# Patient Record
Sex: Female | Born: 1940 | Race: White | Hispanic: No | Marital: Married | Smoking: Former smoker
Health system: Southern US, Community
[De-identification: ages and names within clinical notes are randomized; demographics above are authoritative.]

## PROBLEM LIST (undated history)

## (undated) DIAGNOSIS — J45909 Unspecified asthma, uncomplicated: Secondary | ICD-10-CM

## (undated) DIAGNOSIS — Z8489 Family history of other specified conditions: Secondary | ICD-10-CM

## (undated) DIAGNOSIS — T4145XA Adverse effect of unspecified anesthetic, initial encounter: Secondary | ICD-10-CM

## (undated) DIAGNOSIS — M329 Systemic lupus erythematosus, unspecified: Secondary | ICD-10-CM

## (undated) DIAGNOSIS — E039 Hypothyroidism, unspecified: Secondary | ICD-10-CM

## (undated) DIAGNOSIS — K219 Gastro-esophageal reflux disease without esophagitis: Secondary | ICD-10-CM

## (undated) DIAGNOSIS — E785 Hyperlipidemia, unspecified: Secondary | ICD-10-CM

## (undated) DIAGNOSIS — I714 Abdominal aortic aneurysm, without rupture, unspecified: Secondary | ICD-10-CM

## (undated) DIAGNOSIS — R112 Nausea with vomiting, unspecified: Secondary | ICD-10-CM

## (undated) DIAGNOSIS — T8859XA Other complications of anesthesia, initial encounter: Secondary | ICD-10-CM

## (undated) DIAGNOSIS — IMO0001 Reserved for inherently not codable concepts without codable children: Secondary | ICD-10-CM

## (undated) DIAGNOSIS — Z9889 Other specified postprocedural states: Secondary | ICD-10-CM

## (undated) DIAGNOSIS — J449 Chronic obstructive pulmonary disease, unspecified: Secondary | ICD-10-CM

## (undated) DIAGNOSIS — M479 Spondylosis, unspecified: Secondary | ICD-10-CM

## (undated) DIAGNOSIS — IMO0002 Reserved for concepts with insufficient information to code with codable children: Secondary | ICD-10-CM

## (undated) DIAGNOSIS — G709 Myoneural disorder, unspecified: Secondary | ICD-10-CM

## (undated) HISTORY — PX: TONSILLECTOMY AND ADENOIDECTOMY: SUR1326

## (undated) HISTORY — DX: Hyperlipidemia, unspecified: E78.5

## (undated) HISTORY — DX: Abdominal aortic aneurysm, without rupture, unspecified: I71.40

## (undated) HISTORY — DX: Spondylosis, unspecified: M47.9

## (undated) HISTORY — DX: Abdominal aortic aneurysm, without rupture: I71.4

## (undated) HISTORY — PX: JOINT REPLACEMENT: SHX530

## (undated) HISTORY — DX: Unspecified asthma, uncomplicated: J45.909

## (undated) HISTORY — DX: Chronic obstructive pulmonary disease, unspecified: J44.9

## (undated) HISTORY — PX: CATARACT EXTRACTION: SUR2

## (undated) HISTORY — DX: Systemic lupus erythematosus, unspecified: M32.9

## (undated) HISTORY — PX: ABDOMINAL HYSTERECTOMY: SHX81

---

## 1997-08-30 ENCOUNTER — Other Ambulatory Visit: Admission: RE | Admit: 1997-08-30 | Discharge: 1997-08-30 | Payer: Self-pay | Admitting: Dermatology

## 1998-10-24 ENCOUNTER — Ambulatory Visit (HOSPITAL_COMMUNITY): Admission: RE | Admit: 1998-10-24 | Discharge: 1998-10-24 | Payer: Self-pay | Admitting: Family Medicine

## 1998-10-24 ENCOUNTER — Encounter: Payer: Self-pay | Admitting: Family Medicine

## 2000-05-11 HISTORY — PX: BACK SURGERY: SHX140

## 2000-05-20 ENCOUNTER — Encounter: Payer: Self-pay | Admitting: Specialist

## 2000-05-20 ENCOUNTER — Observation Stay (HOSPITAL_COMMUNITY): Admission: RE | Admit: 2000-05-20 | Discharge: 2000-05-22 | Payer: Self-pay | Admitting: Specialist

## 2000-07-30 ENCOUNTER — Encounter: Payer: Self-pay | Admitting: Neurosurgery

## 2000-07-30 ENCOUNTER — Ambulatory Visit (HOSPITAL_COMMUNITY): Admission: RE | Admit: 2000-07-30 | Discharge: 2000-07-30 | Payer: Self-pay | Admitting: Neurosurgery

## 2000-08-13 ENCOUNTER — Encounter: Payer: Self-pay | Admitting: Neurosurgery

## 2000-08-13 ENCOUNTER — Encounter: Admission: RE | Admit: 2000-08-13 | Discharge: 2000-08-13 | Payer: Self-pay | Admitting: Neurosurgery

## 2000-08-27 ENCOUNTER — Encounter: Payer: Self-pay | Admitting: Neurosurgery

## 2000-08-27 ENCOUNTER — Encounter: Admission: RE | Admit: 2000-08-27 | Discharge: 2000-08-27 | Payer: Self-pay | Admitting: Neurosurgery

## 2000-09-06 ENCOUNTER — Encounter: Admission: RE | Admit: 2000-09-06 | Discharge: 2000-09-06 | Payer: Self-pay | Admitting: Family Medicine

## 2000-09-06 ENCOUNTER — Encounter: Payer: Self-pay | Admitting: Family Medicine

## 2000-09-23 ENCOUNTER — Encounter: Payer: Self-pay | Admitting: Family Medicine

## 2000-09-23 ENCOUNTER — Ambulatory Visit (HOSPITAL_COMMUNITY): Admission: RE | Admit: 2000-09-23 | Discharge: 2000-09-23 | Payer: Self-pay | Admitting: Family Medicine

## 2000-09-28 ENCOUNTER — Encounter: Payer: Self-pay | Admitting: Family Medicine

## 2000-09-28 ENCOUNTER — Encounter: Admission: RE | Admit: 2000-09-28 | Discharge: 2000-09-28 | Payer: Self-pay | Admitting: Family Medicine

## 2000-10-22 ENCOUNTER — Encounter: Admission: RE | Admit: 2000-10-22 | Discharge: 2000-10-22 | Payer: Self-pay | Admitting: Neurosurgery

## 2000-10-22 ENCOUNTER — Encounter: Payer: Self-pay | Admitting: Neurosurgery

## 2000-11-30 ENCOUNTER — Inpatient Hospital Stay (HOSPITAL_COMMUNITY): Admission: AD | Admit: 2000-11-30 | Discharge: 2000-12-03 | Payer: Self-pay | Admitting: Neurosurgery

## 2000-11-30 ENCOUNTER — Encounter: Payer: Self-pay | Admitting: Neurosurgery

## 2000-12-13 ENCOUNTER — Encounter: Payer: Self-pay | Admitting: Neurosurgery

## 2000-12-13 ENCOUNTER — Encounter: Admission: RE | Admit: 2000-12-13 | Discharge: 2000-12-13 | Payer: Self-pay | Admitting: Neurosurgery

## 2001-04-04 ENCOUNTER — Encounter: Admission: RE | Admit: 2001-04-04 | Discharge: 2001-05-23 | Payer: Self-pay | Admitting: Neurosurgery

## 2001-08-03 ENCOUNTER — Ambulatory Visit (HOSPITAL_COMMUNITY): Admission: RE | Admit: 2001-08-03 | Discharge: 2001-08-03 | Payer: Self-pay | Admitting: Gastroenterology

## 2001-08-10 ENCOUNTER — Encounter: Admission: RE | Admit: 2001-08-10 | Discharge: 2001-08-10 | Payer: Self-pay

## 2001-09-26 ENCOUNTER — Encounter: Admission: RE | Admit: 2001-09-26 | Discharge: 2001-09-26 | Payer: Self-pay | Admitting: Neurosurgery

## 2001-09-26 ENCOUNTER — Encounter: Payer: Self-pay | Admitting: Neurosurgery

## 2002-10-26 ENCOUNTER — Emergency Department (HOSPITAL_COMMUNITY): Admission: EM | Admit: 2002-10-26 | Discharge: 2002-10-26 | Payer: Self-pay

## 2002-11-21 ENCOUNTER — Encounter: Admission: RE | Admit: 2002-11-21 | Discharge: 2002-11-21 | Payer: Self-pay | Admitting: Family Medicine

## 2002-11-21 ENCOUNTER — Encounter: Payer: Self-pay | Admitting: Family Medicine

## 2004-01-31 ENCOUNTER — Ambulatory Visit (HOSPITAL_COMMUNITY): Admission: RE | Admit: 2004-01-31 | Discharge: 2004-01-31 | Payer: Self-pay | Admitting: Neurosurgery

## 2004-04-09 ENCOUNTER — Ambulatory Visit (HOSPITAL_COMMUNITY): Admission: RE | Admit: 2004-04-09 | Discharge: 2004-04-09 | Payer: Self-pay | Admitting: *Deleted

## 2005-07-13 ENCOUNTER — Ambulatory Visit (HOSPITAL_COMMUNITY): Admission: RE | Admit: 2005-07-13 | Discharge: 2005-07-13 | Payer: Self-pay | Admitting: Family Medicine

## 2005-10-08 ENCOUNTER — Other Ambulatory Visit: Admission: RE | Admit: 2005-10-08 | Discharge: 2005-10-08 | Payer: Self-pay | Admitting: Obstetrics and Gynecology

## 2006-11-01 ENCOUNTER — Ambulatory Visit (HOSPITAL_COMMUNITY): Admission: RE | Admit: 2006-11-01 | Discharge: 2006-11-01 | Payer: Self-pay | Admitting: Family Medicine

## 2007-08-03 ENCOUNTER — Encounter: Admission: RE | Admit: 2007-08-03 | Discharge: 2007-08-03 | Payer: Self-pay | Admitting: Neurosurgery

## 2007-08-26 ENCOUNTER — Encounter: Admission: RE | Admit: 2007-08-26 | Discharge: 2007-08-26 | Payer: Self-pay | Admitting: Orthopedic Surgery

## 2008-05-07 ENCOUNTER — Other Ambulatory Visit: Admission: RE | Admit: 2008-05-07 | Discharge: 2008-05-07 | Payer: Self-pay | Admitting: Obstetrics and Gynecology

## 2009-02-28 ENCOUNTER — Ambulatory Visit (HOSPITAL_COMMUNITY): Admission: RE | Admit: 2009-02-28 | Discharge: 2009-02-28 | Payer: Self-pay | Admitting: Obstetrics and Gynecology

## 2009-12-19 ENCOUNTER — Encounter: Admission: RE | Admit: 2009-12-19 | Discharge: 2009-12-19 | Payer: Self-pay | Admitting: Neurosurgery

## 2010-03-27 ENCOUNTER — Ambulatory Visit (HOSPITAL_COMMUNITY): Admission: RE | Admit: 2010-03-27 | Discharge: 2010-03-27 | Payer: Self-pay | Admitting: Family Medicine

## 2010-05-23 ENCOUNTER — Ambulatory Visit
Admission: RE | Admit: 2010-05-23 | Discharge: 2010-05-23 | Payer: Self-pay | Source: Home / Self Care | Attending: Vascular Surgery | Admitting: Vascular Surgery

## 2010-05-23 ENCOUNTER — Ambulatory Visit: Admit: 2010-05-23 | Payer: Self-pay | Admitting: Vascular Surgery

## 2010-05-31 NOTE — Procedures (Unsigned)
DUPLEX ULTRASOUND OF ABDOMINAL AORTA  INDICATION:  Aortic dilatation.  HISTORY: Diabetes:  No. Cardiac:  No. Hypertension:  No. Smoking:  No. Connective Tissue Disorder: Family History:  No. Previous Surgery:  No. Other:  Hyperlipidemia.  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  ___________________________________________ Fransisco Hertz, MD  EM/MEDQ  D:  05/23/2010  T:  05/23/2010  Job:  [id number]

## 2010-06-01 ENCOUNTER — Encounter: Payer: Self-pay | Admitting: Neurosurgery

## 2010-09-23 NOTE — Assessment & Plan Note (Signed)
OFFICE VISIT   Janet Giles, Janet Giles A  DOB:  24-Jan-1941                                       05/23/2010  ZOXWR#:[id number]   This is a new patient consultation.  The referral was from Dr. Trey Sailors.  The patient's primary care physician is Dr. Clovis Riley with [facility].   REASON FOR CONSULTATION:  Abdominal aortic enlargement.   HISTORY OF PRESENT ILLNESS:  This is a 70 year old female who presents  with chief complaint of pelvic pain.  She was apparently being evaluated  by Dr. Channing Mutters with [facility] for degenerative  disease in her back and had obtained an MRI.  This demonstrated a  dilatation in the aorta of 2.8 cm.  The patient notes her pain is  described as a vague sense of pain and pressure in her buttocks.  She  denies any frank pain radiating from her abdomen or her back per se.  Does note the previous history of fusion at the L4-L5 level and has also  previously undergone a partial hysterectomy.  She has no known family  history of aneurysmal disease and there is no history of connective  tissue disorders.  Her aneurysmal risk factors include her elevated age  of 78.  She also has some hyperlipidemia but does not have hypertension  and quit smoking in 2001.   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   PAST SURGICAL HISTORY:  She has had a tonsillectomy, adenoidectomy,  partial hysterectomy and then a fusion of L4-L5.   SOCIAL HISTORY:  She quit smoking in 2001 but had prior to that a 40  pack year history of smoking.  Denies any alcohol or illicit drug use.   FAMILY HISTORY:  Her father died at 6 of unknown reasons.  Mother had  diabetes, Alzheimer's and coronary artery disease and died related to  heart complications.  She had a brother and sister that both had  coronary artery disease and myocardial infarctions.   MEDICATIONS:  Included Estrace, lovastatin, gabapentin, Synthroid,  hydrocodone, calcium with vitamin D.   ALLERGIES:  No known drug allergies listed on the chart.   REVIEW OF SYSTEMS:  Demonstrated removal of varicose veins in both legs,  arthritis joint pain and lupus.  Otherwise the rest of her review of  systems was noted on the chart to be negative.   PHYSICAL EXAMINATION:  Vital signs:  Blood pressure of 125/77, heart  rate of 69, respirations 12, satting 98% on room air.  General:  Well-developed, well-nourished, no apparent distress, alert  and oriented x3.  Head:  Normocephalic, atraumatic.  ENT:  Oropharynx without any erythema or exudate.  Nares with erythema  without drainage.  Hearing grossly intact.  Eyes:  Pupils were equal, round, reactive to light.  Extraocular  movements were intact.  Neck:  Had a supple neck with no nuchal rigidity.  There was no obvious  lymphadenopathy.  Pulmonary:  Symmetric expansion, good air movement.  No rales, rhonchi  or wheezing.  Cardiac:  Regular rate and rhythm.  Normal S1-S2.  No murmurs, rubs,  thrills or gallops.  Vascular:  She had easily palpable pulses throughout all extremities.  Carotid bruits were not present.  The aorta was palpable and within  normal limits.  Abdomen:  Soft abdomen, nontender, no guarding or rebound, no  hepatosplenomegaly.  No obvious masses.  Musculoskeletal:  She had 5/5 strength in all extremities.  Both lower  legs demonstrate multiple spider veins but no frank ulcers or any type  of varicosities.  There is also no sign of any type of gangrene.  Neuro:  Motor strength was as above.  Cranial nerves II-XII were intact.  Sensation grossly intact all extremities.  Psychiatric:  Judgment was intact.  Mood and affect were appropriate for  clinical situation.  Skin:  The extremities were as listed above.  Otherwise I did not note  any specific rashes.  Lymphatic:  There was no cervical, axillary or  inguinal lymphadenopathy.   I reviewed about 5 pages from the referral and also reviewed her MRI.  It does demonstrate an area of ectasia in the aorta with no signs  concerning for possible rupture or any type of inflammatory changes.   She also had noninvasive vascular imaging completed.  A duplex  ultrasound abdominal aorta demonstrates maximal diameter distally at 2.6  x 2.4 cm.   MEDICAL DECISION MAKING:  This is a 70 year old female who presents with  primary complaint that sounds to be like a chronic pelvic pain syndrome.  In discussing in depth, her medical history with her, it appears she has  gone through a significant amount of workup already for this.  Given her  also previous history of what sounds like endovenous ablation of her  saphenous vein there is a possibility she may have actually incompetent  pelvic veins that may be accounting for some type of pelvic congestion  syndrome that would be a possible explanation for her chronic pelvic  pain.  The normal study for this would be an MRV or a venogram.  I am  going to have to investigate further whether or not this particular  study can be easily obtained in [place].  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  She would be  on a q.2 year surveillance interval for her particular size.  I talked  with her the risk of complications from this aortic ectasia would be  less than 1% risk of rupture in a year which is far less than the risk  of any type of intervention at this point.  So I will investigate  further whether or not there is an option in terms of further  investigation of her pelvic veins and I will give her a call and try to  schedule either MRV or a pelvic venogram for this patient depending on  availability of the MRV.   I appreciate being given the opportunity to participate in this  patient's care.     Fransisco Hertz, MD  Electronically Signed   BLC/MEDQ  D:   05/23/2010  T:  05/23/2010  Job:  [id number]   cc:   L. Lupe Carney, M.D.  Payton Doughty, M.D.

## 2010-09-26 NOTE — H&P (Signed)
[place]  Patient:    Janet Giles, Janet Giles                      MRN: [id number] Adm. Date:  11/30/00 Attending:  Payton Doughty, M.D.                         History and Physical  ADMISSION DIAGNOSIS:  Lumbar spondylosis.  HISTORY:  This 70 year old, right-handed white female had back pain several years ago.  She participated in an Advil regimen with some resolution.  Last July she was sitting in a chair and had her back start feeling badly.  She got out of the chair and has been feeling bad ever since.  She has low back pain, not particularly activity-related.  It gets down in her legs, and it is hard for her stand in one place.  She has numbness in both feet.  She had been tried with an EMG and nerve conduction study.  This did not demonstrate any hard findings.  Had a seven block which did reproduce some of her pain.  It did not help her much.  Had a myelogram which showed mild narrowing at L5-S1 and an MRI which had similar findings.  She was referred to me.  I visited with her, and tried epidural steroids which were unhelpful.  Based on that a discography was obtained at L4-5 and L5-S1, and she had strongly concordant pain response to pressure, characteristic of degenerative disease. She is therefore admitted for a lumbar fusion.  PAST MEDICAL HISTORY:  Unremarkable for significant diseases.  PAST SURGICAL HISTORY:  She had a tonsillectomy in 1952, a hysterectomy in 1980, a ganglion cyst on her wrist in 1990.  CURRENT MEDICATIONS:  She is currently taking Vicodin t.i.d., Estrace once a day, and Advil six a day for back pain.  ALLERGIES:  No known drug allergies.  SOCIAL HISTORY:  She smokes one pack of cigarettes a day and drinks rarely on a social basis.  FAMILY HISTORY:  Mom died at age 63 of diabetes mellitus and heart disease. Daddy died at age 12 with ulcer disease.  REVIEW OF SYSTEMS:  Remarkable for wearing glasses, back pain, leg  pain, cutaneous lupus.  PHYSICAL EXAMINATION:  HEENT:  Within normal limits.  NECK:  She has a reasonably good range of motion in her neck.  CHEST:  A few crackles.  CARDIAC:  A regular rate and rhythm.  ABDOMEN:  Nontender with no hepatosplenomegaly.  EXTREMITIES:  Without clubbing or cyanosis.  GENITOURINARY:  Deferred.  PERIPHERAL PULSES:  Good.  NEUROLOGIC:  She is awake, alert, and oriented.  Her pupils are equal, round, reactive to light.  Extraocular movements intact.  Facial movement and sensation intact.  Tongue in the midline.  Speech is normal.  Describes no swallowing difficulties.  Shoulder shrug is normal.  Motor examination shows 5/5 strength throughout the upper and lower extremities without a sensory deficit when seated.  When she stands she gets numbness in both feet in a strong dermatomal pattern of L5 and S1.  Reflexes 2 at the knees, 1 at the ankles.  Straight leg raising bilaterally positive.  She has degenerative changes at L5-S1 with marked narrowing of the disk space, facet arthropathy, and an MRI demonstrates biforaminal narrowing at L5-S1, bowing both 5 roots.  A discography has been reviewed above.  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  PLAN:  A  lumbar laminectomy, diskectomy, posterior lumbar interbody fusion with a Ray threaded fusion cage.  The risks and benefits of this approach have been discussed with her, and she wishes to proceed. DD:  11/30/00 TD:  11/30/00 Job: [id number]

## 2010-09-26 NOTE — Op Note (Signed)
[place]. [facility]  Patient:    Janet Giles, Janet Giles                      MRN: [id number] Proc. Date: 11/30/00 Attending:  Payton Doughty, M.D.                           Operative Report  PREOPERATIVE DIAGNOSIS:  Spondylosis L4-5, L5-S1.  POSTOPERATIVE DIAGNOSIS:  Spondylosis L4-5, L5-S1.  OPERATION PERFORMED:  L4-5, L5-S1 laminectomy and diskectomy, posterior lumbar fusion with Ray threaded fusion cage.  SURGEON:  Payton Doughty, M.D.  ANESTHESIA:  General endotracheal.  PREP:  Sterile Betadine prep and scrub with alcohol wipe.  COMPLICATIONS:  None.  ASSISTANT:  Kyle L. Franky Macho, M.D.  INDICATIONS FOR PROCEDURE:  The patient is a 70 year old right-handed white female with severe spondylosis at L4-5 and L5-S1.  DESCRIPTION OF PROCEDURE:  The patient was taken to the operating room, smoothly anesthetized and intubated, and placed prone on the operating table. Following shave, prep and drape in the usual sterile fashion, the skin was infiltrated with 1% lidocaine 1:400,000 epinephrine.  The skin was incised from the top of L4 to the bottom of S1 and the lamina of L4, L5 and S1 were exposed bilaterally in the subperiosteal plane.  Intraoperative x-ray confirmed correctness of the level.  The pars interarticularis lamina and inferior facet of L4 and L5 and the superior facets of L5 and S1 were removed bilaterally and the bone set aside for grafting.  The ligamentum flavum was removed at each level and the L4, L5 and S1 nerve roots were carefully dissected free and decompressed.   At both levels, but most particularly at 5-1, the lateral recess was extremely owing to facet hypertrophy.  Following complete decompression of the nerve roots, diskectomy was carried out at each level.  At L5-S1 12 x 21 mm Ray threaded fusion cages were placed and at L4-5, 14 mm cages were placed.  Intraoperative x-ray showed good placement of cages. They were packed with bone graft  harvested from the facet joints and capped. The wound was irrigated and hemostasis assured.  The fascia was reapproximated with 0 Vicryl in interrupted fashion and subcutaneous tissues were reapproximated with 0 Vicryl in interrupted fashion.  Subcuticular tissues reapproximated with 3-0 Vicryl in interrupted fashion.  Skin was closed with 3-0 nylon in running locked fashion. Betadine Telfa dressing was applied and made occlusive with OpSite.  The patient then returned to the recovery room in good condition. DD:  11/30/00 TD:  12/01/00 Job: [id number]

## 2010-09-26 NOTE — Procedures (Signed)
[facility]  Patient:    Janet Giles, Janet Giles Visit Number: [id number] MRN: [id number]          Service Type: Attending:  Verlin Grills, M.D. Dictated by:   Verlin Grills, M.D. Proc. Date: 08/03/01   CC:         Rozanna Boer., M.D.  Dyanne Carrel, M.D.  Payton Doughty, M.D.   Procedure Report  PROCEDURE:  Colonoscopy.  REFERRING PHYSICIAN:  1. Dyanne Carrel, M.D.  2. Payton Doughty, M.D.  3. Rozanna Boer., M.D.  INDICATION FOR PROCEDURE:  Ms. Annmargaret Decaprio is a 70 year old female born [birth year]. Ms. Zahn is due for her first screening colonoscopy with polypectomy to prevent colon cancer. Ms. Heskett brother was diagnosed with colon cancer at age 107. Ms. Borger viewed my colonoscopy education film in my office and I discussed with her the complications associated with colonoscopy and polypectomy including a 15/1000 risk of bleeding and 08/998 risk of colon perforation requiring surgical repair. Ms. Wallman has signed the operative permit.  Ms. Holian is experiencing suprapubic pelvic pain. She was evaluated by Dr. Vic Blackbird who found no significant urologic pathology to explain her suprapubic pain. Ms. Yerian has undergone a total abdominal hysterectomy but her ovaries remain in place. She also has chronic low back pain and possible degenerative disk disease and is followed by Dr. Trey Sailors.  ENDOSCOPIST:  Verlin Grills, M.D.  PREMEDICATION:  Versed 7.5 mg, Demerol 50 mg.  ENDOSCOPE:  Olympus pediatric colonoscope.  DESCRIPTION OF PROCEDURE:  After obtaining informed consent, Ms. Procter was placed in the left lateral decubitus position. I administered intravenous Demerol and intravenous Versed to achieve conscious sedation for the procedure. The patients cardiac rhythm, oxygen saturation and blood pressure were monitored throughout the procedure and documented  in the medical record.  Anal inspection was normal. Digital rectal exam was normal. The Olympus pediatric video colonoscope was introduced into the rectum and easily advanced to the cecum. Colonic preparation for the exam today was excellent.  RECTUM:  Normal.  SIGMOID COLON/DESCENDING COLON:  Normal.  SPLENIC FLEXURE:  Normal.  TRANSVERSE COLON:  Normal.  HEPATIC FLEXURE:  Normal.  ASCENDING COLON:  Normal.  CECUM/ILEOCECAL VALVE:  Normal.  ASSESSMENT:  Normal screening proctocolonoscopy to the cecum. No endoscopic evidence for the presence of colorectal neoplasia. No intestinal disease to explain Ms. Rashad suprapubic pelvic pain.  RECOMMENDATIONS:  I will ask Ms. Stiver to return to Dr. Blair Heys for continued evaluation of her suprapubic pain. She may require a CT scan of the abdomen and pelvis as the next diagnostic test. If abdominal pelvic CT scan is normal, she may require a diagnostic laparoscopy by her gynecologist.  Ms. Kincannon should go a repeat colonoscopy in approximately 5 years. Dictated by:   Verlin Grills, M.D. Attending:  Verlin Grills, M.D. DD:  08/03/01 TD:  08/04/01 Job: [id number]

## 2010-09-26 NOTE — Discharge Summary (Signed)
[place]. [facility]  Patient:    Janet Giles, Janet Giles                    MRN: [id number] Adm. Date:  [id number] Disc. Date: [id number] Attending:  Emeterio Reeve                           Discharge Summary  ADMITTING DIAGNOSIS:  Lumbar spondylosis at lumbar vertebrae-4/5 and lumbar vertebrae-5/sacral vertebrae-1.  DISCHARGE DIAGNOSIS:  Lumbar spondylosis at lumbar vertebrae-4/5 and lumbar vertebrae-5/sacral vertebrae-1.  OPERATION:  L4/5 and L5-S1 laminectomy and discectomy.  Posterior lumbar interbody fusion with Ray cage fusion cage.  COMPLICATIONS:  None.  CONDITION ON DISCHARGE:  Discharged satisfied and well.  HISTORY OF PRESENT ILLNESS:  This is a 70 year old right-handed white lady whose history and physical is recounted in the chart.  She has had back pain for a number of years.  She underwent epidural steroids to no avail. Discography is positive for L4/5 and L5-S1.  She is admitted for fusion. Medically, she is unremarkable.  PHYSICAL EXAMINATION:  GENERAL:  Unremarkable.  NEUROLOGICAL:  Intact.  Positive straight leg raise.  HOSPITAL COURSE:  She was admitted after ascertaining normal laboratory values and underwent a L4/5 and L5-S1 fusion.  Postoperatively, she did well.  The Foley was removed the first day.  She developed some dysesthesias in her legs which were rapidly taken care of with Neurontin.  PCA was discontinued the second day.  She was up and about eating and voiding normally.  Her incision is dry and well healing.  She had some headache which resolved with cessation of PCA Morphine.  She is being discharged home in the care of her family on Vicodin for pain, Neurontin for neuropathic pain, and Nicotine patches.  Her follow-up will be in the [facility] office in about 10 days for suture removal. DD:  12/03/00 TD:  12/04/00 Job: [id number]

## 2011-04-20 ENCOUNTER — Other Ambulatory Visit (HOSPITAL_COMMUNITY): Payer: Self-pay | Admitting: Family Medicine

## 2011-04-20 DIAGNOSIS — Z1231 Encounter for screening mammogram for malignant neoplasm of breast: Secondary | ICD-10-CM

## 2011-04-21 ENCOUNTER — Other Ambulatory Visit: Payer: Self-pay | Admitting: Family Medicine

## 2011-04-21 ENCOUNTER — Other Ambulatory Visit (HOSPITAL_COMMUNITY): Payer: Self-pay | Admitting: Family Medicine

## 2011-04-21 DIAGNOSIS — N644 Mastodynia: Secondary | ICD-10-CM

## 2011-05-06 ENCOUNTER — Ambulatory Visit
Admission: RE | Admit: 2011-05-06 | Discharge: 2011-05-06 | Disposition: A | Discharge status: Home / Self Care | Payer: No Typology Code available for payment source | Source: Ambulatory Visit | Attending: Family Medicine | Admitting: Family Medicine

## 2011-05-06 ENCOUNTER — Other Ambulatory Visit: Payer: Self-pay | Admitting: Family Medicine

## 2011-05-06 DIAGNOSIS — N644 Mastodynia: Secondary | ICD-10-CM

## 2011-05-25 ENCOUNTER — Ambulatory Visit (HOSPITAL_COMMUNITY): Payer: Self-pay

## 2011-07-13 DIAGNOSIS — Z79899 Other long term (current) drug therapy: Secondary | ICD-10-CM | POA: Diagnosis not present

## 2011-07-13 DIAGNOSIS — E7800 Pure hypercholesterolemia, unspecified: Secondary | ICD-10-CM | POA: Diagnosis not present

## 2011-07-13 DIAGNOSIS — E039 Hypothyroidism, unspecified: Secondary | ICD-10-CM | POA: Diagnosis not present

## 2011-07-13 DIAGNOSIS — M899 Disorder of bone, unspecified: Secondary | ICD-10-CM | POA: Diagnosis not present

## 2011-07-13 DIAGNOSIS — M542 Cervicalgia: Secondary | ICD-10-CM | POA: Diagnosis not present

## 2011-07-13 DIAGNOSIS — M949 Disorder of cartilage, unspecified: Secondary | ICD-10-CM | POA: Diagnosis not present

## 2011-07-13 DIAGNOSIS — R079 Chest pain, unspecified: Secondary | ICD-10-CM | POA: Diagnosis not present

## 2011-09-02 DIAGNOSIS — M899 Disorder of bone, unspecified: Secondary | ICD-10-CM | POA: Diagnosis not present

## 2011-09-02 DIAGNOSIS — M949 Disorder of cartilage, unspecified: Secondary | ICD-10-CM | POA: Diagnosis not present

## 2011-10-14 ENCOUNTER — Other Ambulatory Visit (HOSPITAL_COMMUNITY): Payer: Self-pay | Admitting: Gastroenterology

## 2011-10-14 DIAGNOSIS — Z1211 Encounter for screening for malignant neoplasm of colon: Secondary | ICD-10-CM | POA: Diagnosis not present

## 2011-10-14 DIAGNOSIS — K5790 Diverticulosis of intestine, part unspecified, without perforation or abscess without bleeding: Secondary | ICD-10-CM

## 2011-10-14 DIAGNOSIS — Z538 Procedure and treatment not carried out for other reasons: Secondary | ICD-10-CM | POA: Diagnosis not present

## 2011-10-14 DIAGNOSIS — K5730 Diverticulosis of large intestine without perforation or abscess without bleeding: Secondary | ICD-10-CM | POA: Diagnosis not present

## 2011-10-14 DIAGNOSIS — K5669 Other intestinal obstruction: Secondary | ICD-10-CM | POA: Diagnosis not present

## 2011-10-14 DIAGNOSIS — Z800 Family history of malignant neoplasm of digestive organs: Secondary | ICD-10-CM | POA: Diagnosis not present

## 2011-11-25 ENCOUNTER — Ambulatory Visit (HOSPITAL_COMMUNITY)
Admission: RE | Admit: 2011-11-25 | Discharge: 2011-11-25 | Disposition: A | Discharge status: Home / Self Care | Payer: Medicare Other | Source: Ambulatory Visit | Attending: Gastroenterology | Admitting: Gastroenterology

## 2011-11-25 DIAGNOSIS — R948 Abnormal results of function studies of other organs and systems: Secondary | ICD-10-CM | POA: Diagnosis not present

## 2011-11-25 DIAGNOSIS — K5730 Diverticulosis of large intestine without perforation or abscess without bleeding: Secondary | ICD-10-CM | POA: Diagnosis not present

## 2011-11-25 DIAGNOSIS — K5790 Diverticulosis of intestine, part unspecified, without perforation or abscess without bleeding: Secondary | ICD-10-CM

## 2011-12-17 DIAGNOSIS — H00019 Hordeolum externum unspecified eye, unspecified eyelid: Secondary | ICD-10-CM | POA: Diagnosis not present

## 2011-12-17 DIAGNOSIS — L259 Unspecified contact dermatitis, unspecified cause: Secondary | ICD-10-CM | POA: Diagnosis not present

## 2012-03-28 ENCOUNTER — Other Ambulatory Visit: Payer: Self-pay | Admitting: Family Medicine

## 2012-03-28 DIAGNOSIS — Z1231 Encounter for screening mammogram for malignant neoplasm of breast: Secondary | ICD-10-CM

## 2012-03-28 DIAGNOSIS — Z803 Family history of malignant neoplasm of breast: Secondary | ICD-10-CM

## 2012-05-17 ENCOUNTER — Ambulatory Visit
Admission: RE | Admit: 2012-05-17 | Discharge: 2012-05-17 | Disposition: A | Discharge status: Home / Self Care | Payer: Medicare Other | Source: Ambulatory Visit | Attending: Family Medicine | Admitting: Family Medicine

## 2012-05-17 DIAGNOSIS — Z1231 Encounter for screening mammogram for malignant neoplasm of breast: Secondary | ICD-10-CM | POA: Diagnosis not present

## 2012-05-17 DIAGNOSIS — Z803 Family history of malignant neoplasm of breast: Secondary | ICD-10-CM

## 2012-06-02 DIAGNOSIS — M169 Osteoarthritis of hip, unspecified: Secondary | ICD-10-CM | POA: Diagnosis not present

## 2012-07-19 DIAGNOSIS — M47817 Spondylosis without myelopathy or radiculopathy, lumbosacral region: Secondary | ICD-10-CM | POA: Diagnosis not present

## 2012-07-27 DIAGNOSIS — Z79899 Other long term (current) drug therapy: Secondary | ICD-10-CM | POA: Diagnosis not present

## 2012-07-27 DIAGNOSIS — Z91038 Other insect allergy status: Secondary | ICD-10-CM | POA: Diagnosis not present

## 2012-07-27 DIAGNOSIS — M899 Disorder of bone, unspecified: Secondary | ICD-10-CM | POA: Diagnosis not present

## 2012-07-27 DIAGNOSIS — E039 Hypothyroidism, unspecified: Secondary | ICD-10-CM | POA: Diagnosis not present

## 2012-07-27 DIAGNOSIS — E7800 Pure hypercholesterolemia, unspecified: Secondary | ICD-10-CM | POA: Diagnosis not present

## 2012-10-11 DIAGNOSIS — E039 Hypothyroidism, unspecified: Secondary | ICD-10-CM | POA: Diagnosis not present

## 2012-10-20 DIAGNOSIS — M47817 Spondylosis without myelopathy or radiculopathy, lumbosacral region: Secondary | ICD-10-CM | POA: Diagnosis not present

## 2012-12-13 DIAGNOSIS — L820 Inflamed seborrheic keratosis: Secondary | ICD-10-CM | POA: Diagnosis not present

## 2012-12-13 DIAGNOSIS — L570 Actinic keratosis: Secondary | ICD-10-CM | POA: Diagnosis not present

## 2012-12-13 DIAGNOSIS — I872 Venous insufficiency (chronic) (peripheral): Secondary | ICD-10-CM | POA: Diagnosis not present

## 2012-12-13 DIAGNOSIS — L819 Disorder of pigmentation, unspecified: Secondary | ICD-10-CM | POA: Diagnosis not present

## 2012-12-13 DIAGNOSIS — L738 Other specified follicular disorders: Secondary | ICD-10-CM | POA: Diagnosis not present

## 2013-01-11 DIAGNOSIS — M79609 Pain in unspecified limb: Secondary | ICD-10-CM | POA: Diagnosis not present

## 2013-01-11 DIAGNOSIS — I8310 Varicose veins of unspecified lower extremity with inflammation: Secondary | ICD-10-CM | POA: Diagnosis not present

## 2013-02-03 DIAGNOSIS — M47817 Spondylosis without myelopathy or radiculopathy, lumbosacral region: Secondary | ICD-10-CM | POA: Diagnosis not present

## 2013-02-08 DIAGNOSIS — M79609 Pain in unspecified limb: Secondary | ICD-10-CM | POA: Diagnosis not present

## 2013-02-08 DIAGNOSIS — I8310 Varicose veins of unspecified lower extremity with inflammation: Secondary | ICD-10-CM | POA: Diagnosis not present

## 2013-02-28 DIAGNOSIS — M25569 Pain in unspecified knee: Secondary | ICD-10-CM | POA: Diagnosis not present

## 2013-02-28 DIAGNOSIS — M169 Osteoarthritis of hip, unspecified: Secondary | ICD-10-CM | POA: Diagnosis not present

## 2013-03-06 DIAGNOSIS — M25559 Pain in unspecified hip: Secondary | ICD-10-CM | POA: Diagnosis not present

## 2013-03-28 ENCOUNTER — Telehealth: Payer: Self-pay | Admitting: Vascular Surgery

## 2013-03-31 DIAGNOSIS — I8310 Varicose veins of unspecified lower extremity with inflammation: Secondary | ICD-10-CM | POA: Diagnosis not present

## 2013-03-31 DIAGNOSIS — M79609 Pain in unspecified limb: Secondary | ICD-10-CM | POA: Diagnosis not present

## 2013-03-31 NOTE — Telephone Encounter (Signed)
l/v/m for patient to call back to r/s her 2 yr  fu (she was due in 01-14) for AAA duplex and dr. Imogene Burn as per Massie Maroon 05-23-10

## 2013-04-11 ENCOUNTER — Other Ambulatory Visit: Payer: Self-pay

## 2013-04-11 DIAGNOSIS — Z1231 Encounter for screening mammogram for malignant neoplasm of breast: Secondary | ICD-10-CM

## 2013-04-12 DIAGNOSIS — I8310 Varicose veins of unspecified lower extremity with inflammation: Secondary | ICD-10-CM | POA: Diagnosis not present

## 2013-04-12 DIAGNOSIS — M79609 Pain in unspecified limb: Secondary | ICD-10-CM | POA: Diagnosis not present

## 2013-05-09 DIAGNOSIS — M47817 Spondylosis without myelopathy or radiculopathy, lumbosacral region: Secondary | ICD-10-CM | POA: Diagnosis not present

## 2013-05-15 ENCOUNTER — Other Ambulatory Visit: Payer: Self-pay | Admitting: *Deleted

## 2013-05-15 DIAGNOSIS — I714 Abdominal aortic aneurysm, without rupture, unspecified: Secondary | ICD-10-CM

## 2013-05-17 DIAGNOSIS — M79609 Pain in unspecified limb: Secondary | ICD-10-CM | POA: Diagnosis not present

## 2013-05-17 DIAGNOSIS — I8310 Varicose veins of unspecified lower extremity with inflammation: Secondary | ICD-10-CM | POA: Diagnosis not present

## 2013-05-19 ENCOUNTER — Ambulatory Visit: Payer: Medicare Other | Admitting: Vascular Surgery

## 2013-05-19 ENCOUNTER — Other Ambulatory Visit (HOSPITAL_COMMUNITY): Payer: Medicare Other

## 2013-05-22 ENCOUNTER — Ambulatory Visit
Admission: RE | Admit: 2013-05-22 | Discharge: 2013-05-22 | Disposition: A | Discharge status: Home / Self Care | Payer: Medicare Other | Source: Ambulatory Visit

## 2013-05-22 DIAGNOSIS — Z1231 Encounter for screening mammogram for malignant neoplasm of breast: Secondary | ICD-10-CM

## 2013-05-25 ENCOUNTER — Encounter: Payer: Self-pay | Admitting: Vascular Surgery

## 2013-05-26 ENCOUNTER — Ambulatory Visit (INDEPENDENT_AMBULATORY_CARE_PROVIDER_SITE_OTHER): Payer: Medicare Other | Admitting: Vascular Surgery

## 2013-05-26 ENCOUNTER — Ambulatory Visit (HOSPITAL_COMMUNITY)
Admission: RE | Admit: 2013-05-26 | Discharge: 2013-05-26 | Disposition: A | Discharge status: Home / Self Care | Payer: Medicare Other | Source: Ambulatory Visit | Attending: Vascular Surgery | Admitting: Vascular Surgery

## 2013-05-26 ENCOUNTER — Encounter: Payer: Self-pay | Admitting: Vascular Surgery

## 2013-05-26 VITALS — BP 126/70 | HR 83 | Ht 64.0 in | Wt 151.4 lb

## 2013-05-26 DIAGNOSIS — I714 Abdominal aortic aneurysm, without rupture, unspecified: Secondary | ICD-10-CM | POA: Diagnosis not present

## 2013-05-26 DIAGNOSIS — I77811 Abdominal aortic ectasia: Secondary | ICD-10-CM | POA: Insufficient documentation

## 2013-05-26 HISTORY — DX: Abdominal aortic ectasia: I77.811

## 2013-05-26 NOTE — Progress Notes (Signed)
    Established Abdominal Aortic Ectasia  History of Present Illness  The patient is a 73 y.o. (08/18/1940) female who presents with chief complaint: follow up for abdominal aortic ectasia.  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  The patient does chronic back pain and has undergone back procedures in the past.  She has some abdominal pain.  The patient is not a smoker.  She has never had any embolic symptoms.  Pt is getting sclerotherapy with [place].  The patient's PMH, PSH, SH, FamHx, Med, and Allergies are unchanged from 09/09/10.  On ROS today: no claudication, no embolic signs distally  Physical Examination  Filed Vitals:   05/26/13 0950  BP: 126/70  Pulse: 83  Height: 5\' 4"  (1.626 m)  Weight: 151 lb 6.4 oz (68.675 kg)  SpO2: 100%   Body mass index is 25.98 kg/(m^2).  General: A&O x 3, WD, thin  Pulmonary: Sym exp, good air movt, CTAB, no rales, rhonchi, & wheezing  Cardiac: RRR, Nl S1, S2, no Murmurs, rubs or gallops  Vascular: Vessel Right Left  Radial Palpable Palpable  Brachial Palpable Palpable  Carotid Palpable, without bruit Palpable, without bruit  Aorta Not palpable N/A  Femoral Palpable Palpable  Popliteal Not palpable Not palpable  PT Not Palpable Not Palpable  DP Faintly Palpable Faintly Palpable   Gastrointestinal: soft, NTND, -G/R, - HSM, - masses, - CVAT B, no palpable AAA  Musculoskeletal: M/S 5/5 throughout , Extremities without ischemic changes , no edema in bilateral feet, no LDS , few spider veins  Neurologic: Pain and light touch intact in extremities , Motor exam as listed above  Non-Invasive Vascular Imaging  AAA Duplex (05/26/2013)  Previous size: 2.60 cm x 2.42 cm (Date: 05/23/10)  Current size:  2.58 cm x 2.54 cm (Date: 05/26/2013)  Medical Decision Making  The patient is a 73 y.o. female who presents with: asymptomatic aortic ectasia with stable size.   Based on this patient's exam and  diagnostic studies, she needs q2 year aortic duplex.  If no further growth, can consider discontinued AAA duplex.  The threshold for repair is AAA size > 5.5 cm, growth > 1 cm/yr, and symptomatic status.  I emphasized the importance of maximal medical management including strict control of blood pressure, blood glucose, and lipid levels, antiplatelet agents, obtaining regular exercise, and cessation of smoking.    Thank you for allowing [place] to participate in this patient's care.  Adele Barthel, MD [facility] Office: [phone] Pager: [phone]  05/26/2013, 10:23 AM

## 2013-05-26 NOTE — Addendum Note (Signed)
Addended by: Mena Goes on: 05/26/2013 05:06 PM   Modules accepted: Orders

## 2013-08-01 DIAGNOSIS — M47817 Spondylosis without myelopathy or radiculopathy, lumbosacral region: Secondary | ICD-10-CM | POA: Diagnosis not present

## 2013-08-03 DIAGNOSIS — M7500 Adhesive capsulitis of unspecified shoulder: Secondary | ICD-10-CM | POA: Diagnosis not present

## 2013-08-04 DIAGNOSIS — E7800 Pure hypercholesterolemia, unspecified: Secondary | ICD-10-CM | POA: Diagnosis not present

## 2013-08-04 DIAGNOSIS — M949 Disorder of cartilage, unspecified: Secondary | ICD-10-CM | POA: Diagnosis not present

## 2013-08-04 DIAGNOSIS — H6120 Impacted cerumen, unspecified ear: Secondary | ICD-10-CM | POA: Diagnosis not present

## 2013-08-04 DIAGNOSIS — M899 Disorder of bone, unspecified: Secondary | ICD-10-CM | POA: Diagnosis not present

## 2013-08-04 DIAGNOSIS — Z91038 Other insect allergy status: Secondary | ICD-10-CM | POA: Diagnosis not present

## 2013-08-04 DIAGNOSIS — E039 Hypothyroidism, unspecified: Secondary | ICD-10-CM | POA: Diagnosis not present

## 2013-08-10 DIAGNOSIS — L570 Actinic keratosis: Secondary | ICD-10-CM | POA: Diagnosis not present

## 2013-08-10 DIAGNOSIS — L821 Other seborrheic keratosis: Secondary | ICD-10-CM | POA: Diagnosis not present

## 2013-08-10 DIAGNOSIS — D239 Other benign neoplasm of skin, unspecified: Secondary | ICD-10-CM | POA: Diagnosis not present

## 2013-08-10 DIAGNOSIS — L819 Disorder of pigmentation, unspecified: Secondary | ICD-10-CM | POA: Diagnosis not present

## 2013-08-10 DIAGNOSIS — L738 Other specified follicular disorders: Secondary | ICD-10-CM | POA: Diagnosis not present

## 2013-08-10 DIAGNOSIS — L678 Other hair color and hair shaft abnormalities: Secondary | ICD-10-CM | POA: Diagnosis not present

## 2013-08-23 DIAGNOSIS — IMO0002 Reserved for concepts with insufficient information to code with codable children: Secondary | ICD-10-CM | POA: Diagnosis not present

## 2013-08-23 DIAGNOSIS — M25519 Pain in unspecified shoulder: Secondary | ICD-10-CM | POA: Diagnosis not present

## 2013-08-23 DIAGNOSIS — M7500 Adhesive capsulitis of unspecified shoulder: Secondary | ICD-10-CM | POA: Diagnosis not present

## 2013-08-31 DIAGNOSIS — IMO0002 Reserved for concepts with insufficient information to code with codable children: Secondary | ICD-10-CM | POA: Diagnosis not present

## 2013-09-26 DIAGNOSIS — IMO0002 Reserved for concepts with insufficient information to code with codable children: Secondary | ICD-10-CM | POA: Diagnosis not present

## 2013-10-18 DIAGNOSIS — E039 Hypothyroidism, unspecified: Secondary | ICD-10-CM | POA: Diagnosis not present

## 2014-01-09 ENCOUNTER — Other Ambulatory Visit (HOSPITAL_COMMUNITY): Payer: Self-pay | Admitting: Respiratory Therapy

## 2014-01-09 ENCOUNTER — Other Ambulatory Visit: Payer: Self-pay | Admitting: Family Medicine

## 2014-01-09 ENCOUNTER — Ambulatory Visit
Admission: RE | Admit: 2014-01-09 | Discharge: 2014-01-09 | Disposition: A | Discharge status: Home / Self Care | Payer: Medicare Other | Source: Ambulatory Visit | Attending: Family Medicine | Admitting: Family Medicine

## 2014-01-09 DIAGNOSIS — R0600 Dyspnea, unspecified: Secondary | ICD-10-CM

## 2014-01-09 DIAGNOSIS — R0602 Shortness of breath: Secondary | ICD-10-CM

## 2014-01-09 DIAGNOSIS — E039 Hypothyroidism, unspecified: Secondary | ICD-10-CM | POA: Diagnosis not present

## 2014-01-12 ENCOUNTER — Ambulatory Visit (HOSPITAL_COMMUNITY)
Admission: RE | Admit: 2014-01-12 | Discharge: 2014-01-12 | Disposition: A | Discharge status: Home / Self Care | Payer: Medicare Other | Source: Ambulatory Visit | Attending: Family Medicine | Admitting: Family Medicine

## 2014-01-12 DIAGNOSIS — R0609 Other forms of dyspnea: Secondary | ICD-10-CM | POA: Diagnosis not present

## 2014-01-12 DIAGNOSIS — R0989 Other specified symptoms and signs involving the circulatory and respiratory systems: Secondary | ICD-10-CM | POA: Diagnosis not present

## 2014-01-12 MED ORDER — ALBUTEROL SULFATE (2.5 MG/3ML) 0.083% IN NEBU
2.5000 mg | INHALATION_SOLUTION | Freq: Once | RESPIRATORY_TRACT | Status: AC
  Administered 2014-01-12: 2.5 mg via RESPIRATORY_TRACT

## 2014-01-17 DIAGNOSIS — L570 Actinic keratosis: Secondary | ICD-10-CM | POA: Diagnosis not present

## 2014-01-17 DIAGNOSIS — L821 Other seborrheic keratosis: Secondary | ICD-10-CM | POA: Diagnosis not present

## 2014-01-21 LAB — PULMONARY FUNCTION TEST
DL/VA % pred: 69 %
DL/VA: 3.33 ml/min/mmHg/L
DLCO unc % pred: 48 %
DLCO unc: 11.79 ml/min/mmHg
FEF 25-75 Post: 0.89 L/sec
FEF 25-75 Pre: 0.64 L/sec
FEF2575-%Change-Post: 38 %
FEF2575-%Pred-Post: 51 %
FEF2575-%Pred-Pre: 37 %
FEV1-%Change-Post: 10 %
FEV1-%Pred-Post: 64 %
FEV1-%Pred-Pre: 58 %
FEV1-Post: 1.39 L
FEV1-Pre: 1.26 L
FEV1FVC-%Change-Post: -5 %
FEV1FVC-%Pred-Pre: 83 %
FEV6-%Change-Post: 14 %
FEV6-%Pred-Post: 83 %
FEV6-%Pred-Pre: 72 %
FEV6-Post: 2.28 L
FEV6-Pre: 1.99 L
FEV6FVC-%Change-Post: -1 %
FEV6FVC-%Pred-Post: 103 %
FEV6FVC-%Pred-Pre: 104 %
FVC-%Change-Post: 16 %
FVC-%Pred-Post: 81 %
FVC-%Pred-Pre: 69 %
FVC-Post: 2.33 L
FVC-Pre: 2.01 L
Post FEV1/FVC ratio: 60 %
Post FEV6/FVC ratio: 98 %
Pre FEV1/FVC ratio: 63 %
Pre FEV6/FVC Ratio: 99 %
RV % pred: 105 %
RV: 2.39 L
TLC % pred: 88 %
TLC: 4.45 L

## 2014-01-25 DIAGNOSIS — M7500 Adhesive capsulitis of unspecified shoulder: Secondary | ICD-10-CM | POA: Diagnosis not present

## 2014-02-06 DIAGNOSIS — M7500 Adhesive capsulitis of unspecified shoulder: Secondary | ICD-10-CM | POA: Diagnosis not present

## 2014-02-07 DIAGNOSIS — M47817 Spondylosis without myelopathy or radiculopathy, lumbosacral region: Secondary | ICD-10-CM | POA: Diagnosis not present

## 2014-02-13 DIAGNOSIS — M7502 Adhesive capsulitis of left shoulder: Secondary | ICD-10-CM | POA: Diagnosis not present

## 2014-02-21 DIAGNOSIS — M7502 Adhesive capsulitis of left shoulder: Secondary | ICD-10-CM | POA: Diagnosis not present

## 2014-02-27 DIAGNOSIS — M7502 Adhesive capsulitis of left shoulder: Secondary | ICD-10-CM | POA: Diagnosis not present

## 2014-03-07 DIAGNOSIS — M7502 Adhesive capsulitis of left shoulder: Secondary | ICD-10-CM | POA: Diagnosis not present

## 2014-03-27 DIAGNOSIS — Z947 Corneal transplant status: Secondary | ICD-10-CM | POA: Diagnosis not present

## 2014-04-23 ENCOUNTER — Other Ambulatory Visit: Payer: Self-pay

## 2014-04-23 DIAGNOSIS — Z1231 Encounter for screening mammogram for malignant neoplasm of breast: Secondary | ICD-10-CM

## 2014-05-16 DIAGNOSIS — M47816 Spondylosis without myelopathy or radiculopathy, lumbar region: Secondary | ICD-10-CM | POA: Diagnosis not present

## 2014-05-24 ENCOUNTER — Ambulatory Visit
Admission: RE | Admit: 2014-05-24 | Discharge: 2014-05-24 | Disposition: A | Discharge status: Home / Self Care | Payer: Medicare Other | Source: Ambulatory Visit

## 2014-05-24 DIAGNOSIS — Z1231 Encounter for screening mammogram for malignant neoplasm of breast: Secondary | ICD-10-CM

## 2014-08-13 DIAGNOSIS — M47816 Spondylosis without myelopathy or radiculopathy, lumbar region: Secondary | ICD-10-CM | POA: Diagnosis not present

## 2014-08-14 DIAGNOSIS — J45909 Unspecified asthma, uncomplicated: Secondary | ICD-10-CM | POA: Diagnosis not present

## 2014-08-14 DIAGNOSIS — E039 Hypothyroidism, unspecified: Secondary | ICD-10-CM | POA: Diagnosis not present

## 2014-08-14 DIAGNOSIS — E780 Pure hypercholesterolemia: Secondary | ICD-10-CM | POA: Diagnosis not present

## 2014-08-14 DIAGNOSIS — J301 Allergic rhinitis due to pollen: Secondary | ICD-10-CM | POA: Diagnosis not present

## 2014-08-14 DIAGNOSIS — Z23 Encounter for immunization: Secondary | ICD-10-CM | POA: Diagnosis not present

## 2014-08-14 DIAGNOSIS — M8580 Other specified disorders of bone density and structure, unspecified site: Secondary | ICD-10-CM | POA: Diagnosis not present

## 2014-09-14 DIAGNOSIS — L603 Nail dystrophy: Secondary | ICD-10-CM | POA: Diagnosis not present

## 2014-09-14 DIAGNOSIS — D1801 Hemangioma of skin and subcutaneous tissue: Secondary | ICD-10-CM | POA: Diagnosis not present

## 2014-09-14 DIAGNOSIS — L814 Other melanin hyperpigmentation: Secondary | ICD-10-CM | POA: Diagnosis not present

## 2014-09-14 DIAGNOSIS — L821 Other seborrheic keratosis: Secondary | ICD-10-CM | POA: Diagnosis not present

## 2014-09-14 DIAGNOSIS — L570 Actinic keratosis: Secondary | ICD-10-CM | POA: Diagnosis not present

## 2014-09-19 DIAGNOSIS — M25511 Pain in right shoulder: Secondary | ICD-10-CM | POA: Diagnosis not present

## 2014-10-15 DIAGNOSIS — E039 Hypothyroidism, unspecified: Secondary | ICD-10-CM | POA: Diagnosis not present

## 2014-10-30 DIAGNOSIS — M7061 Trochanteric bursitis, right hip: Secondary | ICD-10-CM | POA: Diagnosis not present

## 2014-11-08 DIAGNOSIS — M47816 Spondylosis without myelopathy or radiculopathy, lumbar region: Secondary | ICD-10-CM | POA: Diagnosis not present

## 2014-11-14 ENCOUNTER — Other Ambulatory Visit: Payer: Self-pay | Admitting: Neurosurgery

## 2014-11-14 ENCOUNTER — Ambulatory Visit
Admission: RE | Admit: 2014-11-14 | Discharge: 2014-11-14 | Disposition: A | Discharge status: Home / Self Care | Payer: Medicare Other | Source: Ambulatory Visit | Attending: Neurosurgery | Admitting: Neurosurgery

## 2014-11-14 DIAGNOSIS — Z981 Arthrodesis status: Secondary | ICD-10-CM | POA: Diagnosis not present

## 2014-11-14 DIAGNOSIS — M47816 Spondylosis without myelopathy or radiculopathy, lumbar region: Secondary | ICD-10-CM

## 2014-11-14 DIAGNOSIS — M545 Low back pain: Secondary | ICD-10-CM | POA: Diagnosis not present

## 2014-12-06 DIAGNOSIS — M1611 Unilateral primary osteoarthritis, right hip: Secondary | ICD-10-CM | POA: Diagnosis not present

## 2014-12-06 DIAGNOSIS — Z9889 Other specified postprocedural states: Secondary | ICD-10-CM | POA: Diagnosis not present

## 2014-12-06 DIAGNOSIS — M5441 Lumbago with sciatica, right side: Secondary | ICD-10-CM | POA: Diagnosis not present

## 2014-12-10 DIAGNOSIS — M1611 Unilateral primary osteoarthritis, right hip: Secondary | ICD-10-CM | POA: Diagnosis not present

## 2014-12-11 DIAGNOSIS — M1611 Unilateral primary osteoarthritis, right hip: Secondary | ICD-10-CM | POA: Diagnosis not present

## 2014-12-17 DIAGNOSIS — M79671 Pain in right foot: Secondary | ICD-10-CM | POA: Diagnosis not present

## 2014-12-19 DIAGNOSIS — G629 Polyneuropathy, unspecified: Secondary | ICD-10-CM | POA: Diagnosis not present

## 2015-01-21 ENCOUNTER — Encounter: Payer: Self-pay | Admitting: Podiatry

## 2015-01-21 ENCOUNTER — Ambulatory Visit (INDEPENDENT_AMBULATORY_CARE_PROVIDER_SITE_OTHER): Payer: Medicare Other | Admitting: Podiatry

## 2015-01-21 ENCOUNTER — Ambulatory Visit (INDEPENDENT_AMBULATORY_CARE_PROVIDER_SITE_OTHER): Payer: Medicare Other

## 2015-01-21 VITALS — BP 123/86 | HR 69 | Resp 12

## 2015-01-21 DIAGNOSIS — I739 Peripheral vascular disease, unspecified: Secondary | ICD-10-CM

## 2015-01-21 DIAGNOSIS — M7751 Other enthesopathy of right foot: Secondary | ICD-10-CM

## 2015-01-21 DIAGNOSIS — M79671 Pain in right foot: Secondary | ICD-10-CM

## 2015-01-21 DIAGNOSIS — M779 Enthesopathy, unspecified: Secondary | ICD-10-CM

## 2015-01-21 DIAGNOSIS — M778 Other enthesopathies, not elsewhere classified: Secondary | ICD-10-CM

## 2015-01-21 NOTE — Progress Notes (Signed)
   Subjective:    Patient ID: Janet Giles, female    DOB: 28-Oct-1940, 74 y.o.   MRN: [id number]  HPI she presents today with chief complaint of numbness and tingling to the second digit of the right foot. She states this been present for several months and has done nothing to treat it. She relates some calf pain on ambulation.    Review of Systems  Neurological: Positive for numbness.       Objective:   Physical Exam: I have reviewed her past medical history medications allergies surgeon social history. 74 year old female no acute distress pulses are strongly palpable left foot but nonpalpable right foot. Neurologic sensorium is intact per Semmes-Weinstein monofilament. Deep tendon reflexes are intact. Muscle strength +5 over 5 dorsiflexion plantar flexors and inverters everters all intrinsic musculature is intact. Orthopedic evaluation demonstrates all joints distal to the ankle L4 range of motion without crepitation. Cutaneous evaluation demonstrates supple well-hydrated cutis no erythema edema cellulitis drainage or odor. She has pain on end range of motion of the second metatarsophalangeal joint right foot radiographs 3 views taken of the right foot today demonstrate an osseously normal foot.        Assessment & Plan:  Assessment: Peripheral vascular disease right foot. Capsulitis second metatarsophalangeal joint right foot. Idiopathic neuropathy right foot.  Plan: We're requesting arterial studies bilateral lower extremity. I injected the second metatarsophalangeal joint today. We'll follow-up with her in 1 month.

## 2015-01-22 ENCOUNTER — Telehealth: Payer: Self-pay | Admitting: *Deleted

## 2015-01-22 NOTE — Telephone Encounter (Signed)
Faxed Dr. Stephenie Acres orders of 01/21/2015.

## 2015-01-24 ENCOUNTER — Other Ambulatory Visit: Payer: Self-pay | Admitting: Podiatry

## 2015-01-24 DIAGNOSIS — R0989 Other specified symptoms and signs involving the circulatory and respiratory systems: Secondary | ICD-10-CM

## 2015-01-29 ENCOUNTER — Ambulatory Visit (HOSPITAL_COMMUNITY)
Admission: RE | Admit: 2015-01-29 | Discharge: 2015-01-29 | Disposition: A | Discharge status: Home / Self Care | Payer: Medicare Other | Source: Ambulatory Visit | Attending: Cardiovascular Disease | Admitting: Cardiovascular Disease

## 2015-01-29 DIAGNOSIS — R202 Paresthesia of skin: Secondary | ICD-10-CM | POA: Diagnosis not present

## 2015-01-29 DIAGNOSIS — R0989 Other specified symptoms and signs involving the circulatory and respiratory systems: Secondary | ICD-10-CM

## 2015-01-29 DIAGNOSIS — R200 Anesthesia of skin: Secondary | ICD-10-CM | POA: Diagnosis not present

## 2015-01-31 ENCOUNTER — Telehealth: Payer: Self-pay | Admitting: *Deleted

## 2015-01-31 NOTE — Telephone Encounter (Addendum)
-----   Message from Garrel Ridgel, [place] sent at 01/30/2015  5:05 PM EDT ----- Vascular eval  Looks normal and I will see her at her next scheduled appointment.  Dr. Stephenie Acres report called to pt.  Pt states she has an appt 02/18/2015 and is still having the pain, what else can be done until her appt?  I informed pt of Dr. Stephenie Acres recommendation and she states she'll come to the [place] office to pick one up on Friday.

## 2015-01-31 NOTE — Telephone Encounter (Signed)
She could by and pick up a surgical darco shoe.

## 2015-02-12 DIAGNOSIS — M47816 Spondylosis without myelopathy or radiculopathy, lumbar region: Secondary | ICD-10-CM | POA: Diagnosis not present

## 2015-02-14 DIAGNOSIS — M1611 Unilateral primary osteoarthritis, right hip: Secondary | ICD-10-CM | POA: Diagnosis not present

## 2015-02-14 DIAGNOSIS — M7061 Trochanteric bursitis, right hip: Secondary | ICD-10-CM | POA: Diagnosis not present

## 2015-02-18 ENCOUNTER — Ambulatory Visit (INDEPENDENT_AMBULATORY_CARE_PROVIDER_SITE_OTHER): Payer: Medicare Other | Admitting: Podiatry

## 2015-02-18 ENCOUNTER — Encounter: Payer: Self-pay | Admitting: Podiatry

## 2015-02-18 VITALS — BP 128/68 | HR 75 | Resp 16

## 2015-02-18 DIAGNOSIS — M779 Enthesopathy, unspecified: Principal | ICD-10-CM

## 2015-02-18 DIAGNOSIS — I739 Peripheral vascular disease, unspecified: Secondary | ICD-10-CM

## 2015-02-18 DIAGNOSIS — M7751 Other enthesopathy of right foot: Secondary | ICD-10-CM | POA: Diagnosis not present

## 2015-02-18 DIAGNOSIS — M778 Other enthesopathies, not elsewhere classified: Secondary | ICD-10-CM

## 2015-02-18 NOTE — Progress Notes (Signed)
She presents complaining of pain to the second metatarsophalangeal joint of the right foot states it seems to be getting worse as does the numbness. States that seems to be more swollen.  Objective: Pulses are palpable right foot. She has a moderate swelling with fluid of the second metatarsophalangeal joint.  Assessment: Capsulitis second metatarsophalangeal joint right foot.  Plan: We tapped the joint to danger off 0.75 mL of bloody joint fluid. This does not appear to be infectious. I also injected Kenalog into the joint. I will follow up with her in 1 month at which time we will consider surgical intervention   [facility] DPM

## 2015-02-26 ENCOUNTER — Other Ambulatory Visit: Payer: Self-pay | Admitting: Orthopedic Surgery

## 2015-03-01 ENCOUNTER — Encounter: Payer: Self-pay | Admitting: Family

## 2015-03-06 ENCOUNTER — Ambulatory Visit (HOSPITAL_COMMUNITY)
Admission: RE | Admit: 2015-03-06 | Discharge: 2015-03-06 | Disposition: A | Discharge status: Home / Self Care | Payer: Medicare Other | Source: Ambulatory Visit | Attending: Vascular Surgery | Admitting: Vascular Surgery

## 2015-03-06 ENCOUNTER — Ambulatory Visit (INDEPENDENT_AMBULATORY_CARE_PROVIDER_SITE_OTHER): Payer: Medicare Other | Admitting: Family

## 2015-03-06 ENCOUNTER — Encounter: Payer: Self-pay | Admitting: Family

## 2015-03-06 VITALS — BP 120/68 | HR 59 | Temp 98.0°F | Resp 16 | Ht 64.5 in | Wt 145.0 lb

## 2015-03-06 DIAGNOSIS — I77811 Abdominal aortic ectasia: Secondary | ICD-10-CM | POA: Diagnosis not present

## 2015-03-06 DIAGNOSIS — Z87891 Personal history of nicotine dependence: Secondary | ICD-10-CM | POA: Diagnosis not present

## 2015-03-06 NOTE — Addendum Note (Signed)
Addended by: Mena Goes on: 03/06/2015 02:38 PM   Modules accepted: Orders

## 2015-03-06 NOTE — Patient Instructions (Signed)
Abdominal Aortic Aneurysm An aneurysm is a weakened or damaged part of an artery wall that bulges from the normal force of blood pumping through the body. An abdominal aortic aneurysm is an aneurysm that occurs in the lower part of the aorta, the main artery of the body.  The major concern with an abdominal aortic aneurysm is that it can enlarge and burst (rupture) or blood can flow between the layers of the wall of the aorta through a tear (aorticdissection). Both of these conditions can cause bleeding inside the body and can be life threatening unless diagnosed and treated promptly. CAUSES  The exact cause of an abdominal aortic aneurysm is unknown. Some contributing factors are:   A hardening of the arteries caused by the buildup of fat and other substances in the lining of a blood vessel (arteriosclerosis).  Inflammation of the walls of an artery (arteritis).   Connective tissue diseases, such as Marfan syndrome.   Abdominal trauma.   An infection, such as syphilis or staphylococcus, in the wall of the aorta (infectious aortitis) caused by bacteria. RISK FACTORS  Risk factors that contribute to an abdominal aortic aneurysm may include:  Age older than 60 years.   High blood pressure (hypertension).  Female gender.  Ethnicity (white race).  Obesity.  Family history of aneurysm (first degree relatives only).  Tobacco use. PREVENTION  The following healthy lifestyle habits may help decrease your risk of abdominal aortic aneurysm:  Quitting smoking. Smoking can raise your blood pressure and cause arteriosclerosis.  Limiting or avoiding alcohol.  Keeping your blood pressure, blood sugar level, and cholesterol levels within normal limits.  Decreasing your salt intake. In somepeople, too much salt can raise blood pressure and increase your risk of abdominal aortic aneurysm.  Eating a diet low in saturated fats and cholesterol.  Increasing your fiber intake by including  whole grains, vegetables, and fruits in your diet. Eating these foods may help lower blood pressure.  Maintaining a healthy weight.  Staying physically active and exercising regularly. SYMPTOMS  The symptoms of abdominal aortic aneurysm may vary depending on the size and rate of growth of the aneurysm.Most grow slowly and do not have any symptoms. When symptoms do occur, they may include:  Pain (abdomen, side, lower back, or groin). The pain may vary in intensity. A sudden onset of severe pain may indicate that the aneurysm has ruptured.  Feeling full after eating only small amounts of food.  Nausea or vomiting or both.  Feeling a pulsating lump in the abdomen.  Feeling faint or passing out. DIAGNOSIS  Since most unruptured abdominal aortic aneurysms have no symptoms, they are often discovered during diagnostic exams for other conditions. An aneurysm may be found during the following procedures:  Ultrasonography (A one-time screening for abdominal aortic aneurysm by ultrasonography is also recommended for all men aged 65-75 years who have ever smoked).  X-ray exams.  A computed tomography (CT).  Magnetic resonance imaging (MRI).  Angiography or arteriography. TREATMENT  Treatment of an abdominal aortic aneurysm depends on the size of your aneurysm, your age, and risk factors for rupture. Medication to control blood pressure and pain may be used to manage aneurysms smaller than 6 cm. Regular monitoring for enlargement may be recommended by your caregiver if:  The aneurysm is 3-4 cm in size (an annual ultrasonography may be recommended).  The aneurysm is 4-4.5 cm in size (an ultrasonography every 6 months may be recommended).  The aneurysm is larger than 4.5 cm in   size (your caregiver may ask that you be examined by a vascular surgeon). If your aneurysm is larger than 6 cm, surgical repair may be recommended. There are two main methods for repair of an aneurysm:   Endovascular  repair (a minimally invasive surgery). This is done most often.  Open repair. This method is used if an endovascular repair is not possible.   This information is not intended to replace advice given to you by your health care provider. Make sure you discuss any questions you have with your health care provider.   Document Released: 02/04/2005 Document Revised: 08/22/2012 Document Reviewed: 05/27/2012 Elsevier Interactive Patient Education 2016 Elsevier Inc.  

## 2015-03-06 NOTE — Progress Notes (Signed)
[facility] [place]  Established Abdominal Aortic Aneurysm  History of Present Illness  Janet Giles is a 74 y.o. (Apr 24, 1941) female patient of Dr. Bridgett Larsson who presents with chief complaint: follow up for abdominal aortic ectasia. 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 The patient does have chronic back pain and has undergone back procedures in the past. She has some intermittent sharp umbilical area pain, pt states her PCP told her this may be due to an umbilical hernia. The patient is not a smoker. She has never had any embolic symptoms. Pt is received sclerotherapy with [facility].  Dr. Bridgett Larsson last saw pt on 05/26/13. At that time, based on the patient's exam and diagnostic studies, Dr. Bridgett Larsson recommended every 2 year aortic duplex. If no further growth, can consider discontinuing AAA duplex.  She is having right hip replacement surgery on 03/25/15, Dr. Frederik Pear with [facility]. She saw Dr. Glenna Fellows, her spine specialist recently.  Her right foot symptoms are bothersome to her since June 2016: numbness in 2nd and great toes, feet and hands stay cold, pain in right lateral calf. These symptoms are the same whether she is supine, sitting, or walking. She has a lumpy sensation on the sole of her right foot when walking.  She has right hip pain constantly, worse with walking. She denies non healing wounds.  The patient denies history of stroke or TIA symptoms.  Pt Diabetic: No Pt smoker: former smoker, quit in 2001  Past Medical History  Diagnosis Date  . AAA (abdominal aortic aneurysm) ([place])   . Hyperlipidemia   . Degenerative joint disease of spine    Past Surgical History  Procedure Laterality Date  . Back surgery      L4-L5  . Tonsillectomy and adenoidectomy    . Abdominal hysterectomy     Social History Social History   Social History  . Marital Status: Married    Spouse Name: N/A  . Number of  Children: N/A  . Years of Education: N/A   Occupational History  . Not on file.   Social History Main Topics  . Smoking status: Former Smoker    Types: Cigarettes    Quit date: 05/12/1999  . Smokeless tobacco: Never Used  . Alcohol Use: No  . Drug Use: No  . Sexual Activity: Not on file   Other Topics Concern  . Not on file   Social History Narrative   Family History Family History  Problem Relation Age of Onset  . Diabetes Mother   . Heart disease Mother   . Heart attack Mother   . Bleeding Disorder Father   . Heart disease Sister   . Heart attack Sister   . Cancer Sister   . Deep vein thrombosis Sister   . Diabetes Sister   . Hyperlipidemia Sister   . Hypertension Sister   . Varicose Veins Sister   . AAA (abdominal aortic aneurysm) Sister   . Bleeding Disorder Sister   . Heart disease Brother   . Heart attack Brother   . Hypertension Brother     Current Outpatient Prescriptions on File Prior to Visit  Medication Sig Dispense Refill  . Calcium Carbonate-Vitamin D (CALCIUM + D PO) Take 1 tablet by mouth 2 (two) times daily.    Marland Kitchen estradiol (ESTRACE) 0.5 MG tablet Take 1 tablet by mouth daily.    Marland Kitchen gabapentin (NEURONTIN) 300 MG capsule Take 1 capsule by mouth 2 (two) times daily.    Marland Kitchen  HYDROcodone-acetaminophen (NORCO/VICODIN) 5-325 MG tablet     . levothyroxine (SYNTHROID, LEVOTHROID) 75 MCG tablet Take 1 tablet by mouth daily.    Marland Kitchen lovastatin (MEVACOR) 20 MG tablet Take 1 tablet by mouth daily.    . meloxicam (MOBIC) 15 MG tablet Take 1 tablet by mouth daily.     No current facility-administered medications on file prior to visit.   Allergies  Allergen Reactions  . Wasp Venom     ROS: See HPI for pertinent positives and negatives.  Physical Examination  Filed Vitals:   03/06/15 1035  BP: 120/68  Pulse: 59  Temp: 98 F (36.7 C)  Resp: 16  Height: 5' 4.5" (1.638 m)  Weight: 145 lb (65.772 kg)  SpO2: 100%   Body mass index is 24.51  kg/(m^2).  General: A&O x 3, WD, female  Pulmonary: Sym exp, good air movt, CTAB, no rales, rhonchi, or wheezing  Cardiac: RRR, Nl S1, S2, no detected murmur  Vascular: Vessel Right Left  Radial Palpable Palpable  Carotid Palpable, without bruit Palpable, without bruit  Aorta Not palpable N/A  Femoral 1+Palpable 2+Palpable  Popliteal Not palpable Not palpable  PT 1+ Palpable 2+ Palpable  DP 1+ Palpable 2+ Palpable   Gastrointestinal: soft, NTND, -G/R, - HSM, - masses, - CVAT B, no palpable AAA  Musculoskeletal: M/S 5/5 throughout, extremities without ischemic changes , no edema in bilateral feet, no LDS , few spider veins  Neurologic: Pain and light touch intact in extremities , Motor exam as listed above         Non-Invasive Vascular Imaging  AAA Duplex (03/06/2015) ABDOMINAL AORTA DUPLEX EVALUATION    INDICATION: Evaluation of abdominal aorta    PREVIOUS INTERVENTION(S): None    DUPLEX EXAM:     LOCATION DIAMETER AP (cm) DIAMETER TRANSVERSE (cm) VELOCITIES (cm/sec)  Aorta Proximal 2.0 2.0 63  Aorta Mid 1.8 1.9 54  Aorta Distal 2.7 2.6 72  Right Common Iliac Artery .93 .85 124  Left Common Iliac Artery .81 .63 113    Previous max aortic diameter:  2.56 x 2.54 Date: 05/26/2013     ADDITIONAL FINDINGS:     IMPRESSION: 2.7 x 2.6 cm distal abdominal aorta    Compared to the previous exam:  Minimal increase in size    Medical Decision Making  The patient is a 74 y.o. female who presents with no symptoms referable to AAA with minimal increase in size, from 2.56 cm to 2.7 cm in 22 months.   Based on this patient's exam and diagnostic studies, the patient will follow up in 2 years  with the following studies: AAA duplex.  Consideration for repair of AAA would be made when the size is 5.5 cm, growth > 1 cm/yr, and symptomatic status.  I emphasized the importance of maximal medical management including strict control of blood pressure,  blood glucose, and lipid levels, antiplatelet agents, obtaining regular exercise, and continued cessation of smoking.   The patient was given information about AAA including signs, symptoms, treatment, and how to minimize the risk of enlargement and rupture of aneurysms.    The patient was advised to call 911 should the patient experience sudden onset abdominal or back pain.   Thank you for allowing [place] to participate in this patient's care.  Clemon Chambers, RN, MSN, FNP-C [facility] [place] Office: [phone]  Clinic Physician: Bridgett Larsson  03/06/2015, 10:45 AM

## 2015-03-12 NOTE — Pre-Procedure Instructions (Addendum)
Janet Giles  03/12/2015      [facility] - [place] ([place]), [place] [address] [place] [address] Phone: [phone] Fax: [phone]    Your procedure is scheduled on Mon, Nov 14  Report to [facility] Admitting at 11:00 AM  Call this number if you have problems the morning of surgery:  [phone]   Remember:  Do not eat food or drink liquids after midnight.  Take these medicines the morning of surgery with A SIP OF WATER Gabapentin(Neurontin),Pain Pill(if needed),and Synthroid(Levothyroxine) inhaler if needed bring with you             Stop taking your Meloxicam along with any Vitamins(vit D,biotin) a week prior to surgery. No Goody's,BC's,Aleve,Aspirin,Ibuprofen,Motrin,Advil,Fish Oil,or any Herbal Medications    Do not wear jewelry, make-up or nail polish.  Do not wear lotions, powders, or perfumes.  You may wear deodorant.  Do not shave 48 hours prior to surgery.    Do not bring valuables to the hospital.  [facility] is not responsible for any belongings or valuables.  Contacts, dentures or bridgework may not be worn into surgery.  Leave your suitcase in the car.  After surgery it may be brought to your room.  For patients admitted to the hospital, discharge time will be determined by your treatment team.  Patients discharged the day of surgery will not be allowed to drive home.    Special instructions:  Janet Giles - Preparing for Surgery  Before surgery, you can play an important role.  Because skin is not sterile, your skin needs to be as free of germs as possible.  You can reduce the number of germs on you skin by washing with CHG (chlorahexidine gluconate) soap before surgery.  CHG is an antiseptic cleaner which kills germs and bonds with the skin to continue killing germs even after washing.  Please DO NOT use if you have an allergy to CHG or antibacterial soaps.  If your skin becomes  reddened/irritated stop using the CHG and inform your nurse when you arrive at Short Stay.  Do not shave (including legs and underarms) for at least 48 hours prior to the first CHG shower.  You may shave your face.  Please follow these instructions carefully:   1.  Shower with CHG Soap the night before surgery and the                                morning of Surgery.  2.  If you choose to wash your hair, wash your hair first as usual with your       normal shampoo.  3.  After you shampoo, rinse your hair and body thoroughly to remove the                      Shampoo.  4.  Use CHG as you would any other liquid soap.  You can apply chg directly       to the skin and wash gently with scrungie or a clean washcloth.  5.  Apply the CHG Soap to your body ONLY FROM THE NECK DOWN.        Do not use on open wounds or open sores.  Avoid contact with your eyes,       ears, mouth and genitals (private parts).  Wash genitals (private parts)  with your normal soap.  6.  Wash thoroughly, paying special attention to the area where your surgery        will be performed.  7.  Thoroughly rinse your body with warm water from the neck down.  8.  DO NOT shower/wash with your normal soap after using and rinsing off       the CHG Soap.  9.  Pat yourself dry with a clean towel.            10.  Wear clean pajamas.            11.  Place clean sheets on your bed the night of your first shower and do not        sleep with pets.  Day of Surgery  Do not apply any lotions/deoderants the morning of surgery.  Please wear clean clothes to the hospital/surgery center.    Please read over the following fact sheets that you were given. Pain Booklet, Coughing and Deep Breathing, Blood Transfusion Information, MRSA Information and Surgical Site Infection Prevention

## 2015-03-13 ENCOUNTER — Ambulatory Visit (HOSPITAL_COMMUNITY)
Admission: RE | Admit: 2015-03-13 | Discharge: 2015-03-13 | Disposition: A | Discharge status: Home / Self Care | Payer: Medicare Other | Source: Ambulatory Visit | Attending: Orthopedic Surgery | Admitting: Orthopedic Surgery

## 2015-03-13 ENCOUNTER — Encounter (HOSPITAL_COMMUNITY)
Admission: RE | Admit: 2015-03-13 | Discharge: 2015-03-13 | Disposition: A | Discharge status: Home / Self Care | Payer: Medicare Other | Source: Ambulatory Visit | Attending: Orthopedic Surgery | Admitting: Orthopedic Surgery

## 2015-03-13 ENCOUNTER — Encounter (HOSPITAL_COMMUNITY): Payer: Self-pay

## 2015-03-13 DIAGNOSIS — M1611 Unilateral primary osteoarthritis, right hip: Secondary | ICD-10-CM | POA: Diagnosis not present

## 2015-03-13 DIAGNOSIS — E785 Hyperlipidemia, unspecified: Secondary | ICD-10-CM | POA: Insufficient documentation

## 2015-03-13 DIAGNOSIS — Z01812 Encounter for preprocedural laboratory examination: Secondary | ICD-10-CM | POA: Diagnosis not present

## 2015-03-13 DIAGNOSIS — I714 Abdominal aortic aneurysm, without rupture: Secondary | ICD-10-CM | POA: Diagnosis not present

## 2015-03-13 DIAGNOSIS — Z01810 Encounter for preprocedural cardiovascular examination: Secondary | ICD-10-CM | POA: Insufficient documentation

## 2015-03-13 DIAGNOSIS — K219 Gastro-esophageal reflux disease without esophagitis: Secondary | ICD-10-CM | POA: Insufficient documentation

## 2015-03-13 DIAGNOSIS — Z01818 Encounter for other preprocedural examination: Secondary | ICD-10-CM | POA: Diagnosis not present

## 2015-03-13 DIAGNOSIS — Z87891 Personal history of nicotine dependence: Secondary | ICD-10-CM | POA: Insufficient documentation

## 2015-03-13 DIAGNOSIS — E039 Hypothyroidism, unspecified: Secondary | ICD-10-CM | POA: Insufficient documentation

## 2015-03-13 HISTORY — DX: Adverse effect of unspecified anesthetic, initial encounter: T41.45XA

## 2015-03-13 HISTORY — DX: Other complications of anesthesia, initial encounter: T88.59XA

## 2015-03-13 HISTORY — DX: Gastro-esophageal reflux disease without esophagitis: K21.9

## 2015-03-13 HISTORY — DX: Hypothyroidism, unspecified: E03.9

## 2015-03-13 HISTORY — DX: Family history of other specified conditions: Z84.89

## 2015-03-13 HISTORY — DX: Other specified postprocedural states: Z98.890

## 2015-03-13 HISTORY — DX: Reserved for inherently not codable concepts without codable children: IMO0001

## 2015-03-13 HISTORY — DX: Nausea with vomiting, unspecified: R11.2

## 2015-03-13 LAB — TYPE AND SCREEN
ABO/RH(D): A POS
Antibody Screen: NEGATIVE

## 2015-03-13 LAB — BASIC METABOLIC PANEL
Anion gap: 9 (ref 5–15)
BUN: 20 mg/dL (ref 6–20)
CO2: 29 mmol/L (ref 22–32)
Calcium: 10.2 mg/dL (ref 8.9–10.3)
Chloride: 102 mmol/L (ref 101–111)
Creatinine, Ser: 0.78 mg/dL (ref 0.44–1.00)
GFR calc Af Amer: >60 mL/min (ref >60)
GFR calc non Af Amer: >60 mL/min (ref >60)
Glucose, Bld: 87 mg/dL (ref 65–99)
Potassium: 4.2 mmol/L (ref 3.5–5.1)
Sodium: 140 mmol/L (ref 135–145)

## 2015-03-13 LAB — CBC WITH DIFFERENTIAL/PLATELET
Basophils Absolute: 0 10*3/uL (ref 0.0–0.1)
Basophils Relative: 0 %
Eosinophils Absolute: 0.2 10*3/uL (ref 0.0–0.7)
Eosinophils Relative: 4 %
HCT: 33.8 % — ABNORMAL LOW (ref 36.0–46.0)
Hemoglobin: 11.1 g/dL — ABNORMAL LOW (ref 12.0–15.0)
Lymphocytes Relative: 39 %
Lymphs Abs: 1.9 10*3/uL (ref 0.7–4.0)
MCH: 30.4 pg (ref 26.0–34.0)
MCHC: 32.8 g/dL (ref 30.0–36.0)
MCV: 92.6 fL (ref 78.0–100.0)
Monocytes Absolute: 0.5 10*3/uL (ref 0.1–1.0)
Monocytes Relative: 10 %
Neutro Abs: 2.3 10*3/uL (ref 1.7–7.7)
Neutrophils Relative %: 47 %
Platelets: 312 10*3/uL (ref 150–400)
RBC: 3.65 MIL/uL — ABNORMAL LOW (ref 3.87–5.11)
RDW: 12.9 % (ref 11.5–15.5)
WBC: 5 10*3/uL (ref 4.0–10.5)

## 2015-03-13 LAB — PROTIME-INR
INR: 0.94 (ref 0.00–1.49)
Prothrombin Time: 12.8 seconds (ref 11.6–15.2)

## 2015-03-13 LAB — URINALYSIS, ROUTINE W REFLEX MICROSCOPIC
Bilirubin Urine: NEGATIVE
Glucose, UA: NEGATIVE mg/dL
Hgb urine dipstick: NEGATIVE
Ketones, ur: NEGATIVE mg/dL
Leukocytes, UA: NEGATIVE
Nitrite: NEGATIVE
Protein, ur: NEGATIVE mg/dL
Specific Gravity, Urine: 1.02 (ref 1.005–1.030)
Urobilinogen, UA: 0.2 mg/dL (ref 0.0–1.0)
pH: 6 (ref 5.0–8.0)

## 2015-03-13 LAB — APTT: aPTT: 29 seconds (ref 24–37)

## 2015-03-13 LAB — SURGICAL PCR SCREEN
MRSA, PCR: NEGATIVE
Staphylococcus aureus: NEGATIVE

## 2015-03-13 LAB — ABO/RH: ABO/RH(D): A POS

## 2015-03-14 NOTE — Progress Notes (Signed)
Anesthesia Chart Review:  Pt is 74 year old female scheduled for R total hip arthroplasty anterior approach on 03/25/2015 with Dr. Mayer Camel.   PMH includes: AAA, hypothyroidism, hyperlipidemia, GERD. Former smoker. BMI 25.   Anesthesia history: post-op N/V. "Trouble breathing" after back surgery.   Medications include: albuterol, levothyroxine.   Preoperative labs reviewed.    Chest x-ray 03/13/15 reviewed. Mild hyperinflation may reflect reactive airway disease or COPD. There is no acute cardiopulmonary abnormality.  EKG 03/13/15: NSR. Possible Left atrial enlargement.   AAA duplex US 03/06/15: 2.7 x 2.6 distal abdominal aorta. Minimal increase in size compared to previous exam.   If no changes, I anticipate pt can proceed with surgery as scheduled.   Willeen Cass, FNP-BC [facility] Short Stay Surgical Center/Anesthesiology Phone: [phone] 03/14/2015 1:11 PM

## 2015-03-18 ENCOUNTER — Encounter: Payer: Self-pay | Admitting: Podiatry

## 2015-03-18 ENCOUNTER — Ambulatory Visit (INDEPENDENT_AMBULATORY_CARE_PROVIDER_SITE_OTHER): Payer: Medicare Other | Admitting: Podiatry

## 2015-03-18 VITALS — BP 125/64 | HR 86 | Resp 16

## 2015-03-18 DIAGNOSIS — M7751 Other enthesopathy of right foot: Secondary | ICD-10-CM

## 2015-03-18 DIAGNOSIS — M779 Enthesopathy, unspecified: Principal | ICD-10-CM

## 2015-03-18 DIAGNOSIS — M778 Other enthesopathies, not elsewhere classified: Secondary | ICD-10-CM

## 2015-03-18 NOTE — Progress Notes (Signed)
She presents for follow-up of her capsulitis second metatarsophalangeal joint of the right foot. She states this seems to be doing no better than it was previously. She states that her right hip is getting much worse and she has surgery scheduled for total hip replacement with Dr. Mayer Camel next Monday.  Objective: Pulses are nonpalpable but capillary fill time is immediate vascular studies indicate normal ABIs. No complications. Much decreased pain on range of motion of the second metatarsophalangeal joint and previously noted. It seems to be no more numb possibly now than it was previously. This. We'll could be associated with her hip issues.  Assessment: Capsulitis second metatarsophalangeal joint right foot.  Plan: I would like to follow up with her once her right hip surgery has been performed. We can consider orthotics and/or surgical correction.  Roselind Messier DPM

## 2015-03-19 DIAGNOSIS — M25551 Pain in right hip: Secondary | ICD-10-CM | POA: Diagnosis not present

## 2015-03-21 NOTE — H&P (Signed)
TOTAL HIP ADMISSION H&P  Patient is admitted for right total hip arthroplasty.  Subjective:  Chief Complaint: right hip pain  HPI: Janet Giles, 74 y.o. female, has a history of pain and functional disability in the right hip(s) due to arthritis and patient has failed non-surgical conservative treatments for greater than 12 weeks to include NSAID's and/or analgesics, corticosteriod injections, flexibility and strengthening excercises, use of assistive devices, weight reduction as appropriate and activity modification.  Onset of symptoms was gradual starting 2 years ago with gradually worsening course since that time.The patient noted no past surgery on the right hip(s).  Patient currently rates pain in the right hip at 10 out of 10 with activity. Patient has night pain, worsening of pain with activity and weight bearing, pain that interfers with activities of daily living and pain with passive range of motion. Patient has evidence of joint space narrowing by imaging studies. This condition presents safety issues increasing the risk of falls.   There is no current active infection.  Patient Active Problem List   Diagnosis Date Noted  . Aortic ectasia, abdominal ([place]) 05/26/2013   Past Medical History  Diagnosis Date  . AAA (abdominal aortic aneurysm) ([place])   . Hyperlipidemia   . Degenerative joint disease of spine   . Complication of anesthesia     trouble breathing after back surgery  . PONV (postoperative nausea and vomiting)   . Family history of adverse reaction to anesthesia     one sister nasea and vomiting  . Shortness of breath dyspnea     uses inhaler occ  . Hypothyroidism   . GERD (gastroesophageal reflux disease)     occ    Past Surgical History  Procedure Laterality Date  . Tonsillectomy and adenoidectomy    . Abdominal hysterectomy    . Back surgery  02    L4-L5    No prescriptions prior to admission   Allergies  Allergen Reactions  . Wasp Venom     Social  History  Substance Use Topics  . Smoking status: Former Smoker -- 1.50 packs/day for 35 years    Types: Cigarettes    Quit date: 05/11/2000  . Smokeless tobacco: Never Used  . Alcohol Use: No    Family History  Problem Relation Age of Onset  . Diabetes Mother   . Heart disease Mother   . Heart attack Mother   . Bleeding Disorder Father   . Heart disease Sister   . Heart attack Sister   . Cancer Sister   . Deep vein thrombosis Sister   . Diabetes Sister   . Hyperlipidemia Sister   . Hypertension Sister   . Varicose Veins Sister   . AAA (abdominal aortic aneurysm) Sister   . Bleeding Disorder Sister   . Heart disease Brother   . Heart attack Brother   . Hypertension Brother       Review of Systems  Constitutional: Negative.   HENT: Negative.   Eyes: Negative.   Respiratory: Negative.   Cardiovascular: Negative.   Gastrointestinal: Negative.   Genitourinary: Negative.   Musculoskeletal: Positive for myalgias and joint pain.  Skin: Negative.   Neurological: Negative.   Endo/Heme/Allergies:       Over active thyroid  Psychiatric/Behavioral: Negative.     Objective:  Physical Exam  Constitutional: She is oriented to person, place, and time. She appears well-developed and well-nourished.  HENT:  Head: Normocephalic and atraumatic.  Eyes: Pupils are equal, round, and reactive to light.  Neck: Normal range of motion. Neck supple.  Cardiovascular: Intact distal pulses.   Respiratory: Effort normal.  GI: Soft. Bowel sounds are normal.  Musculoskeletal: She exhibits tenderness.  the patient's left hip has good strength good range of motion and no pain.  Patient's right hip does have irritability with internal rotation.  She also has mild groin pain.  Moderate pain with palpation over the greater trochanteric bursal region.  No instability.  Her calves are soft and nontender.  Neurological: She is alert and oriented to person, place, and time.  Skin: Skin is warm and  dry.  Psychiatric: She has a normal mood and affect. Her behavior is normal. Judgment and thought content normal.    Vital signs in last 24 hours:    Labs:   Estimated body mass index is 25.98 kg/(m^2) as calculated from the following:   Height as of 05/26/13: 5\' 4"  (1.626 m).   Weight as of 05/26/13: 68.675 kg (151 lb 6.4 oz).   Imaging Review Plain radiographs demonstrate  moderate arthritis of the right hip  Assessment/Plan:  End stage arthritis, right hip(s)  The patient history, physical examination, clinical judgement of the provider and imaging studies are consistent with end stage degenerative joint disease of the right hip(s) and total hip arthroplasty is deemed medically necessary. The treatment options including medical management, injection therapy, arthroscopy and arthroplasty were discussed at length. The risks and benefits of total hip arthroplasty were presented and reviewed. The risks due to aseptic loosening, infection, stiffness, dislocation/subluxation,  thromboembolic complications and other imponderables were discussed.  The patient acknowledged the explanation, agreed to proceed with the plan and consent was signed. Patient is being admitted for inpatient treatment for surgery, pain control, PT, OT, prophylactic antibiotics, VTE prophylaxis, progressive ambulation and ADL's and discharge planning.The patient is planning to be discharged home with home health services

## 2015-03-24 DIAGNOSIS — M1611 Unilateral primary osteoarthritis, right hip: Secondary | ICD-10-CM | POA: Diagnosis present

## 2015-03-24 HISTORY — DX: Unilateral primary osteoarthritis, right hip: M16.11

## 2015-03-25 ENCOUNTER — Encounter (HOSPITAL_COMMUNITY)
Admission: RE | Disposition: A | Discharge status: Home Health | Payer: Self-pay | Source: Ambulatory Visit | Attending: Orthopedic Surgery

## 2015-03-25 ENCOUNTER — Inpatient Hospital Stay (HOSPITAL_COMMUNITY): Payer: Medicare Other | Admitting: Certified Registered Nurse Anesthetist

## 2015-03-25 ENCOUNTER — Inpatient Hospital Stay (HOSPITAL_COMMUNITY)
Admission: RE | Admit: 2015-03-25 | Discharge: 2015-03-27 | DRG: 470 | Disposition: A | Discharge status: Home Health | Payer: Medicare Other | Source: Ambulatory Visit | Attending: Orthopedic Surgery | Admitting: Orthopedic Surgery

## 2015-03-25 ENCOUNTER — Inpatient Hospital Stay (HOSPITAL_COMMUNITY): Payer: Medicare Other | Admitting: Emergency Medicine

## 2015-03-25 ENCOUNTER — Encounter (HOSPITAL_COMMUNITY): Payer: Self-pay | Admitting: Certified Registered Nurse Anesthetist

## 2015-03-25 ENCOUNTER — Inpatient Hospital Stay (HOSPITAL_COMMUNITY): Payer: Medicare Other

## 2015-03-25 DIAGNOSIS — M169 Osteoarthritis of hip, unspecified: Secondary | ICD-10-CM

## 2015-03-25 DIAGNOSIS — M25551 Pain in right hip: Secondary | ICD-10-CM | POA: Diagnosis not present

## 2015-03-25 DIAGNOSIS — Z87891 Personal history of nicotine dependence: Secondary | ICD-10-CM | POA: Diagnosis not present

## 2015-03-25 DIAGNOSIS — I714 Abdominal aortic aneurysm, without rupture: Secondary | ICD-10-CM | POA: Diagnosis present

## 2015-03-25 DIAGNOSIS — E039 Hypothyroidism, unspecified: Secondary | ICD-10-CM | POA: Diagnosis present

## 2015-03-25 DIAGNOSIS — E785 Hyperlipidemia, unspecified: Secondary | ICD-10-CM | POA: Diagnosis present

## 2015-03-25 DIAGNOSIS — Z96641 Presence of right artificial hip joint: Secondary | ICD-10-CM | POA: Diagnosis not present

## 2015-03-25 DIAGNOSIS — Z471 Aftercare following joint replacement surgery: Secondary | ICD-10-CM | POA: Diagnosis not present

## 2015-03-25 DIAGNOSIS — M1611 Unilateral primary osteoarthritis, right hip: Principal | ICD-10-CM | POA: Diagnosis present

## 2015-03-25 DIAGNOSIS — R0602 Shortness of breath: Secondary | ICD-10-CM | POA: Diagnosis not present

## 2015-03-25 DIAGNOSIS — K219 Gastro-esophageal reflux disease without esophagitis: Secondary | ICD-10-CM | POA: Diagnosis not present

## 2015-03-25 HISTORY — PX: TOTAL HIP ARTHROPLASTY: SHX124

## 2015-03-25 SURGERY — ARTHROPLASTY, HIP, TOTAL, ANTERIOR APPROACH
Anesthesia: General | Site: Hip | Laterality: Right

## 2015-03-25 MED ORDER — KCL IN DEXTROSE-NACL 20-5-0.45 MEQ/L-%-% IV SOLN
INTRAVENOUS | Status: AC
  Filled 2015-03-25: qty 1000

## 2015-03-25 MED ORDER — MEPERIDINE HCL 25 MG/ML IJ SOLN: 6.2500 mg | INTRAMUSCULAR | Status: DC | PRN

## 2015-03-25 MED ORDER — OXYCODONE HCL 5 MG PO TABS
ORAL_TABLET | ORAL | Status: AC
  Filled 2015-03-25: qty 1

## 2015-03-25 MED ORDER — CHLORHEXIDINE GLUCONATE 4 % EX LIQD: 60.0000 mL | Freq: Once | CUTANEOUS | Status: DC

## 2015-03-25 MED ORDER — ASPIRIN EC 325 MG PO TBEC: 325.0000 mg | DELAYED_RELEASE_TABLET | Freq: Two times a day (BID) | ORAL | Status: DC

## 2015-03-25 MED ORDER — DEXAMETHASONE SODIUM PHOSPHATE 10 MG/ML IJ SOLN
10.0000 mg | Freq: Once | INTRAMUSCULAR | Status: AC
  Administered 2015-03-26: 10 mg via INTRAVENOUS
  Filled 2015-03-25: qty 1

## 2015-03-25 MED ORDER — CEFAZOLIN SODIUM-DEXTROSE 2-3 GM-% IV SOLR
INTRAVENOUS | Status: AC
  Filled 2015-03-25: qty 50

## 2015-03-25 MED ORDER — GABAPENTIN 300 MG PO CAPS
300.0000 mg | ORAL_CAPSULE | Freq: Two times a day (BID) | ORAL | Status: DC
  Administered 2015-03-25 – 2015-03-27 (×4): 300 mg via ORAL
  Filled 2015-03-25 (×4): qty 1

## 2015-03-25 MED ORDER — LACTATED RINGERS IV SOLN: INTRAVENOUS | Status: DC

## 2015-03-25 MED ORDER — ONDANSETRON HCL 4 MG/2ML IJ SOLN
INTRAMUSCULAR | Status: AC
  Filled 2015-03-25: qty 2

## 2015-03-25 MED ORDER — ESTRADIOL 1 MG PO TABS
0.5000 mg | ORAL_TABLET | Freq: Every day | ORAL | Status: DC
  Administered 2015-03-26 – 2015-03-27 (×2): 0.5 mg via ORAL
  Filled 2015-03-25 (×2): qty 0.5

## 2015-03-25 MED ORDER — ONDANSETRON HCL 4 MG/2ML IJ SOLN
INTRAMUSCULAR | Status: DC | PRN
  Administered 2015-03-25: 4 mg via INTRAVENOUS

## 2015-03-25 MED ORDER — FENTANYL CITRATE (PF) 100 MCG/2ML IJ SOLN
25.0000 ug | INTRAMUSCULAR | Status: DC | PRN
  Administered 2015-03-25 (×4): 25 ug via INTRAVENOUS

## 2015-03-25 MED ORDER — MIDAZOLAM HCL 2 MG/2ML IJ SOLN
INTRAMUSCULAR | Status: AC
  Administered 2015-03-25: 2 mg via INTRAVENOUS
  Filled 2015-03-25: qty 2

## 2015-03-25 MED ORDER — ASPIRIN EC 325 MG PO TBEC
325.0000 mg | DELAYED_RELEASE_TABLET | Freq: Every day | ORAL | Status: DC
  Administered 2015-03-26 – 2015-03-27 (×2): 325 mg via ORAL
  Filled 2015-03-25 (×2): qty 1

## 2015-03-25 MED ORDER — PHENYLEPHRINE HCL 10 MG/ML IJ SOLN
10.0000 mg | INTRAVENOUS | Status: DC | PRN
  Administered 2015-03-25: 25 ug/min via INTRAVENOUS

## 2015-03-25 MED ORDER — METHOCARBAMOL 1000 MG/10ML IJ SOLN
500.0000 mg | Freq: Four times a day (QID) | INTRAMUSCULAR | Status: DC | PRN
  Filled 2015-03-25: qty 5

## 2015-03-25 MED ORDER — DOCUSATE SODIUM 100 MG PO CAPS
100.0000 mg | ORAL_CAPSULE | Freq: Two times a day (BID) | ORAL | Status: DC
  Administered 2015-03-25 – 2015-03-27 (×4): 100 mg via ORAL
  Filled 2015-03-25 (×4): qty 1

## 2015-03-25 MED ORDER — DEXAMETHASONE SODIUM PHOSPHATE 4 MG/ML IJ SOLN
INTRAMUSCULAR | Status: DC | PRN
  Administered 2015-03-25: 4 mg via INTRAVENOUS

## 2015-03-25 MED ORDER — METHOCARBAMOL 500 MG PO TABS
500.0000 mg | ORAL_TABLET | Freq: Four times a day (QID) | ORAL | Status: DC | PRN
  Administered 2015-03-25 – 2015-03-27 (×4): 500 mg via ORAL
  Filled 2015-03-25 (×4): qty 1

## 2015-03-25 MED ORDER — HYDROMORPHONE HCL 1 MG/ML IJ SOLN
0.5000 mg | INTRAMUSCULAR | Status: DC | PRN
  Administered 2015-03-25 – 2015-03-26 (×3): 1 mg via INTRAVENOUS
  Filled 2015-03-25 (×3): qty 1

## 2015-03-25 MED ORDER — ALBUTEROL SULFATE (2.5 MG/3ML) 0.083% IN NEBU: 2.5000 mg | INHALATION_SOLUTION | Freq: Four times a day (QID) | RESPIRATORY_TRACT | Status: DC | PRN

## 2015-03-25 MED ORDER — DIPHENHYDRAMINE HCL 12.5 MG/5ML PO ELIX: 12.5000 mg | ORAL_SOLUTION | ORAL | Status: DC | PRN

## 2015-03-25 MED ORDER — 0.9 % SODIUM CHLORIDE (POUR BTL) OPTIME
TOPICAL | Status: DC | PRN
  Administered 2015-03-25: 1000 mL

## 2015-03-25 MED ORDER — LIDOCAINE HCL (CARDIAC) 20 MG/ML IV SOLN
INTRAVENOUS | Status: DC | PRN
  Administered 2015-03-25: 60 mg via INTRAVENOUS

## 2015-03-25 MED ORDER — METOCLOPRAMIDE HCL 5 MG/ML IJ SOLN: 5.0000 mg | Freq: Three times a day (TID) | INTRAMUSCULAR | Status: DC | PRN

## 2015-03-25 MED ORDER — BUPIVACAINE HCL (PF) 0.5 % IJ SOLN
INTRAMUSCULAR | Status: DC | PRN
  Administered 2015-03-25: 3 mL via INTRATHECAL

## 2015-03-25 MED ORDER — DEXTROSE-NACL 5-0.45 % IV SOLN: INTRAVENOUS | Status: DC

## 2015-03-25 MED ORDER — SENNOSIDES-DOCUSATE SODIUM 8.6-50 MG PO TABS
1.0000 | ORAL_TABLET | Freq: Every evening | ORAL | Status: DC | PRN
  Administered 2015-03-27: 1 via ORAL
  Filled 2015-03-25: qty 1

## 2015-03-25 MED ORDER — BISACODYL 5 MG PO TBEC: 5.0000 mg | DELAYED_RELEASE_TABLET | Freq: Every day | ORAL | Status: DC | PRN

## 2015-03-25 MED ORDER — ACETAMINOPHEN 325 MG PO TABS: 650.0000 mg | ORAL_TABLET | Freq: Four times a day (QID) | ORAL | Status: DC | PRN

## 2015-03-25 MED ORDER — PROPOFOL 10 MG/ML IV BOLUS
INTRAVENOUS | Status: DC | PRN
  Administered 2015-03-25: 20 mg via INTRAVENOUS

## 2015-03-25 MED ORDER — KCL IN DEXTROSE-NACL 20-5-0.45 MEQ/L-%-% IV SOLN
INTRAVENOUS | Status: DC
  Administered 2015-03-25: 125 mL/h via INTRAVENOUS
  Administered 2015-03-26 (×2): via INTRAVENOUS
  Filled 2015-03-25 (×2): qty 1000

## 2015-03-25 MED ORDER — FENTANYL CITRATE (PF) 250 MCG/5ML IJ SOLN
INTRAMUSCULAR | Status: AC
  Filled 2015-03-25: qty 5

## 2015-03-25 MED ORDER — MENTHOL 3 MG MT LOZG: 1.0000 | LOZENGE | OROMUCOSAL | Status: DC | PRN

## 2015-03-25 MED ORDER — OXYCODONE-ACETAMINOPHEN 5-325 MG PO TABS: 1.0000 | ORAL_TABLET | ORAL | Status: DC | PRN

## 2015-03-25 MED ORDER — ALBUTEROL SULFATE HFA 108 (90 BASE) MCG/ACT IN AERS
1.0000 | INHALATION_SPRAY | Freq: Four times a day (QID) | RESPIRATORY_TRACT | Status: DC | PRN
  Filled 2015-03-25: qty 6.7

## 2015-03-25 MED ORDER — METOCLOPRAMIDE HCL 5 MG PO TABS: 5.0000 mg | ORAL_TABLET | Freq: Three times a day (TID) | ORAL | Status: DC | PRN

## 2015-03-25 MED ORDER — LEVOTHYROXINE SODIUM 75 MCG PO TABS
75.0000 ug | ORAL_TABLET | Freq: Every day | ORAL | Status: DC
  Administered 2015-03-26 – 2015-03-27 (×2): 75 ug via ORAL
  Filled 2015-03-25 (×2): qty 1

## 2015-03-25 MED ORDER — SODIUM CHLORIDE 0.9 % IV SOLN
1000.0000 mg | INTRAVENOUS | Status: AC
  Administered 2015-03-25: 1000 mg via INTRAVENOUS
  Filled 2015-03-25: qty 10

## 2015-03-25 MED ORDER — ONDANSETRON HCL 4 MG PO TABS: 4.0000 mg | ORAL_TABLET | Freq: Four times a day (QID) | ORAL | Status: DC | PRN

## 2015-03-25 MED ORDER — FENTANYL CITRATE (PF) 100 MCG/2ML IJ SOLN
INTRAMUSCULAR | Status: AC
  Filled 2015-03-25: qty 2

## 2015-03-25 MED ORDER — TIZANIDINE HCL 2 MG PO TABS: 2.0000 mg | ORAL_TABLET | Freq: Four times a day (QID) | ORAL | Status: DC | PRN

## 2015-03-25 MED ORDER — OXYCODONE HCL 5 MG PO TABS
5.0000 mg | ORAL_TABLET | ORAL | Status: DC | PRN
  Administered 2015-03-25 (×3): 5 mg via ORAL
  Administered 2015-03-26 (×4): 10 mg via ORAL
  Administered 2015-03-26: 5 mg via ORAL
  Administered 2015-03-26 – 2015-03-27 (×4): 10 mg via ORAL
  Filled 2015-03-25 (×7): qty 2
  Filled 2015-03-25: qty 1
  Filled 2015-03-25: qty 2
  Filled 2015-03-25: qty 1

## 2015-03-25 MED ORDER — PHENYLEPHRINE HCL 10 MG/ML IJ SOLN
INTRAMUSCULAR | Status: DC | PRN
  Administered 2015-03-25: 40 ug via INTRAVENOUS
  Administered 2015-03-25: 80 ug via INTRAVENOUS
  Administered 2015-03-25: 40 ug via INTRAVENOUS
  Administered 2015-03-25: 80 ug via INTRAVENOUS

## 2015-03-25 MED ORDER — ACETAMINOPHEN 650 MG RE SUPP: 650.0000 mg | Freq: Four times a day (QID) | RECTAL | Status: DC | PRN

## 2015-03-25 MED ORDER — ONDANSETRON HCL 4 MG/2ML IJ SOLN: 4.0000 mg | Freq: Four times a day (QID) | INTRAMUSCULAR | Status: DC | PRN

## 2015-03-25 MED ORDER — CEFAZOLIN SODIUM-DEXTROSE 2-3 GM-% IV SOLR
2.0000 g | INTRAVENOUS | Status: AC
  Administered 2015-03-25: 2 g via INTRAVENOUS

## 2015-03-25 MED ORDER — PROMETHAZINE HCL 25 MG/ML IJ SOLN: 6.2500 mg | INTRAMUSCULAR | Status: DC | PRN

## 2015-03-25 MED ORDER — PROPOFOL 500 MG/50ML IV EMUL
INTRAVENOUS | Status: DC | PRN
Start: 2015-03-25 — End: 2015-03-25
  Administered 2015-03-25: 75 ug/kg/min via INTRAVENOUS

## 2015-03-25 MED ORDER — LACTATED RINGERS IV SOLN
INTRAVENOUS | Status: DC
  Administered 2015-03-25: 12:00:00 via INTRAVENOUS

## 2015-03-25 MED ORDER — MELOXICAM 7.5 MG PO TABS: 15.0000 mg | ORAL_TABLET | Freq: Every day | ORAL | Status: DC | PRN

## 2015-03-25 MED ORDER — ALUM & MAG HYDROXIDE-SIMETH 200-200-20 MG/5ML PO SUSP
30.0000 mL | ORAL | Status: DC | PRN
  Administered 2015-03-26: 30 mL via ORAL
  Filled 2015-03-25: qty 30

## 2015-03-25 MED ORDER — DEXAMETHASONE SODIUM PHOSPHATE 4 MG/ML IJ SOLN
INTRAMUSCULAR | Status: AC
  Filled 2015-03-25: qty 1

## 2015-03-25 MED ORDER — LACTATED RINGERS IV SOLN
INTRAVENOUS | Status: DC | PRN
  Administered 2015-03-25 (×2): via INTRAVENOUS

## 2015-03-25 MED ORDER — FLEET ENEMA 7-19 GM/118ML RE ENEM: 1.0000 | ENEMA | Freq: Once | RECTAL | Status: DC | PRN

## 2015-03-25 MED ORDER — PHENOL 1.4 % MT LIQD: 1.0000 | OROMUCOSAL | Status: DC | PRN

## 2015-03-25 SURGICAL SUPPLY — 51 items
BLADE SURG ROTATE 9660 (MISCELLANEOUS) IMPLANT
CAPT HIP TOTAL 2 ×1 IMPLANT
CELLS DAT CNTRL 66122 CELL SVR (MISCELLANEOUS) ×1 IMPLANT
COVER PERINEAL POST (MISCELLANEOUS) ×2 IMPLANT
COVER SURGICAL LIGHT HANDLE (MISCELLANEOUS) ×2 IMPLANT
DRAPE C-ARM 42X72 X-RAY (DRAPES) ×2 IMPLANT
DRAPE STERI IOBAN 125X83 (DRAPES) ×2 IMPLANT
DRAPE U-SHAPE 47X51 STRL (DRAPES) ×5 IMPLANT
DRSG AQUACEL AG ADV 3.5X10 (GAUZE/BANDAGES/DRESSINGS) ×2 IMPLANT
DURAPREP 26ML APPLICATOR (WOUND CARE) ×2 IMPLANT
ELECT BLADE 4.0 EZ CLEAN MEGAD (MISCELLANEOUS) ×2
ELECT REM PT RETURN 9FT ADLT (ELECTROSURGICAL) ×2
ELECTRODE BLDE 4.0 EZ CLN MEGD (MISCELLANEOUS) ×1 IMPLANT
ELECTRODE REM PT RTRN 9FT ADLT (ELECTROSURGICAL) ×1 IMPLANT
FACESHIELD WRAPAROUND (MASK) ×6 IMPLANT
FACESHIELD WRAPAROUND OR TEAM (MASK) ×2 IMPLANT
GLOVE BIO SURGEON STRL SZ7.5 (GLOVE) ×3 IMPLANT
GLOVE BIO SURGEON STRL SZ8.5 (GLOVE) ×4 IMPLANT
GLOVE BIOGEL PI IND STRL 8 (GLOVE) ×2 IMPLANT
GLOVE BIOGEL PI IND STRL 9 (GLOVE) ×1 IMPLANT
GLOVE BIOGEL PI INDICATOR 8 (GLOVE) ×2
GLOVE BIOGEL PI INDICATOR 9 (GLOVE) ×1
GLOVE SURG SS PI 6.0 STRL IVOR (GLOVE) ×1 IMPLANT
GOWN STRL REUS W/ TWL LRG LVL3 (GOWN DISPOSABLE) ×1 IMPLANT
GOWN STRL REUS W/ TWL XL LVL3 (GOWN DISPOSABLE) ×2 IMPLANT
GOWN STRL REUS W/TWL LRG LVL3 (GOWN DISPOSABLE) ×2
GOWN STRL REUS W/TWL XL LVL3 (GOWN DISPOSABLE) ×4
KIT BASIN OR (CUSTOM PROCEDURE TRAY) ×2 IMPLANT
KIT ROOM TURNOVER OR (KITS) ×2 IMPLANT
MANIFOLD NEPTUNE II (INSTRUMENTS) ×2 IMPLANT
NS IRRIG 1000ML POUR BTL (IV SOLUTION) ×2 IMPLANT
PACK TOTAL JOINT (CUSTOM PROCEDURE TRAY) ×2 IMPLANT
PACK UNIVERSAL I (CUSTOM PROCEDURE TRAY) ×2 IMPLANT
PAD ARMBOARD 7.5X6 YLW CONV (MISCELLANEOUS) ×3 IMPLANT
RETRACTOR WND ALEXIS 18 MED (MISCELLANEOUS) IMPLANT
RTRCTR WOUND ALEXIS 18CM MED (MISCELLANEOUS) ×2
SAW OSC TIP CART 19.5X105X1.3 (SAW) ×1 IMPLANT
SPONGE LAP 18X18 X RAY DECT (DISPOSABLE) ×4 IMPLANT
SUT ETHIBOND NAB CT1 #1 30IN (SUTURE) ×4 IMPLANT
SUT VIC AB 0 CT1 27 (SUTURE) ×2
SUT VIC AB 0 CT1 27XBRD ANBCTR (SUTURE) ×1 IMPLANT
SUT VIC AB 1 CT1 27 (SUTURE) ×2
SUT VIC AB 1 CT1 27XBRD ANBCTR (SUTURE) ×1 IMPLANT
SUT VIC AB 2-0 CT1 27 (SUTURE) ×2
SUT VIC AB 2-0 CT1 TAPERPNT 27 (SUTURE) ×1 IMPLANT
SUT VIC AB 3-0 PS2 18 (SUTURE) ×2
SUT VIC AB 3-0 PS2 18XBRD (SUTURE) ×1 IMPLANT
TOWEL OR 17X24 6PK STRL BLUE (TOWEL DISPOSABLE) ×2 IMPLANT
TOWEL OR 17X26 10 PK STRL BLUE (TOWEL DISPOSABLE) ×2 IMPLANT
TRAY FOLEY CATH 14FR (SET/KITS/TRAYS/PACK) IMPLANT
WATER STERILE IRR 1000ML POUR (IV SOLUTION) ×4 IMPLANT

## 2015-03-25 NOTE — Transfer of Care (Signed)
Immediate Anesthesia Transfer of Care Note  Patient: Janet Giles  Procedure(s) Performed: Procedure(s): TOTAL HIP ARTHROPLASTY ANTERIOR APPROACH (Right)  Patient Location: PACU  Anesthesia Type:General and Spinal  Level of Consciousness: awake and alert   Airway & Oxygen Therapy: Patient Spontanous Breathing and Patient connected to nasal cannula oxygen  Post-op Assessment: Report given to RN and Post -op Vital signs reviewed and stable  Post vital signs: Reviewed and stable  Last Vitals:  Filed Vitals:   03/25/15 1126  BP: 127/53  Pulse: 68  Temp: 36.4 C  Resp: 16    Complications: No apparent anesthesia complications

## 2015-03-25 NOTE — Discharge Instructions (Signed)

## 2015-03-25 NOTE — Anesthesia Preprocedure Evaluation (Addendum)
Anesthesia Evaluation  Patient identified by MRN, date of birth, ID band Patient awake    Reviewed: Allergy & Precautions, NPO status , Patient's Chart, lab work & pertinent test results  History of Anesthesia Complications (+) PONV, Family history of anesthesia reaction and history of anesthetic complications  Airway Mallampati: II  TM Distance: >3 FB Neck ROM: Full    Dental no notable dental hx.    Pulmonary shortness of breath, former smoker,    Pulmonary exam normal breath sounds clear to auscultation       Cardiovascular + Peripheral Vascular Disease  negative cardio ROS Normal cardiovascular exam Rhythm:Regular Rate:Normal     Neuro/Psych negative neurological ROS  negative psych ROS   GI/Hepatic Neg liver ROS, GERD  ,  Endo/Other  Hypothyroidism   Renal/GU negative Renal ROS     Musculoskeletal  (+) Arthritis ,   Abdominal   Peds  Hematology negative hematology ROS (+)   Anesthesia Other Findings   Reproductive/Obstetrics negative OB ROS                            Anesthesia Physical Anesthesia Plan  ASA: II  Anesthesia Plan: Spinal   Post-op Pain Management:    Induction:   Airway Management Planned:   Additional Equipment:   Intra-op Plan:   Post-operative Plan:   Informed Consent: I have reviewed the patients History and Physical, chart, labs and discussed the procedure including the risks, benefits and alternatives for the proposed anesthesia with the patient or authorized representative who has indicated his/her understanding and acceptance.   Dental advisory given  Plan Discussed with: CRNA  Anesthesia Plan Comments:         Anesthesia Quick Evaluation

## 2015-03-25 NOTE — Op Note (Signed)
OPERATIVE REPORT    DATE OF PROCEDURE:  03/25/2015       PREOPERATIVE DIAGNOSIS:  RIGHT HIP OSTEOARTHRITIS                                                          POSTOPERATIVE DIAGNOSIS:  RIGHT HIP OSTEOARTHRITIS                                                           PROCEDURE: Anterior R total hip arthroplasty using a 50 mm DePuy Pinnacle  Cup, [employer], 0-degree polyethylene liner, a +1.5 mm 59mm ceramic head, a 2H Depuy Triloc stem   SURGEON: Hollie Wojahn J    ASSISTANT:   Eric K. Barton Dubois  (present throughout entire procedure and necessary for timely completion of the procedure)   ANESTHESIA: Spinal, GLMA BLOOD LOSS: 400 FLUID REPLACEMENT: 1600 crystalloid Antibiotic: 2gm ancef Tranexamic Acid: 1gm iv COMPLICATIONS: none    INDICATIONS FOR PROCEDURE: A 74 y.o. year-old With  RIGHT HIP OSTEOARTHRITIS   for 4 years, x-rays show bone-on-bone arthritic changes, and osteophytes. Despite conservative measures with observation, anti-inflammatory medicine, narcotics, use of a cane, has severe unremitting pain and can ambulate only a few blocks before resting. Patient desires elective R total hip arthroplasty to decrease pain and increase function. The risks, benefits, and alternatives were discussed at length including but not limited to the risks of infection, bleeding, nerve injury, stiffness, blood clots, the need for revision surgery, cardiopulmonary complications, among others, and they were willing to proceed. Questions answered     PROCEDURE IN DETAIL: The patient was identified by armband,  received preoperative IV antibiotics in [facility] holding [facility], taken to the operating room , appropriate anesthetic monitors  were attached and  anesthesia was induced with the patienton the gurney. The HANA boots were applied to the feet and he was then transferred to the HANA table with a peroneal post and support underneath the non-operative le,  which was locked in 5 lb traction. Theoperative lower extremity was then prepped and draped in the usual sterile fashion from just above the iliac crest to the knee. And a timeout procedure was performed. We then made a 12 cm incision along the interval at the leading edge of the tensor fascia lata of starting at 2 cm lateral to and 2 cm distal to the ASIS. Small bleeders in the skin and subcutaneous tissue identified and cauterized we dissected down to the fascia and made an incision in the fascia allowing [place] to elevate the fascia of the tensor muscle and exploited the interval between the rectus and the tensor fascia lata. A Hohmann retractor was then placed along the superior neck of the femur and a Cobra retractor along the inferior neck of the femur we teed the capsule starting out at the superior anterior aspect of the acetabulum going distally and made the T along the neck both leaflets of the T were tagged with #2 Ethibond suture. Cobra retractors were then placed along the inferior and superior neck allowing [place] to perform a standard neck cut and removed the  femoral head with a power corkscrew. We then placed a right angle Hohmann retractor along the anterior aspect of the acetabulum a spiked Cobra in the cotyloid notch and posteriorly a Muelller retractor. We then sequentially reamed up to a 49 mm basket reamer obtaining good coverage in all quadrants, verified by C-arm imaging. Under C-arm control with and hammered into place a 54 mm Pinnacle cup in 45 of abduction and 15 of anteversion. The cup seated nicely and required no supplemental screws. We then placed a central hole Eliminator and a 0 polyethylene liner. The foot was then externally rotated to 100, the HANA elevator was placed around the flare of the greater trochanter and the limb was extended and abducted delivering the proximal femur up into the wound. A medium Hohmann retractor was placed over the greater trochanter and a Mueller retractor  along the posterior femoral neck completing the exposure. We then performed releases superiorly and and inferiorly of the capsule going back to the pirformis fossa superiorly and to the lesser trochanter inferiorly. We then entered the proximal femur with the box cutting offset chisel followed by, a canal sounder, the chili pepper and broaching up to a 2 broach. This seated nicely and we reamed the calcar. A trial reduction was performed with a 1.5 mm 67mm head.The limb lengths were excellent the hip was stable in 90 of external rotation. At this point the trial components removed and we hammered into place a # 2 Tri-Lock stem with G[employer] coating. This was a H offset stem and a + 32 mm ceramic ball was then hammered into place the hip was reduced and final C-arm images obtained. The wound was thoroughly irrigated with normal saline solution. We repaired the ant capsule and the tensor fascia lot a with running 0 vicryl suture. the subcutaneous tissue was closed with 2-0 and 3-0 Vicryl suture followed by an Aquacil dressing. At this point the patient was awaken and transferred to hospital gurney without difficulty. The subcutaneous tissue with 0 and 2-0 undyed Vicryl suture and the skin with running  3-0 vicryl subcuticular suture. Aquacil dressing was applied. The patient was then unclamped, rolled supine, awaken extubated and taken to recovery room without difficulty in stable condition.   Frederik Pear J 03/25/2015, 2:42 PM

## 2015-03-25 NOTE — Anesthesia Procedure Notes (Addendum)
Procedure Name: LMA Insertion Date/Time: 03/25/2015 1:37 PM Performed by: Raphael Gibney T Pre-anesthesia Checklist: Patient identified, Emergency Drugs available, Suction available, Patient being monitored and Timeout performed Patient Re-evaluated:Patient Re-evaluated prior to inductionOxygen Delivery Method: Circle system utilized and Simple face mask Preoxygenation: Pre-oxygenation with 100% oxygen Intubation Type: IV induction Ventilation: Mask ventilation without difficulty LMA: LMA inserted LMA Size: 4.0 Number of attempts: 1 Airway Equipment and Method: Patient positioned with wedge pillow Placement Confirmation: positive ETCO2 and breath sounds checked- equal and bilateral Tube secured with: Tape Dental Injury: Teeth and Oropharynx as per pre-operative assessment    Spinal Patient location during procedure: OR Staffing Anesthesiologist: Nolon Nations Performed by: anesthesiologist  Preanesthetic Checklist Completed: patient identified, site marked, surgical consent, pre-op evaluation, timeout performed, IV checked, risks and benefits discussed and monitors and equipment checked Spinal Block Patient position: sitting Prep: ChloraPrep Patient monitoring: heart rate, continuous pulse ox and blood pressure Approach: right paramedian Location: L2-3 Injection technique: single-shot Needle Needle type: Sprotte  Needle gauge: 24 G Needle length: 9 cm Additional Notes Expiration date of kit checked and confirmed. Patient tolerated procedure well, without complications. Incomplete anesthesia, LMA placed.

## 2015-03-25 NOTE — Interval H&P Note (Signed)
History and Physical Interval Note:  03/25/2015 12:54 PM  Janet Giles  has presented today for surgery, with the diagnosis of RIGHT HIP OSTEOARTHRITIS  The various methods of treatment have been discussed with the patient and family. After consideration of risks, benefits and other options for treatment, the patient has consented to  Procedure(s): TOTAL HIP ARTHROPLASTY ANTERIOR APPROACH (Right) as a surgical intervention .  The patient's history has been reviewed, patient examined, no change in status, stable for surgery.  I have reviewed the patient's chart and labs.  Questions were answered to the patient's satisfaction.     Kerin Salen

## 2015-03-26 ENCOUNTER — Encounter (HOSPITAL_COMMUNITY): Payer: Self-pay | Admitting: Orthopedic Surgery

## 2015-03-26 LAB — CBC
HCT: 26.5 % — ABNORMAL LOW (ref 36.0–46.0)
Hemoglobin: 8.8 g/dL — ABNORMAL LOW (ref 12.0–15.0)
MCH: 30.8 pg (ref 26.0–34.0)
MCHC: 33.2 g/dL (ref 30.0–36.0)
MCV: 92.7 fL (ref 78.0–100.0)
Platelets: 254 10*3/uL (ref 150–400)
RBC: 2.86 MIL/uL — ABNORMAL LOW (ref 3.87–5.11)
RDW: 12.7 % (ref 11.5–15.5)
WBC: 8.5 10*3/uL (ref 4.0–10.5)

## 2015-03-26 NOTE — Progress Notes (Signed)
Physical Therapy Treatment Note  Clinical Impression: Making good progress with transfers and progressive ambulation; stair training with pt and husband complete;  On track for dc home tomorrow;     03/26/15 1557  PT Visit Information  Last PT Received On 03/26/15  Assistance Needed +1  History of Present Illness Admitted for R THA;  has a past medical history of AAA (abdominal aortic aneurysm) ([place]); Hyperlipidemia; Degenerative joint disease of spine; Complication of anesthesia; PONV (postoperative nausea and vomiting); Family history of adverse reaction to anesthesia; Shortness of breath dyspnea; Hypothyroidism; and GERD (gastroesophageal reflux disease).  has past surgical history that includes Back surgery (02)  PT Time Calculation  PT Start Time (ACUTE ONLY) 1350  PT Stop Time (ACUTE ONLY) 1433  PT Time Calculation (min) (ACUTE ONLY) 43 min  Subjective Data  Patient Stated Goal Wants to be able to work in her yard  Precautions  Precautions Fall  Restrictions  RLE Weight Bearing WBAT  Pain Assessment  Pain Assessment Faces  Faces Pain Scale 4  Pain Location R hip  Pain Descriptors / Indicators Sore  Pain Intervention(s) Monitored during session;Repositioned  Cognition  Arousal/Alertness Awake/alert  Behavior During Therapy WFL for tasks assessed/performed  Overall Cognitive Status Within Functional Limits for tasks assessed  Bed Mobility  Overal bed mobility Needs Assistance  Bed Mobility Supine to Sit  Supine to sit Min assist  General bed mobility comments Cues for technique; pt's husband was present and provided appropriate assistance  Transfers  Overall transfer level Needs assistance  Equipment used Rolling walker (2 wheeled)  Transfers Sit to/from Stand  Sit to Stand Min assist;Min guard  General transfer comment cues for safety and hand placement; some decr control with stand to sit  Ambulation/Gait  Ambulation/Gait assistance Min guard (with and without  physical contact)  Ambulation Distance (Feet) 60 Feet  Assistive device Rolling walker (2 wheeled)  Gait Pattern/deviations Step-through pattern (emerging)  General Gait Details Cues for gait sequence and technique  Stairs Yes  Stairs assistance Min assist  Stair Management One rail Left;Step to pattern;Forwards  Number of Stairs 3  General stair comments Discussed options and tecniques; Opted for pt to use L UE on rail and RUE supported by husband; Mr. Pfohl gave appropriate assist and both he and Ms. Viscuso voiced confidence with their ability to negotiate stairs  PT - End of Session  Equipment Utilized During Treatment Gait belt  Activity Tolerance Patient tolerated treatment well  Patient left in chair;with call bell/phone within reach;with family/visitor present  Nurse Communication Mobility status  PT - Assessment/Plan  PT Plan Current plan remains appropriate  PT Frequency (ACUTE ONLY) 7X/week  Recommendations for Other Services OT consult  Follow Up Recommendations Home health PT;Supervision for mobility/OOB  PT equipment Rolling walker with 5" wheels;3in1 (PT)  PT Goal Progression  Progress towards PT goals Progressing toward goals  Acute Rehab PT Goals  PT Goal Formulation With patient  Time For Goal Achievement 04/02/15  Potential to Achieve Goals Good  PT General Charges  $$ ACUTE PT VISIT 1 Procedure  PT Treatments  $Gait Training 23-37 mins  $Therapeutic Activity 8-22 mins    Roney Marion, [place] Pager [phone] Office [phone]

## 2015-03-26 NOTE — Progress Notes (Signed)
Patient ID: Janet Giles, female   DOB: July 31, 1940, 74 y.o.   MRN: [id number] PATIENT ID: Janet Giles  MRN: [id number]  DOB/AGE:  [birth year] / 74 y.o.  1 Day Post-Op Procedure(s) (LRB): TOTAL HIP ARTHROPLASTY ANTERIOR APPROACH (Right)    PROGRESS NOTE Subjective: Patient is alert, oriented, no Nausea, no Vomiting, yes passing gas, . Taking PO well. Denies SOB, Chest or Calf Pain. Using Incentive Spirometer, PAS in place. Ambulate WBAT Patient reports pain as  3/10  .    Objective: Vital signs in last 24 hours: Filed Vitals:   03/25/15 1857 03/25/15 2049 03/26/15 0555 03/26/15 0600  BP: 107/47 109/45 86/45 98/45   Pulse: 64 86 85   Temp: 97.9 F (36.6 C) 98.1 F (36.7 C) 97.5 F (36.4 C)   TempSrc: Oral Oral Oral   Resp: 16 16 18    Height:      Weight:      SpO2: 95% 94% 97%       Intake/Output from previous day: I/O last 3 completed shifts: In: 1925 [I.V.:1925] Out: 1450 [Urine:1100; Blood:350]   Intake/Output this shift:     LABORATORY DATA:  Recent Labs  03/26/15 0539  WBC 8.5  HGB 8.8*  HCT 26.5*  PLT 254    Examination: Neurologically intact ABD soft Neurovascular intact Sensation intact distally Intact pulses distally Dorsiflexion/Plantar flexion intact Incision: dressing C/D/I No cellulitis present Compartment soft} XR AP&Lat of hip shows well placed\fixed THA  Assessment:   1 Day Post-Op Procedure(s) (LRB): TOTAL HIP ARTHROPLASTY ANTERIOR APPROACH (Right) ADDITIONAL DIAGNOSIS:  Expected Acute Blood Loss Anemia,   Plan: PT/OT WBAT, THA   DVT Prophylaxis: SCDx72 hrs, ASA 325 mg BID x 2 weeks  DISCHARGE PLAN: Home  DISCHARGE NEEDS: HHPT, CPM, Walker and 3-in-1 comode seat

## 2015-03-26 NOTE — Progress Notes (Signed)
Utilization review completed.  

## 2015-03-26 NOTE — Anesthesia Postprocedure Evaluation (Signed)
Anesthesia Post Note  Patient: Janet Giles  Procedure(s) Performed: Procedure(s) (LRB): TOTAL HIP ARTHROPLASTY ANTERIOR APPROACH (Right)  Anesthesia type: Spinal converted to general  Patient location: PACU  Post pain: Pain level controlled  Post assessment: Post-op Vital signs reviewed  Last Vitals: BP 98/45 mmHg  Pulse 85  Temp(Src) 36.4 C (Oral)  Resp 18  Ht 5' 4.5" (1.638 m)  Wt 146 lb 1.6 oz (66.271 kg)  BMI 24.70 kg/m2  SpO2 97%  Post vital signs: Reviewed  Level of consciousness: sedated  Complications: No apparent anesthesia complications

## 2015-03-26 NOTE — Care Management Note (Signed)
Case Management Note  Patient Details  Name: LACRICIA TOLLISON MRN: [id number] Date of Birth: August 29, 1940  Subjective/Objective:      S/p right total hip arthroplasty              Action/Plan: Set up with [facility] for HHPT by MD office. Spoke with patient and her husband, no change in discharge plan. Patient states that she has a rolling walker and a 3N1 at home. Patient states that her husband will be able to assist her after discharge. No other discharge needs identified.   Expected Discharge Date:                  Expected Discharge Plan:  [place]  In-House Referral:  NA  Discharge planning Services  CM Consult  Post Acute Care Choice:  Home Health Choice offered to:  Patient  DME Arranged:    DME Agency:     HH Arranged:  PT HH Agency:  [facility]  Status of Service:  Completed, signed off  Medicare Important Message Given:    Date Medicare IM Given:    Medicare IM give by:    Date Additional Medicare IM Given:    Additional Medicare Important Message give by:     If discussed at [place] of Stay Meetings, dates discussed:    Additional Comments:  Nila Nephew, RN 03/26/2015, 11:04 AM

## 2015-03-26 NOTE — Evaluation (Signed)
Physical Therapy Evaluation Patient Details Name: Janet Giles MRN: [id number] DOB: April 30, 1941 Today's Date: 03/26/2015   History of Present Illness  Admitted for R THA;  has a past medical history of AAA (abdominal aortic aneurysm) ([place]); Hyperlipidemia; Degenerative joint disease of spine; Complication of anesthesia; PONV (postoperative nausea and vomiting); Family history of adverse reaction to anesthesia; Shortness of breath dyspnea; Hypothyroidism; and GERD (gastroesophageal reflux disease).  has past surgical history that includes Back surgery (02)  Clinical Impression   Pt is s/p THA resulting in the deficits listed below (see PT Problem List).  Pt will benefit from skilled PT to increase their independence and safety with mobility to allow discharge to the venue listed below.      Follow Up Recommendations Home health PT;Supervision for mobility/OOB    Equipment Recommendations  Rolling walker with 5" wheels;3in1 (PT)    Recommendations for Other Services OT consult     Precautions / Restrictions Precautions Precautions: Fall Restrictions RLE Weight Bearing: Weight bearing as tolerated      Mobility  Bed Mobility Overal bed mobility: Needs Assistance Bed Mobility: Supine to Sit     Supine to sit: Min assist     General bed mobility comments: Cues for technqiue and min assist to support trunk as Ms. Pam Drown reciprocally scooted to EOB  Transfers Overall transfer level: Needs assistance Equipment used: Rolling walker (2 wheeled) Transfers: Sit to/from Stand Sit to Stand: Mod assist         General transfer comment: Light mod assist to power up; cues for safety and hand placement  Ambulation/Gait Ambulation/Gait assistance: Min assist;Min guard Ambulation Distance (Feet): 40 Feet Assistive device: Rolling walker (2 wheeled) Gait Pattern/deviations: Step-to pattern;Antalgic     General Gait Details: Cues for gait sequence and technique  Stairs             Wheelchair Mobility    Modified Rankin (Stroke Patients Only)       Balance                                             Pertinent Vitals/Pain Pain Assessment: 0-10 Pain Score: 8  Pain Location: R hip with weight bearing Pain Descriptors / Indicators: Sore Pain Intervention(s): Limited activity within patient's tolerance;Monitored during session;Repositioned    Home Living Family/patient expects to be discharged to:: Private residence Living Arrangements: Spouse/significant other Available Help at Discharge: Family;Available PRN/intermittently Type of Home: House Home Access: Stairs to enter Entrance Stairs-Rails: Right (need to verify side) Entrance Stairs-Number of Steps: 4 Home Layout: One level Home Equipment:  (to be determined)      Prior Function Level of Independence: Independent               Hand Dominance        Extremity/Trunk Assessment   Upper Extremity Assessment: Overall WFL for tasks assessed           Lower Extremity Assessment: RLE deficits/detail RLE Deficits / Details: Grossly decr aROM and strength, limited by pain    Cervical / Trunk Assessment: Normal  Communication   Communication: No difficulties  Cognition Arousal/Alertness: Awake/alert Behavior During Therapy: WFL for tasks assessed/performed Overall Cognitive Status: Within Functional Limits for tasks assessed                      General Comments  Exercises Total Joint Exercises Ankle Circles/Pumps: AROM;Both;10 reps Quad Sets: AROM;Right;10 reps Gluteal Sets: AROM;Both;10 reps Heel Slides: AAROM;Right;5 reps Hip ABduction/ADduction: AROM;Right;5 reps      Assessment/Plan    PT Assessment Patient needs continued PT services  PT Diagnosis Difficulty walking;Acute pain   PT Problem List Decreased strength;Decreased range of motion;Decreased activity tolerance;Decreased balance;Decreased mobility;Decreased knowledge  of use of DME;Pain  PT Treatment Interventions DME instruction;Gait training;Stair training;Functional mobility training;Therapeutic activities;Therapeutic exercise;Patient/family education   PT Goals (Current goals can be found in the Care Plan section) Acute Rehab PT Goals Patient Stated Goal: Wants to be able to work in her yard PT Goal Formulation: With patient Time For Goal Achievement: 04/02/15 Potential to Achieve Goals: Good    Frequency 7X/week   Barriers to discharge        Co-evaluation               End of Session Equipment Utilized During Treatment: Gait belt Activity Tolerance: Patient tolerated treatment well Patient left: in chair;with call bell/phone within reach;with family/visitor present Nurse Communication: Mobility status         Time: [id number] PT Time Calculation (min) (ACUTE ONLY): 24 min   Charges:   PT Evaluation $Initial PT Evaluation Tier I: 1 Procedure PT Treatments $Gait Training: 8-22 mins   PT G Codes:        Quin Hoop 03/26/2015, 12:33 PM  Roney Marion, [place] Pager [phone] Office [phone]

## 2015-03-26 NOTE — Evaluation (Signed)
Occupational Therapy Evaluation Patient Details Name: Janet Giles MRN: [id number] DOB: August 26, 1940 Today's Date: 03/26/2015    History of Present Illness Admitted for R THA;  has a past medical history of AAA (abdominal aortic aneurysm) ([place]); Hyperlipidemia; Degenerative joint disease of spine; Complication of anesthesia; PONV (postoperative nausea and vomiting); Family history of adverse reaction to anesthesia; Shortness of breath dyspnea; Hypothyroidism; and GERD (gastroesophageal reflux disease).  has past surgical history that includes Back surgery (02)   Clinical Impression   Pt was independent prior to admission.  Moving well for POD 1. Pt with all necessary DME and AE for ADL and is knowledgeable in its use.  Instructed in safety, transporting items safely with walker and in safe footwear.  Pt has a supportive husband to assist as needed.  No further OT needs.    Follow Up Recommendations  No OT follow up    Equipment Recommendations  None recommended by OT    Recommendations for Other Services       Precautions / Restrictions Precautions Precautions: Fall Restrictions RLE Weight Bearing: Weight bearing as tolerated      Mobility Bed Mobility Overal bed mobility: Needs Assistance Bed Mobility: Sit to Supine      Sit to supine: Min assist   General bed mobility comments: assisted R LE into bed, instructed in using L to assist R  Transfers Overall transfer level: Needs assistance Equipment used: Rolling walker (2 wheeled) Transfers: Sit to/from Stand Sit to Stand: Min guard         General transfer comment: no physical assist, min guard for safety    Balance                                            ADL Overall ADL's : Needs assistance/impaired Eating/Feeding: Independent;Sitting   Grooming: Wash/dry hands;Standing;Supervision/safety   Upper Body Bathing: Set up;Sitting   Lower Body Bathing: Minimal assistance;Sit to/from  stand Lower Body Bathing Details (indicate cue type and reason): pt is knowledgeable in use of long handled sponge Upper Body Dressing : Set up;Sitting Upper Body Dressing Details (indicate cue type and reason): changed gown Lower Body Dressing: Minimal assistance;Sit to/from stand Lower Body Dressing Details (indicate cue type and reason): instructed to dress R LE first, undress it last, pt is familiar with use of sock aide and reacher Toilet Transfer: Min guard;Ambulation;RW;Comfort height toilet   Toileting- Clothing Manipulation and Hygiene: Min guard;Sit to/from Nurse, children's Details (indicate cue type and reason): verbal instruction in technique for shower transfer, husband will supervise Functional mobility during ADLs: Min guard;Rolling walker       Vision     Perception     Praxis      Pertinent Vitals/Pain Pain Assessment: Faces Pain Score: 8  Faces Pain Scale: Hurts even more Pain Location: R hip Pain Descriptors / Indicators: Sore;Guarding;Grimacing Pain Intervention(s): Limited activity within patient's tolerance;Monitored during session;RN gave pain meds during session;Ice applied;Repositioned     Hand Dominance Right   Extremity/Trunk Assessment Upper Extremity Assessment Upper Extremity Assessment: Overall WFL for tasks assessed   Lower Extremity Assessment Lower Extremity Assessment: Defer to PT evaluation RLE Deficits / Details: Grossly decr aROM and strength, limited by pain   Cervical / Trunk Assessment Cervical / Trunk Assessment: Normal   Communication Communication Communication: No difficulties   Cognition Arousal/Alertness: Awake/alert Behavior During  Therapy: WFL for tasks assessed/performed Overall Cognitive Status: Within Functional Limits for tasks assessed                     General Comments       Exercises Exercises: Total Joint     Shoulder Instructions      Home Living Family/patient expects to be  discharged to:: Private residence Living Arrangements: Spouse/significant other Available Help at Discharge: Family;Available PRN/intermittently Type of Home: House Home Access: Stairs to enter [employer] of Steps: 4 Entrance Stairs-Rails: Right Home Layout: One level     Bathroom Shower/Tub: Occupational psychologist: Handicapped height     Home Equipment: Environmental consultant - 2 wheels;Bedside commode;Shower seat;Adaptive equipment Adaptive Equipment: Reacher;Sock aid;Long-handled sponge Additional Comments: pastor is loaning her a shower seat      Prior Functioning/Environment Level of Independence: Independent             OT Diagnosis: Generalized weakness;Acute pain   OT Problem List:     OT Treatment/Interventions:      OT Goals(Current goals can be found in the care plan section) Acute Rehab OT Goals Patient Stated Goal: Wants to be able to work in her yard  OT Frequency:     Barriers to D/C:            Co-evaluation              End of Session Equipment Utilized During Treatment: Arts administrator Communication:  (ok to put pt's gown on from home)  Activity Tolerance: Patient tolerated treatment well Patient left: in bed;with call bell/phone within reach;with family/visitor present   Time: [id number] OT Time Calculation (min): 23 min Charges:  OT General Charges $OT Visit: 1 Procedure OT Evaluation $Initial OT Evaluation Tier I: 1 Procedure OT Treatments $Self Care/Home Management : 8-22 mins G-Codes:    Malka So 03/26/2015, 3:55 PM  [phone]

## 2015-03-27 LAB — CBC
HCT: 25.5 % — ABNORMAL LOW (ref 36.0–46.0)
Hemoglobin: 8.7 g/dL — ABNORMAL LOW (ref 12.0–15.0)
MCH: 32.1 pg (ref 26.0–34.0)
MCHC: 34.1 g/dL (ref 30.0–36.0)
MCV: 94.1 fL (ref 78.0–100.0)
Platelets: 245 10*3/uL (ref 150–400)
RBC: 2.71 MIL/uL — ABNORMAL LOW (ref 3.87–5.11)
RDW: 13.3 % (ref 11.5–15.5)
WBC: 10.7 10*3/uL — ABNORMAL HIGH (ref 4.0–10.5)

## 2015-03-27 NOTE — Discharge Summary (Signed)
Patient ID: Janet Giles MRN: [id number] DOB/AGE: [birth year] 74 y.o.  Admit date: 03/25/2015 Discharge date: 03/27/2015  Admission Diagnoses:  Principal Problem:   Primary osteoarthritis of right hip   Discharge Diagnoses:  Same  Past Medical History  Diagnosis Date  . AAA (abdominal aortic aneurysm) ([place])   . Hyperlipidemia   . Degenerative joint disease of spine   . Complication of anesthesia     trouble breathing after back surgery  . PONV (postoperative nausea and vomiting)   . Family history of adverse reaction to anesthesia     one sister nasea and vomiting  . Shortness of breath dyspnea     uses inhaler occ  . Hypothyroidism   . GERD (gastroesophageal reflux disease)     occ    Surgeries: Procedure(s): TOTAL HIP ARTHROPLASTY ANTERIOR APPROACH on 03/25/2015   Consultants:    Discharged Condition: Improved  Hospital Course: Janet Giles is an 74 y.o. female who was admitted 03/25/2015 for operative treatment ofPrimary osteoarthritis of right hip. Patient has severe unremitting pain that affects sleep, daily activities, and work/hobbies. After pre-op clearance the patient was taken to the operating room on 03/25/2015 and underwent  Procedure(s): TOTAL HIP ARTHROPLASTY ANTERIOR APPROACH.    Patient was given perioperative antibiotics: Anti-infectives    Start     Dose/Rate Route Frequency Ordered Stop   03/25/15 1245  ceFAZolin (ANCEF) IVPB 2 g/50 mL premix     2 g 100 mL/hr over 30 Minutes Intravenous To ShortStay Surgical 03/25/15 1138 03/25/15 1310   03/25/15 1140  ceFAZolin (ANCEF) 2-3 GM-% IVPB SOLR    Comments:  Janet Giles   : cabinet override      03/25/15 1140 03/25/15 2344       Patient was given sequential compression devices, early ambulation, and chemoprophylaxis to prevent DVT.  Patient benefited maximally from hospital stay and there were no complications.    Recent vital signs: Patient Vitals for the past 24 hrs:  BP Temp Temp  src Pulse Resp SpO2  03/27/15 0601 (!) 116/49 mmHg 98.6 F (37 C) Oral 78 18 94 %  03/26/15 2005 (!) 128/53 mmHg 98 F (36.7 C) Oral 80 17 91 %  03/26/15 1159 (!) 111/54 mmHg 98.1 F (36.7 C) Oral 75 18 93 %     Recent laboratory studies:  Recent Labs  03/26/15 0539 03/27/15 0533  WBC 8.5 10.7*  HGB 8.8* 8.7*  HCT 26.5* 25.5*  PLT 254 245     Discharge Medications:     Medication List    STOP taking these medications        HYDROcodone-acetaminophen 5-325 MG tablet  Commonly known as:  NORCO/VICODIN     meloxicam 15 MG tablet  Commonly known as:  MOBIC      TAKE these medications        albuterol 108 (90 BASE) MCG/ACT inhaler  Commonly known as:  PROVENTIL HFA;VENTOLIN HFA  Inhale 1 puff into the lungs every 6 (six) hours as needed for wheezing or shortness of breath.     aspirin EC 325 MG tablet  Take 1 tablet (325 mg total) by mouth 2 (two) times daily.     CALCIUM + D PO  Take 1 tablet by mouth 2 (two) times daily.     [employer] BIOTIN PO  Take 1 tablet by mouth daily.     estradiol 0.5 MG tablet  Commonly known as:  ESTRACE  Take 1 tablet by mouth daily.  gabapentin 300 MG capsule  Commonly known as:  NEURONTIN  Take 1 capsule by mouth 2 (two) times daily.     levothyroxine 75 MCG tablet  Commonly known as:  SYNTHROID, LEVOTHROID  Take 1 tablet by mouth daily.     oxyCODONE-acetaminophen 5-325 MG tablet  Commonly known as:  ROXICET  Take 1 tablet by mouth every 4 (four) hours as needed.     tiZANidine 2 MG tablet  Commonly known as:  ZANAFLEX  Take 1 tablet (2 mg total) by mouth every 6 (six) hours as needed for muscle spasms.        Diagnostic Studies: Dg Chest 2 View  03/13/2015  CLINICAL DATA:  Preoperative examination prior to total hip arthroplasty ; history of previous smoking. EXAM: CHEST  2 VIEW COMPARISON:  PA and lateral chest x-ray of January 09, 2014 FINDINGS: The lungs are mildly hyperinflated with hemidiaphragm flattening and  increased AP dimension of the thorax. The heart and pulmonary vascularity are normal. The mediastinum is normal in width. There is no pleural effusion. The bony thorax is unremarkable. IMPRESSION: Mild hyperinflation may reflect reactive airway disease or COPD. There is no acute cardiopulmonary abnormality. Electronically Signed   By: Janet  Giles M.D.   On: 03/13/2015 11:06   Dg Hip Operative Unilat With Pelvis Right  03/25/2015  CLINICAL DATA:  Operative imaging during right hip arthroplasty. EXAM: OPERATIVE RIGHT HIP (WITH PELVIS IF PERFORMED) 2 VIEWS TECHNIQUE: Fluoroscopic spot image(s) were submitted for interpretation post-operatively. COMPARISON:  None. FINDINGS: Right hip arthroplasty is well-seated and aligned on the 2 submitted images. There is no acute fracture or evidence of an operative complication. IMPRESSION: Well-aligned right hip arthroplasty. Electronically Signed   By: Janet Giles M.D.   On: 03/25/2015 15:02    Disposition:       Discharge Instructions    Call MD / Call 911    Complete by:  As directed   If you experience chest pain or shortness of breath, CALL 911 and be transported to the hospital emergency room.  If you develope a fever above 101 F, pus (white drainage) or increased drainage or redness at the wound, or calf pain, call your surgeon's office.     Change dressing    Complete by:  As directed   You may change your dressing on day 5, then change the dressing daily with sterile 4 x 4 inch gauze dressing and paper tape.  You may clean the incision with alcohol prior to redressing     Constipation Prevention    Complete by:  As directed   Drink plenty of fluids.  Prune juice may be helpful.  You may use a stool softener, such as Colace (over the counter) 100 mg twice a day.  Use MiraLax (over the counter) for constipation as needed.     Diet - low sodium heart healthy    Complete by:  As directed      Discharge instructions    Complete by:  As directed    Follow up in clinic in 2 weeks with Dr. Mayer Giles     Driving restrictions    Complete by:  As directed   No driving for 2 weeks     Follow the hip precautions as taught in Physical Therapy    Complete by:  As directed      Increase activity slowly as tolerated    Complete by:  As directed      Patient may shower  Complete by:  As directed   You may shower without a dressing once there is no drainage.  Do not wash over the wound.  If drainage remains, cover wound with plastic wrap and then shower.           Follow-up Information    Follow up with Kerin Salen, MD In 2 weeks.   Specialty:  Orthopedic Surgery   Contact information:   [place] [address] [phone]       Follow up with [facility].   Why:  They will contact you to schedule home therapy visits.   Contact information:   [address] [place] [address] [phone]        Signed: Hardin Negus, Kirrah Mustin R 03/27/2015, 8:01 AM

## 2015-03-27 NOTE — Progress Notes (Signed)
Physical Therapy Treatment Patient Details Name: Janet Giles MRN: [id number] DOB: March 17, 1941 Today's Date: 03/27/2015    History of Present Illness Admitted for R THA;  has a past medical history of AAA (abdominal aortic aneurysm) ([place]); Hyperlipidemia; Degenerative joint disease of spine; Complication of anesthesia; PONV (postoperative nausea and vomiting); Family history of adverse reaction to anesthesia; Shortness of breath dyspnea; Hypothyroidism; and GERD (gastroesophageal reflux disease).  has past surgical history that includes Back surgery (02)    PT Comments    Noting continuing improvements in moving, especially with bed mobility; Pt is confident in her ability to manage at home, as am I; OK for dc home from PT standpoint   Follow Up Recommendations  Home health PT;Supervision for mobility/OOB     Equipment Recommendations  Rolling walker with 5" wheels;3in1 (PT)    Recommendations for Other Services OT consult     Precautions / Restrictions Precautions Precautions: Fall Precaution Comments: Fall risk greatly reduced Restrictions Weight Bearing Restrictions: Yes RLE Weight Bearing: Weight bearing as tolerated    Mobility  Bed Mobility Overal bed mobility: Needs Assistance Bed Mobility: Supine to Sit     Supine to sit: Supervision     General bed mobility comments: Took time to problem-solve through ways of getting up; attempted to use belt to assist RLE, but ultimately Janet Giles had enough control of RLE to not need it; no physical assist needed today  Transfers Overall transfer level: Needs assistance Equipment used: Rolling walker (2 wheeled) Transfers: Sit to/from Stand Sit to Stand: Supervision         General transfer comment: Better control of stand to sit  Ambulation/Gait Ambulation/Gait assistance: Supervision Ambulation Distance (Feet): 120 Feet Assistive device: Rolling walker (2 wheeled) Gait Pattern/deviations: Step-through  pattern Gait velocity: somewhat slow   General Gait Details: Cues for gait sequence and technique   Stairs            Wheelchair Mobility    Modified Rankin (Stroke Patients Only)       Balance                                    Cognition Arousal/Alertness: Awake/alert Behavior During Therapy: WFL for tasks assessed/performed Overall Cognitive Status: Within Functional Limits for tasks assessed                      Exercises Total Joint Exercises Ankle Circles/Pumps: AROM;Both;10 reps Quad Sets: AROM;Right;10 reps Gluteal Sets: AROM;Both;10 reps Towel Squeeze: AROM;Both;10 reps Short Arc Quad: AROM;Both;20 reps Heel Slides: AAROM;Right;10 reps Hip ABduction/ADduction: AAROM;Right;10 reps    General Comments        Pertinent Vitals/Pain Pain Assessment: Faces Faces Pain Scale: Hurts a little bit Pain Location: R hip with therex Pain Descriptors / Indicators: Aching;Grimacing;Sore Pain Intervention(s): Monitored during session    Home Living                      Prior Function            PT Goals (current goals can now be found in the care plan section) Acute Rehab PT Goals Patient Stated Goal: Wants to be able to work in her yard PT Goal Formulation: With patient Time For Goal Achievement: 04/02/15 Potential to Achieve Goals: Good Progress towards PT goals: Progressing toward goals    Frequency  7X/week    PT Plan Current  plan remains appropriate    Co-evaluation             End of Session   Activity Tolerance: Patient tolerated treatment well Patient left: Other (comment) (in bathroom with husband, readying for shower)     Time: 0900-0940 PT Time Calculation (min) (ACUTE ONLY): 40 min  Charges:  $Gait Training: 8-22 mins $Therapeutic Exercise: 8-22 mins $Therapeutic Activity: 8-22 mins                    G Codes:      Janet Giles 03/27/2015, 10:59 AM  Janet Giles, [place] Pager [phone] Office [phone]

## 2015-03-27 NOTE — Progress Notes (Signed)
PATIENT ID: Janet Giles  MRN: [id number]  DOB/AGE:  1940-11-29 / 74 y.o.  2 Days Post-Op Procedure(s) (LRB): TOTAL HIP ARTHROPLASTY ANTERIOR APPROACH (Right)    PROGRESS NOTE Subjective: Patient is alert, oriented, no Nausea, no Vomiting, yes passing gas, . Taking PO well. Denies SOB, Chest or Calf Pain. Using Incentive Spirometer, PAS in place. Ambulate WBAT withpt walking 50 ft and practicing stairs yesterday Patient reports pain as  2/10  .    Objective: Vital signs in last 24 hours: Filed Vitals:   03/26/15 0600 03/26/15 1159 03/26/15 2005 03/27/15 0601  BP: 98/45 111/54 128/53 116/49  Pulse:  75 80 78  Temp:  98.1 F (36.7 C) 98 F (36.7 C) 98.6 F (37 C)  TempSrc:  Oral Oral Oral  Resp:  18 17 18   Height:      Weight:      SpO2:  93% 91% 94%      Intake/Output from previous day: I/O last 3 completed shifts: In: 3835 [P.O.:1560; I.V.:2275] Out: 400 [Urine:400]   Intake/Output this shift:     LABORATORY DATA:  Recent Labs  03/26/15 0539 03/27/15 0533  WBC 8.5 10.7*  HGB 8.8* 8.7*  HCT 26.5* 25.5*  PLT 254 245    Examination: Neurologically intact Neurovascular intact Sensation intact distally Intact pulses distally Dorsiflexion/Plantar flexion intact Incision: dressing C/D/I No cellulitis present Compartment soft} XR AP&Lat of hip shows well placed\fixed THA  Assessment:   2 Days Post-Op Procedure(s) (LRB): TOTAL HIP ARTHROPLASTY ANTERIOR APPROACH (Right) ADDITIONAL DIAGNOSIS:  Expected Acute Blood Loss Anemia,  Plan: PT/OT WBAT, THA  precautions  DVT Prophylaxis: SCDx72 hrs, ASA 325 mg BID x 2 weeks  DISCHARGE PLAN: Home  DISCHARGE NEEDS: HHPT, Walker and 3-in-1 comode seat

## 2015-03-28 DIAGNOSIS — Z96641 Presence of right artificial hip joint: Secondary | ICD-10-CM | POA: Diagnosis not present

## 2015-03-28 DIAGNOSIS — I714 Abdominal aortic aneurysm, without rupture: Secondary | ICD-10-CM | POA: Diagnosis not present

## 2015-03-28 DIAGNOSIS — Z471 Aftercare following joint replacement surgery: Secondary | ICD-10-CM | POA: Diagnosis not present

## 2015-03-28 DIAGNOSIS — K219 Gastro-esophageal reflux disease without esophagitis: Secondary | ICD-10-CM | POA: Diagnosis not present

## 2015-03-28 DIAGNOSIS — E039 Hypothyroidism, unspecified: Secondary | ICD-10-CM | POA: Diagnosis not present

## 2015-03-28 DIAGNOSIS — E785 Hyperlipidemia, unspecified: Secondary | ICD-10-CM | POA: Diagnosis not present

## 2015-03-29 DIAGNOSIS — Z96641 Presence of right artificial hip joint: Secondary | ICD-10-CM | POA: Diagnosis not present

## 2015-03-29 DIAGNOSIS — E039 Hypothyroidism, unspecified: Secondary | ICD-10-CM | POA: Diagnosis not present

## 2015-03-29 DIAGNOSIS — E785 Hyperlipidemia, unspecified: Secondary | ICD-10-CM | POA: Diagnosis not present

## 2015-03-29 DIAGNOSIS — I714 Abdominal aortic aneurysm, without rupture: Secondary | ICD-10-CM | POA: Diagnosis not present

## 2015-03-29 DIAGNOSIS — Z471 Aftercare following joint replacement surgery: Secondary | ICD-10-CM | POA: Diagnosis not present

## 2015-03-29 DIAGNOSIS — K219 Gastro-esophageal reflux disease without esophagitis: Secondary | ICD-10-CM | POA: Diagnosis not present

## 2015-04-02 DIAGNOSIS — Z96641 Presence of right artificial hip joint: Secondary | ICD-10-CM | POA: Diagnosis not present

## 2015-04-02 DIAGNOSIS — E785 Hyperlipidemia, unspecified: Secondary | ICD-10-CM | POA: Diagnosis not present

## 2015-04-02 DIAGNOSIS — K219 Gastro-esophageal reflux disease without esophagitis: Secondary | ICD-10-CM | POA: Diagnosis not present

## 2015-04-02 DIAGNOSIS — Z471 Aftercare following joint replacement surgery: Secondary | ICD-10-CM | POA: Diagnosis not present

## 2015-04-02 DIAGNOSIS — E039 Hypothyroidism, unspecified: Secondary | ICD-10-CM | POA: Diagnosis not present

## 2015-04-02 DIAGNOSIS — I714 Abdominal aortic aneurysm, without rupture: Secondary | ICD-10-CM | POA: Diagnosis not present

## 2015-04-03 DIAGNOSIS — I714 Abdominal aortic aneurysm, without rupture: Secondary | ICD-10-CM | POA: Diagnosis not present

## 2015-04-03 DIAGNOSIS — K219 Gastro-esophageal reflux disease without esophagitis: Secondary | ICD-10-CM | POA: Diagnosis not present

## 2015-04-03 DIAGNOSIS — Z96641 Presence of right artificial hip joint: Secondary | ICD-10-CM | POA: Diagnosis not present

## 2015-04-03 DIAGNOSIS — E039 Hypothyroidism, unspecified: Secondary | ICD-10-CM | POA: Diagnosis not present

## 2015-04-03 DIAGNOSIS — E785 Hyperlipidemia, unspecified: Secondary | ICD-10-CM | POA: Diagnosis not present

## 2015-04-03 DIAGNOSIS — Z471 Aftercare following joint replacement surgery: Secondary | ICD-10-CM | POA: Diagnosis not present

## 2015-04-05 DIAGNOSIS — K219 Gastro-esophageal reflux disease without esophagitis: Secondary | ICD-10-CM | POA: Diagnosis not present

## 2015-04-05 DIAGNOSIS — E039 Hypothyroidism, unspecified: Secondary | ICD-10-CM | POA: Diagnosis not present

## 2015-04-05 DIAGNOSIS — I714 Abdominal aortic aneurysm, without rupture: Secondary | ICD-10-CM | POA: Diagnosis not present

## 2015-04-05 DIAGNOSIS — Z96641 Presence of right artificial hip joint: Secondary | ICD-10-CM | POA: Diagnosis not present

## 2015-04-05 DIAGNOSIS — Z471 Aftercare following joint replacement surgery: Secondary | ICD-10-CM | POA: Diagnosis not present

## 2015-04-05 DIAGNOSIS — E785 Hyperlipidemia, unspecified: Secondary | ICD-10-CM | POA: Diagnosis not present

## 2015-04-08 DIAGNOSIS — E039 Hypothyroidism, unspecified: Secondary | ICD-10-CM | POA: Diagnosis not present

## 2015-04-08 DIAGNOSIS — Z471 Aftercare following joint replacement surgery: Secondary | ICD-10-CM | POA: Diagnosis not present

## 2015-04-08 DIAGNOSIS — I714 Abdominal aortic aneurysm, without rupture: Secondary | ICD-10-CM | POA: Diagnosis not present

## 2015-04-08 DIAGNOSIS — E785 Hyperlipidemia, unspecified: Secondary | ICD-10-CM | POA: Diagnosis not present

## 2015-04-08 DIAGNOSIS — K219 Gastro-esophageal reflux disease without esophagitis: Secondary | ICD-10-CM | POA: Diagnosis not present

## 2015-04-08 DIAGNOSIS — Z96641 Presence of right artificial hip joint: Secondary | ICD-10-CM | POA: Diagnosis not present

## 2015-04-09 DIAGNOSIS — M25551 Pain in right hip: Secondary | ICD-10-CM | POA: Diagnosis not present

## 2015-04-09 DIAGNOSIS — Z96641 Presence of right artificial hip joint: Secondary | ICD-10-CM | POA: Diagnosis not present

## 2015-04-16 DIAGNOSIS — M25551 Pain in right hip: Secondary | ICD-10-CM | POA: Diagnosis not present

## 2015-04-16 DIAGNOSIS — Z96641 Presence of right artificial hip joint: Secondary | ICD-10-CM | POA: Diagnosis not present

## 2015-04-18 DIAGNOSIS — Z96641 Presence of right artificial hip joint: Secondary | ICD-10-CM | POA: Diagnosis not present

## 2015-04-18 DIAGNOSIS — M25551 Pain in right hip: Secondary | ICD-10-CM | POA: Diagnosis not present

## 2015-04-23 DIAGNOSIS — Z96641 Presence of right artificial hip joint: Secondary | ICD-10-CM | POA: Diagnosis not present

## 2015-04-23 DIAGNOSIS — M25551 Pain in right hip: Secondary | ICD-10-CM | POA: Diagnosis not present

## 2015-04-25 DIAGNOSIS — M25551 Pain in right hip: Secondary | ICD-10-CM | POA: Diagnosis not present

## 2015-04-25 DIAGNOSIS — Z96641 Presence of right artificial hip joint: Secondary | ICD-10-CM | POA: Diagnosis not present

## 2015-04-30 DIAGNOSIS — M25551 Pain in right hip: Secondary | ICD-10-CM | POA: Diagnosis not present

## 2015-04-30 DIAGNOSIS — Z96641 Presence of right artificial hip joint: Secondary | ICD-10-CM | POA: Diagnosis not present

## 2015-05-07 ENCOUNTER — Other Ambulatory Visit: Payer: Self-pay

## 2015-05-07 DIAGNOSIS — Z96641 Presence of right artificial hip joint: Secondary | ICD-10-CM | POA: Diagnosis not present

## 2015-05-07 DIAGNOSIS — M25551 Pain in right hip: Secondary | ICD-10-CM | POA: Diagnosis not present

## 2015-05-07 DIAGNOSIS — Z1231 Encounter for screening mammogram for malignant neoplasm of breast: Secondary | ICD-10-CM

## 2015-05-14 DIAGNOSIS — M47816 Spondylosis without myelopathy or radiculopathy, lumbar region: Secondary | ICD-10-CM | POA: Diagnosis not present

## 2015-05-31 ENCOUNTER — Ambulatory Visit: Payer: Medicare Other | Admitting: Family

## 2015-05-31 ENCOUNTER — Other Ambulatory Visit (HOSPITAL_COMMUNITY): Payer: Medicare Other

## 2015-06-04 ENCOUNTER — Ambulatory Visit
Admission: RE | Admit: 2015-06-04 | Discharge: 2015-06-04 | Disposition: A | Discharge status: Home / Self Care | Payer: Medicare Other | Source: Ambulatory Visit

## 2015-06-04 DIAGNOSIS — Z1231 Encounter for screening mammogram for malignant neoplasm of breast: Secondary | ICD-10-CM

## 2015-06-13 DIAGNOSIS — H04123 Dry eye syndrome of bilateral lacrimal glands: Secondary | ICD-10-CM | POA: Diagnosis not present

## 2015-06-21 DIAGNOSIS — M21622 Bunionette of left foot: Secondary | ICD-10-CM | POA: Diagnosis not present

## 2015-06-21 DIAGNOSIS — M65871 Other synovitis and tenosynovitis, right ankle and foot: Secondary | ICD-10-CM | POA: Diagnosis not present

## 2015-08-07 DIAGNOSIS — G609 Hereditary and idiopathic neuropathy, unspecified: Secondary | ICD-10-CM | POA: Diagnosis not present

## 2015-08-07 DIAGNOSIS — M7742 Metatarsalgia, left foot: Secondary | ICD-10-CM | POA: Diagnosis not present

## 2015-08-07 DIAGNOSIS — M7741 Metatarsalgia, right foot: Secondary | ICD-10-CM | POA: Diagnosis not present

## 2015-08-13 DIAGNOSIS — M47816 Spondylosis without myelopathy or radiculopathy, lumbar region: Secondary | ICD-10-CM | POA: Diagnosis not present

## 2015-08-14 DIAGNOSIS — E039 Hypothyroidism, unspecified: Secondary | ICD-10-CM | POA: Diagnosis not present

## 2015-08-14 DIAGNOSIS — J45909 Unspecified asthma, uncomplicated: Secondary | ICD-10-CM | POA: Diagnosis not present

## 2015-08-14 DIAGNOSIS — M8580 Other specified disorders of bone density and structure, unspecified site: Secondary | ICD-10-CM | POA: Diagnosis not present

## 2015-08-14 DIAGNOSIS — E7800 Pure hypercholesterolemia, unspecified: Secondary | ICD-10-CM | POA: Diagnosis not present

## 2015-08-14 DIAGNOSIS — R209 Unspecified disturbances of skin sensation: Secondary | ICD-10-CM | POA: Diagnosis not present

## 2015-08-14 DIAGNOSIS — J309 Allergic rhinitis, unspecified: Secondary | ICD-10-CM | POA: Diagnosis not present

## 2015-08-14 DIAGNOSIS — G629 Polyneuropathy, unspecified: Secondary | ICD-10-CM | POA: Diagnosis not present

## 2015-09-18 DIAGNOSIS — M79609 Pain in unspecified limb: Secondary | ICD-10-CM | POA: Diagnosis not present

## 2015-09-18 DIAGNOSIS — M7741 Metatarsalgia, right foot: Secondary | ICD-10-CM | POA: Diagnosis not present

## 2015-09-18 DIAGNOSIS — G609 Hereditary and idiopathic neuropathy, unspecified: Secondary | ICD-10-CM | POA: Diagnosis not present

## 2015-09-18 DIAGNOSIS — M7742 Metatarsalgia, left foot: Secondary | ICD-10-CM | POA: Diagnosis not present

## 2015-09-27 DIAGNOSIS — L570 Actinic keratosis: Secondary | ICD-10-CM | POA: Diagnosis not present

## 2015-09-27 DIAGNOSIS — L720 Epidermal cyst: Secondary | ICD-10-CM | POA: Diagnosis not present

## 2015-09-27 DIAGNOSIS — L821 Other seborrheic keratosis: Secondary | ICD-10-CM | POA: Diagnosis not present

## 2015-09-27 DIAGNOSIS — D692 Other nonthrombocytopenic purpura: Secondary | ICD-10-CM | POA: Diagnosis not present

## 2015-09-27 DIAGNOSIS — D2271 Melanocytic nevi of right lower limb, including hip: Secondary | ICD-10-CM | POA: Diagnosis not present

## 2015-09-27 DIAGNOSIS — L820 Inflamed seborrheic keratosis: Secondary | ICD-10-CM | POA: Diagnosis not present

## 2015-09-27 DIAGNOSIS — D485 Neoplasm of uncertain behavior of skin: Secondary | ICD-10-CM | POA: Diagnosis not present

## 2015-10-09 DIAGNOSIS — I8311 Varicose veins of right lower extremity with inflammation: Secondary | ICD-10-CM | POA: Diagnosis not present

## 2015-10-09 DIAGNOSIS — I8312 Varicose veins of left lower extremity with inflammation: Secondary | ICD-10-CM | POA: Diagnosis not present

## 2015-10-16 DIAGNOSIS — G909 Disorder of the autonomic nervous system, unspecified: Secondary | ICD-10-CM | POA: Diagnosis not present

## 2015-10-16 DIAGNOSIS — Z4789 Encounter for other orthopedic aftercare: Secondary | ICD-10-CM | POA: Diagnosis not present

## 2015-10-16 DIAGNOSIS — Z89422 Acquired absence of other left toe(s): Secondary | ICD-10-CM | POA: Diagnosis not present

## 2015-10-16 DIAGNOSIS — M7741 Metatarsalgia, right foot: Secondary | ICD-10-CM | POA: Diagnosis not present

## 2015-10-23 DIAGNOSIS — I8312 Varicose veins of left lower extremity with inflammation: Secondary | ICD-10-CM | POA: Diagnosis not present

## 2015-11-21 DIAGNOSIS — L570 Actinic keratosis: Secondary | ICD-10-CM | POA: Diagnosis not present

## 2015-11-21 DIAGNOSIS — D1722 Benign lipomatous neoplasm of skin and subcutaneous tissue of left arm: Secondary | ICD-10-CM | POA: Diagnosis not present

## 2015-11-21 DIAGNOSIS — L821 Other seborrheic keratosis: Secondary | ICD-10-CM | POA: Diagnosis not present

## 2015-11-26 DIAGNOSIS — I8311 Varicose veins of right lower extremity with inflammation: Secondary | ICD-10-CM | POA: Diagnosis not present

## 2015-11-28 DIAGNOSIS — M5137 Other intervertebral disc degeneration, lumbosacral region: Secondary | ICD-10-CM | POA: Diagnosis not present

## 2015-11-28 DIAGNOSIS — M2550 Pain in unspecified joint: Secondary | ICD-10-CM | POA: Diagnosis not present

## 2015-11-28 DIAGNOSIS — M65331 Trigger finger, right middle finger: Secondary | ICD-10-CM | POA: Diagnosis not present

## 2015-11-28 DIAGNOSIS — M79642 Pain in left hand: Secondary | ICD-10-CM | POA: Diagnosis not present

## 2015-11-28 DIAGNOSIS — M79671 Pain in right foot: Secondary | ICD-10-CM | POA: Diagnosis not present

## 2015-11-28 DIAGNOSIS — M1812 Unilateral primary osteoarthritis of first carpometacarpal joint, left hand: Secondary | ICD-10-CM | POA: Diagnosis not present

## 2015-11-28 DIAGNOSIS — Z79899 Other long term (current) drug therapy: Secondary | ICD-10-CM | POA: Diagnosis not present

## 2015-11-28 DIAGNOSIS — M79641 Pain in right hand: Secondary | ICD-10-CM | POA: Diagnosis not present

## 2015-11-28 DIAGNOSIS — R21 Rash and other nonspecific skin eruption: Secondary | ICD-10-CM | POA: Diagnosis not present

## 2015-11-28 DIAGNOSIS — M79672 Pain in left foot: Secondary | ICD-10-CM | POA: Diagnosis not present

## 2015-11-29 DIAGNOSIS — M1812 Unilateral primary osteoarthritis of first carpometacarpal joint, left hand: Secondary | ICD-10-CM | POA: Diagnosis not present

## 2015-11-29 DIAGNOSIS — M79641 Pain in right hand: Secondary | ICD-10-CM | POA: Diagnosis not present

## 2015-11-29 DIAGNOSIS — M5137 Other intervertebral disc degeneration, lumbosacral region: Secondary | ICD-10-CM | POA: Diagnosis not present

## 2015-11-29 DIAGNOSIS — M79671 Pain in right foot: Secondary | ICD-10-CM | POA: Diagnosis not present

## 2015-11-29 DIAGNOSIS — M65331 Trigger finger, right middle finger: Secondary | ICD-10-CM | POA: Diagnosis not present

## 2015-11-29 DIAGNOSIS — R21 Rash and other nonspecific skin eruption: Secondary | ICD-10-CM | POA: Diagnosis not present

## 2015-12-05 DIAGNOSIS — M7061 Trochanteric bursitis, right hip: Secondary | ICD-10-CM | POA: Diagnosis not present

## 2015-12-17 DIAGNOSIS — M7071 Other bursitis of hip, right hip: Secondary | ICD-10-CM | POA: Diagnosis not present

## 2015-12-17 DIAGNOSIS — I7300 Raynaud's syndrome without gangrene: Secondary | ICD-10-CM | POA: Diagnosis not present

## 2015-12-17 DIAGNOSIS — M359 Systemic involvement of connective tissue, unspecified: Secondary | ICD-10-CM | POA: Diagnosis not present

## 2015-12-17 DIAGNOSIS — M79671 Pain in right foot: Secondary | ICD-10-CM | POA: Diagnosis not present

## 2015-12-18 DIAGNOSIS — I8311 Varicose veins of right lower extremity with inflammation: Secondary | ICD-10-CM | POA: Diagnosis not present

## 2015-12-19 DIAGNOSIS — Z79899 Other long term (current) drug therapy: Secondary | ICD-10-CM | POA: Diagnosis not present

## 2015-12-19 MED FILL — HYDROXYCHLOROQUINE 200 MG T: 200 | 84 days supply | Qty: 120 | Fill #0

## 2016-01-06 DIAGNOSIS — R102 Pelvic and perineal pain: Secondary | ICD-10-CM | POA: Diagnosis not present

## 2016-01-06 DIAGNOSIS — M62838 Other muscle spasm: Secondary | ICD-10-CM | POA: Diagnosis not present

## 2016-01-06 DIAGNOSIS — R278 Other lack of coordination: Secondary | ICD-10-CM | POA: Diagnosis not present

## 2016-01-06 DIAGNOSIS — M6281 Muscle weakness (generalized): Secondary | ICD-10-CM | POA: Diagnosis not present

## 2016-01-06 DIAGNOSIS — R1031 Right lower quadrant pain: Secondary | ICD-10-CM | POA: Diagnosis not present

## 2016-01-06 DIAGNOSIS — R1032 Left lower quadrant pain: Secondary | ICD-10-CM | POA: Diagnosis not present

## 2016-01-15 DIAGNOSIS — R278 Other lack of coordination: Secondary | ICD-10-CM | POA: Diagnosis not present

## 2016-01-15 DIAGNOSIS — R1031 Right lower quadrant pain: Secondary | ICD-10-CM | POA: Diagnosis not present

## 2016-01-15 DIAGNOSIS — R1032 Left lower quadrant pain: Secondary | ICD-10-CM | POA: Diagnosis not present

## 2016-01-15 DIAGNOSIS — R102 Pelvic and perineal pain: Secondary | ICD-10-CM | POA: Diagnosis not present

## 2016-01-15 DIAGNOSIS — M6281 Muscle weakness (generalized): Secondary | ICD-10-CM | POA: Diagnosis not present

## 2016-01-22 DIAGNOSIS — R278 Other lack of coordination: Secondary | ICD-10-CM | POA: Diagnosis not present

## 2016-01-22 DIAGNOSIS — M6281 Muscle weakness (generalized): Secondary | ICD-10-CM | POA: Diagnosis not present

## 2016-01-22 DIAGNOSIS — R1032 Left lower quadrant pain: Secondary | ICD-10-CM | POA: Diagnosis not present

## 2016-01-22 DIAGNOSIS — R102 Pelvic and perineal pain: Secondary | ICD-10-CM | POA: Diagnosis not present

## 2016-01-22 DIAGNOSIS — R1031 Right lower quadrant pain: Secondary | ICD-10-CM | POA: Diagnosis not present

## 2016-01-31 DIAGNOSIS — Z79899 Other long term (current) drug therapy: Secondary | ICD-10-CM | POA: Diagnosis not present

## 2016-02-04 DIAGNOSIS — M62838 Other muscle spasm: Secondary | ICD-10-CM | POA: Diagnosis not present

## 2016-02-04 DIAGNOSIS — R102 Pelvic and perineal pain: Secondary | ICD-10-CM | POA: Diagnosis not present

## 2016-02-04 DIAGNOSIS — R1032 Left lower quadrant pain: Secondary | ICD-10-CM | POA: Diagnosis not present

## 2016-02-04 DIAGNOSIS — R1031 Right lower quadrant pain: Secondary | ICD-10-CM | POA: Diagnosis not present

## 2016-02-04 DIAGNOSIS — R278 Other lack of coordination: Secondary | ICD-10-CM | POA: Diagnosis not present

## 2016-02-15 ENCOUNTER — Other Ambulatory Visit: Payer: Self-pay | Admitting: Radiology

## 2016-02-15 DIAGNOSIS — Z79899 Other long term (current) drug therapy: Secondary | ICD-10-CM

## 2016-02-25 DIAGNOSIS — R1084 Generalized abdominal pain: Secondary | ICD-10-CM | POA: Diagnosis not present

## 2016-03-02 DIAGNOSIS — Z79899 Other long term (current) drug therapy: Secondary | ICD-10-CM | POA: Diagnosis not present

## 2016-03-03 ENCOUNTER — Telehealth: Payer: Self-pay | Admitting: Radiology

## 2016-03-03 NOTE — Telephone Encounter (Signed)
I have called patient to advise labs are normal  

## 2016-03-05 MED FILL — HYDROXYCHLOROQUINE 200 MG T: 200 | 84 days supply | Qty: 120 | Fill #1

## 2016-03-11 DIAGNOSIS — R110 Nausea: Secondary | ICD-10-CM | POA: Diagnosis not present

## 2016-03-11 DIAGNOSIS — R630 Anorexia: Secondary | ICD-10-CM | POA: Diagnosis not present

## 2016-03-11 DIAGNOSIS — R143 Flatulence: Secondary | ICD-10-CM | POA: Diagnosis not present

## 2016-03-11 DIAGNOSIS — Z800 Family history of malignant neoplasm of digestive organs: Secondary | ICD-10-CM | POA: Diagnosis not present

## 2016-03-11 DIAGNOSIS — R140 Abdominal distension (gaseous): Secondary | ICD-10-CM | POA: Diagnosis not present

## 2016-03-12 ENCOUNTER — Encounter (HOSPITAL_COMMUNITY): Payer: Self-pay | Admitting: *Deleted

## 2016-03-15 NOTE — Progress Notes (Signed)
*IMAGE* Office Visit Note  Patient: Janet Giles             Date of Birth: Dec 29, 1940           MRN: [id number]             PCP: Donnie Coffin, MD Referring: Aurea Graff.Marlou Sa, MD Visit Date: 03/19/2016 Occupation:@GUAROCC @    Subjective:  Follow-up Follow-up on Raynaud's, autoimmune disease, osteoarthritis  History of Present Illness: Janet Giles is a 75 y.o. female  Last office visit 12/17/2015===>HPI:  The patient presents today for a new patient followup.  She was initially seen by Dr. Estanislado Pandy on November 28, 2015, at which time she had symptoms including Raynaud's and neuropathy of the feet.  She comes in today to review her labs and her updated diagnosis.  The patient states that there is no change in her symptoms since the last visit.  She continues to feel that her feet are having some swelling.  It also changes colors at times.  She is also complaining of pain in her "sit bone."  This is consistent with ischial bursitis.  Her right 3rd trigger finger is no longer painful after the injection received on the last visit.   Last office visit 12/17/2015===>IMPRESSION/PLAN:   1.  Raynaud's of the feet with neuropathy.  Ongoing. 2.  Abnormal labs.  Positive ANA with a titer of 1:160.  Double-stranded DNA is 5.  Otherwise, the ENA is negative.  The patient is using sunscreen because she describes episodes of photosensitivity. 3.  High-risk prescription.  She is not on any now, but we offered her Plaquenil and the patient is wanting to start Plaquenil.  We also advised her to use enteric-coated aspirin for her Raynaud's, 81 mg, and the patient is agreeable. 4.  OA of the hands and feet per x-rays done in July 2017. 5.  Right 3rd trigger finger is doing better after the injection by Dr. Estanislado Pandy in July 2017. 6.  The patient states that she has lupus, but we do not see any evidence of that. 7.  The patient needs to get Pneumococcal vaccine. 8.  Handout on lupus. 9.  Plaquenil eye  exam baseline and every 6 months.  We discussed the risks and benefit of Plaquenil in detail. 10.  Consent on Plaquenil. 11.  Prescription for Plaquenil 200 mg b.i.d. Monday through Friday, 90-day supply with a refill.  Note the patient is 5 feet 4 inches so she needs to get about 300 mg of Plaquenil per day 7 days per week. 12.  CBC with diff and CMP with GFR at 1 month, 3 months and then every 5 months. 13.  Refer to Ileana Roup for physical therapy for ischial bursitis, evaluate and treat. 14.  Permanent handicap sticker. 15.  Return to clinic in 3 months.   Doing well with the Plaquenil at the moment. Taking 200 mg in the morning and 200 mg at night. Taking it Monday through Friday. Most recently she had Plaquenil refilled but accidentally the prescription was thrown away in the trash. He should is requesting a refill on the Plaquenil which we will be happy to do. I will per the prescription and she can take it over to [facility] and get it filled for about $40 with [email or web address]. We will give her 90 day supply total. When this prescription runs out she will be able to get the refill through her insurance company.  She was having some pain in her "sit  bone". He sent her to [facility]. That physical therapy was not helping much and so she quit going. She is having significant amount of pain and was referred to a gastroenterologist due to hernia question on exam by her PCP. That was ruled out. She also had a colonoscopy and endoscopy which were all normal. She continues to have right hip pain and pain when she sits down or does any kind of maneuver of her pelvis and she thinks that the right total hip replacement done a year ago may be a problem. This surgery was done by Dr. Mayer Camel. Patient plans to see him to see if it could be related to that surgery.  Patient's Raynaud's is doing well. She does take her enteric-coated aspirin daily. No flare at the moment. The weather is just now turning cold and it may  flare during the colder days. But so far no flares.  Patient's autoimmune diseases doing well. No oral or nasal ulcers. She does have fatigue for which she is taking vitamin D supplements daily. She has upcoming physical exam through Dr. Marlou Sa Mitchell's office coming Junior 2018 and they will do vitamin D levels as well as other labs at that time.  She does well with the gabapentin which was originally prescribed by her foot doctor for neuropathy in her feet. She does not see her foot doctor anymore and she is requesting a refill. I will refill at this time but she can get the further refills from Dr. Alroy Dust in the future. She is agreeable.  Patient has had Plaquenil eye exam done approximately July 2017 at [facility]. They told her everything was normal and to return back annually and were agreeable with that unless she has some kind of a eye issue. We would like to get the documentation from the eye doctor and the patient has turning her request to them and the blank form but they haven't sent him back to our office yet. She will call in soon and asked him to go ahead and send the original I form back to [place] so we know that the Plaquenil eye exam was done and everything is normal.  Activities of Daily Living:  Patient reports morning stiffness for 15 minutes.   Patient Reports nocturnal pain.  Difficulty dressing/grooming: Reports Difficulty climbing stairs: Reports Difficulty getting out of chair: Reports Difficulty using hands for taps, buttons, cutlery, and/or writing: Reports   Review of Systems  Constitutional: Negative for fatigue.  HENT: Negative for mouth sores and mouth dryness.   Eyes: Negative for dryness.  Respiratory: Negative for shortness of breath.   Gastrointestinal: Negative for constipation and diarrhea.  Musculoskeletal: Negative for myalgias and myalgias.  Skin: Negative for sensitivity to sunlight.  Psychiatric/Behavioral: Negative for  decreased concentration and sleep disturbance.       Last office visit 12/17/2015===>REVIEW OF SYSTEMS:  Systemic review is significant for pain and tenderness.  No dry mouth.  Some dry eyes.  No fatigue, easy bruising, or malar rash.  No alopecia, psoriasis, or skin tightening.  No weakness or other rashes, photosensitivity or skin ulcers.  No visual abnormalities.  No diarrhea.  No constipation. No wheezing or asthma.  Some Raynaud's.  Some dry eyes and some pain and tenderness.  Some morning stiffness.  Some trouble with stairs, chair, and toilet.  No trouble with tabs, buttons, cutlery and writing.  No nocturnal pain.  Please refer to the rheumatology visit form for details.  PMFS History:  Patient Active Problem List   Diagnosis Date Noted  . Primary osteoarthritis of right hip 03/24/2015  . 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 AAA (abdominal aortic aneurysm) ([place])   . Complication of anesthesia    trouble breathing after back surgery  . Degenerative joint disease of spine    buttocks pain -remains a problem  . Family history of adverse reaction to anesthesia    one sister nasea and vomiting  . GERD (gastroesophageal reflux disease)    occ  . Hyperlipidemia   . Hypothyroidism   . Lupus    Dr. Aura Fey  . Neuromuscular disorder ([facility])    neuropathy in feet  . PONV (postoperative nausea and vomiting)   . Shortness of breath dyspnea    uses inhaler occ    Family History  Problem Relation Age of Onset  . Diabetes Mother   . Heart disease Mother   . Heart attack Mother   . Bleeding Disorder Father   . Heart disease Sister   . Heart attack Sister   . Cancer Sister   . Deep vein thrombosis Sister   . Diabetes Sister   . Hyperlipidemia Sister   . Hypertension Sister   . Varicose Veins Sister   . AAA (abdominal aortic aneurysm) Sister   . Bleeding Disorder Sister   . Heart disease Brother   . Heart attack  Brother   . Hypertension Brother    Past Surgical History:  Procedure Laterality Date  . ABDOMINAL HYSTERECTOMY    . BACK SURGERY  02   L4-L5- fusion  . COLONOSCOPY WITH PROPOFOL N/A 03/17/2016   Procedure: COLONOSCOPY WITH PROPOFOL;  Surgeon: Garlan Fair, MD;  Location: [facility];  Service: Endoscopy;  Laterality: N/A;  . ESOPHAGOGASTRODUODENOSCOPY (EGD) WITH PROPOFOL N/A 03/17/2016   Procedure: ESOPHAGOGASTRODUODENOSCOPY (EGD) WITH PROPOFOL;  Surgeon: Garlan Fair, MD;  Location: [facility];  Service: Endoscopy;  Laterality: N/A;  . JOINT REPLACEMENT    . TONSILLECTOMY AND ADENOIDECTOMY    . TOTAL HIP ARTHROPLASTY Right 03/25/2015   Procedure: TOTAL HIP ARTHROPLASTY ANTERIOR APPROACH;  Surgeon: Frederik Pear, MD;  Location: [facility];  Service: Orthopedics;  Laterality: Right;   Social History   Social History Narrative  . No narrative on file   Last office visit 12/17/2015===>SOCIAL HISTORY:  Ex-smoker.  Quit smoking in 2002.  Drinks coffee 2 cups daily.  Does not exercise.     Last office visit 12/17/2015===>CURRENT MEDICATIONS:  Gabapentin, levothyroxine and supplements.   Last office visit 12/17/2015===>MEDICATION ALLERGIES:  NO KNOWN DRUG ALLERGIES except for SULFA and NSAIDs.  Objective: Vital Signs: BP (!) 144/77 (BP Location: Left Arm, Patient Position: Sitting, Cuff Size: Large)   Pulse 85   Resp 13   Ht 5' 4.5" (1.638 m)   Wt 149 lb (67.6 kg)   BMI 25.18 kg/m    Physical Exam  Constitutional: She is oriented to person, place, and time. She appears well-developed and well-nourished.  HENT:  Head: Normocephalic and atraumatic.  Eyes: EOM are normal. Pupils are equal, round, and reactive to light.  Cardiovascular: Normal rate, regular rhythm and normal heart sounds.  Exam reveals no gallop and no friction rub.   No murmur heard. Pulmonary/Chest: Effort normal and breath sounds normal. She has no wheezes. She has no rales.  Abdominal: Soft. Bowel sounds are  normal. She exhibits no distension. There is no tenderness. There is no guarding. No hernia.  Musculoskeletal: Normal  range of motion. She exhibits no edema, tenderness or deformity.  Lymphadenopathy:    She has no cervical adenopathy.  Neurological: She is alert and oriented to person, place, and time. Coordination normal.  Skin: Skin is warm and dry. Capillary refill takes less than 2 seconds. No rash noted.  Psychiatric: She has a normal mood and affect. Her behavior is normal.  Nursing note and vitals reviewed.    Musculoskeletal Exam:  Full range of motion of all joints Grip strength is equal and strong bilaterally Fibromyalgia Tender Points:  All absent.   CDAI Exam: No CDAI exam completed.    Investigation: Findings:     ============== Labs from 01/31/2016 shows CMP with GFR normal except for elevated creatinine 1.18. We will monitor.  GFR is slightly low at 45 which we will monitor. CBC with differential is normal except for low RBCs at 3.48, low hemoglobin at 10.4, low hematocrit at 31.0 (we will monitor.) Were getting CBC with differential CMP with GFR today in office  =================   INVESTIGATIONS:  Labs from November 28, 2015, show double-stranded DNA to be 5. The rest of the autoimmune workup is negative.  C3 and C4 normal.  Lupus anticoagulant negative.  Beta-2 glycoprotein negative.  Anticardiolipin negative.  Cryoglobulin negative.  G6PD is normal.  Please see labs for full details.  There is also additional labs from October 11, 2015, that should be reviewed for completeness.   X-rays of bilateral hands and feet were done on visit before last visit.   =================    Imaging: No results found.  Speciality Comments: No specialty comments available.    Procedures:  No procedures performed Allergies: Wasp venom   Assessment / Plan: Visit Diagnoses: Autoimmune disease ([place]) - +ANA, (dsDNA =13 November 2015),( [id number] ENA NEGATIVE)  High risk  medications (not anticoagulants) long-term use - PLQ 200 mg BID MON - FRIDAY - Plan: CBC with Differential/Platelet, COMPLETE METABOLIC PANEL WITH GFR  Osteoarthritis of both hands, unspecified osteoarthritis type  Osteoarthritis of ankle and foot, unspecified laterality    We will do CBC with differential CMP with GFR at the next visit in 5 months from today. We will do CBC with differential CMP with GFR today in office  Patient will see Dr. Mayer Camel because of her ongoing buttocks pain/right hip pain. His had a right total hip replacement through his office and it could be that hardware that needs attention. Tender to physical therapy that was not effective and so patient stopped going.  Pt will see Dr. Donnie Coffin for physical exam January 2018. She is a little bit fatigued and she'll get vitamin D levels through his office as well as further workup for her fatigue.  We will refill her gabapentin today and the next refill will come from Dr. Alroy Dust. She will speak to him regarding this.   I will refill her Plaquenil she lost original prescription and accidentally threw it in the trash. She will be without medication soon so I will go ahead and refill the Plaquenil she carried to [facility].I'll give her 90 day supply with a refill  Orders: Orders Placed This Encounter  Procedures  . CBC with Differential/Platelet  . COMPLETE METABOLIC PANEL WITH GFR   Meds ordered this encounter  Medications  . aspirin EC 81 MG tablet    Sig: Take 81 mg by mouth daily.  . hydroxychloroquine (PLAQUENIL) 200 MG tablet    Sig: Take 1 tablet (200 mg total) by mouth 2 (two) times daily.  Takes orally Monday through Friday.    Dispense:  180 tablet    Refill:  1    Order Specific Question:   Supervising Provider    Answer:   Bo Merino [[id number]]  . gabapentin (NEURONTIN) 300 MG capsule    Sig: Take 1 capsule (300 mg total) by mouth 2 (two) times daily.    Dispense:  180 capsule    Refill:  0    Order  Specific Question:   Supervising Provider    Answer:   Bo Merino [phone]    Face-to-face time spent with patient was 30 minutes. 50% of time was spent in counseling and coordination of care.  Follow-Up Instructions: Return in about 5 months (around 08/17/2016) for AutoImmune Dz, HRRX (plq), OA Hand, OA Feet.   I examined and evaluated the patient with Eliezer Lofts PA. The plan of care was discussed as noted above.  Bo Merino, MD

## 2016-03-17 ENCOUNTER — Ambulatory Visit (HOSPITAL_COMMUNITY)
Admission: RE | Admit: 2016-03-17 | Discharge: 2016-03-17 | Disposition: A | Discharge status: Home / Self Care | Payer: Medicare Other | Source: Ambulatory Visit | Attending: Gastroenterology | Admitting: Gastroenterology

## 2016-03-17 ENCOUNTER — Encounter (HOSPITAL_COMMUNITY)
Admission: RE | Disposition: A | Discharge status: Home / Self Care | Payer: Self-pay | Source: Ambulatory Visit | Attending: Gastroenterology

## 2016-03-17 ENCOUNTER — Ambulatory Visit (HOSPITAL_COMMUNITY): Payer: Medicare Other | Admitting: Registered Nurse

## 2016-03-17 ENCOUNTER — Encounter (HOSPITAL_COMMUNITY): Payer: Self-pay

## 2016-03-17 DIAGNOSIS — Z79899 Other long term (current) drug therapy: Secondary | ICD-10-CM | POA: Diagnosis not present

## 2016-03-17 DIAGNOSIS — K921 Melena: Secondary | ICD-10-CM | POA: Insufficient documentation

## 2016-03-17 DIAGNOSIS — M1611 Unilateral primary osteoarthritis, right hip: Secondary | ICD-10-CM | POA: Diagnosis not present

## 2016-03-17 DIAGNOSIS — E039 Hypothyroidism, unspecified: Secondary | ICD-10-CM | POA: Insufficient documentation

## 2016-03-17 DIAGNOSIS — R197 Diarrhea, unspecified: Secondary | ICD-10-CM | POA: Insufficient documentation

## 2016-03-17 DIAGNOSIS — L930 Discoid lupus erythematosus: Secondary | ICD-10-CM | POA: Insufficient documentation

## 2016-03-17 DIAGNOSIS — Z79890 Hormone replacement therapy (postmenopausal): Secondary | ICD-10-CM | POA: Insufficient documentation

## 2016-03-17 DIAGNOSIS — Z1211 Encounter for screening for malignant neoplasm of colon: Secondary | ICD-10-CM | POA: Diagnosis not present

## 2016-03-17 DIAGNOSIS — Z96641 Presence of right artificial hip joint: Secondary | ICD-10-CM | POA: Diagnosis not present

## 2016-03-17 DIAGNOSIS — R1084 Generalized abdominal pain: Secondary | ICD-10-CM | POA: Insufficient documentation

## 2016-03-17 DIAGNOSIS — Z87891 Personal history of nicotine dependence: Secondary | ICD-10-CM | POA: Diagnosis not present

## 2016-03-17 DIAGNOSIS — K5710 Diverticulosis of small intestine without perforation or abscess without bleeding: Secondary | ICD-10-CM | POA: Diagnosis not present

## 2016-03-17 DIAGNOSIS — R630 Anorexia: Secondary | ICD-10-CM | POA: Diagnosis not present

## 2016-03-17 DIAGNOSIS — J449 Chronic obstructive pulmonary disease, unspecified: Secondary | ICD-10-CM | POA: Diagnosis not present

## 2016-03-17 DIAGNOSIS — Z7982 Long term (current) use of aspirin: Secondary | ICD-10-CM | POA: Insufficient documentation

## 2016-03-17 DIAGNOSIS — K5730 Diverticulosis of large intestine without perforation or abscess without bleeding: Secondary | ICD-10-CM | POA: Diagnosis not present

## 2016-03-17 DIAGNOSIS — R140 Abdominal distension (gaseous): Secondary | ICD-10-CM | POA: Diagnosis not present

## 2016-03-17 DIAGNOSIS — R110 Nausea: Secondary | ICD-10-CM | POA: Diagnosis not present

## 2016-03-17 DIAGNOSIS — Z800 Family history of malignant neoplasm of digestive organs: Secondary | ICD-10-CM | POA: Insufficient documentation

## 2016-03-17 HISTORY — PX: COLONOSCOPY WITH PROPOFOL: SHX5780

## 2016-03-17 HISTORY — PX: ESOPHAGOGASTRODUODENOSCOPY (EGD) WITH PROPOFOL: SHX5813

## 2016-03-17 HISTORY — DX: Myoneural disorder, unspecified: G70.9

## 2016-03-17 HISTORY — DX: Systemic lupus erythematosus, unspecified: M32.9

## 2016-03-17 HISTORY — DX: Reserved for concepts with insufficient information to code with codable children: IMO0002

## 2016-03-17 SURGERY — ESOPHAGOGASTRODUODENOSCOPY (EGD) WITH PROPOFOL
Anesthesia: Monitor Anesthesia Care

## 2016-03-17 MED ORDER — ONDANSETRON HCL 4 MG/2ML IJ SOLN
INTRAMUSCULAR | Status: DC | PRN
  Administered 2016-03-17: 4 mg via INTRAVENOUS

## 2016-03-17 MED ORDER — PROPOFOL 10 MG/ML IV BOLUS
INTRAVENOUS | Status: DC | PRN
  Administered 2016-03-17 (×3): 20 mg via INTRAVENOUS

## 2016-03-17 MED ORDER — PROPOFOL 500 MG/50ML IV EMUL
INTRAVENOUS | Status: DC | PRN
  Administered 2016-03-17: 100 ug/kg/min via INTRAVENOUS

## 2016-03-17 MED ORDER — LIDOCAINE 2% (20 MG/ML) 5 ML SYRINGE
INTRAMUSCULAR | Status: DC | PRN
  Administered 2016-03-17: 100 mg via INTRAVENOUS

## 2016-03-17 MED ORDER — LIDOCAINE 2% (20 MG/ML) 5 ML SYRINGE
INTRAMUSCULAR | Status: AC
  Filled 2016-03-17: qty 5

## 2016-03-17 MED ORDER — PROPOFOL 10 MG/ML IV BOLUS
INTRAVENOUS | Status: AC
  Filled 2016-03-17: qty 20

## 2016-03-17 MED ORDER — ONDANSETRON HCL 4 MG/2ML IJ SOLN
INTRAMUSCULAR | Status: AC
  Filled 2016-03-17: qty 2

## 2016-03-17 MED ORDER — LACTATED RINGERS IV SOLN
INTRAVENOUS | Status: DC
  Administered 2016-03-17: 1000 mL via INTRAVENOUS

## 2016-03-17 SURGICAL SUPPLY — 25 items

## 2016-03-17 NOTE — Anesthesia Preprocedure Evaluation (Signed)
Anesthesia Evaluation  Patient identified by MRN, date of birth, ID band Patient awake    Reviewed: Allergy & Precautions, NPO status , Patient's Chart, lab work & pertinent test results  History of Anesthesia Complications (+) PONV, Family history of anesthesia reaction and history of anesthetic complications  Airway Mallampati: III  TM Distance: >3 FB Neck ROM: Full    Dental no notable dental hx. (+) Teeth Intact   Pulmonary shortness of breath and with exertion, former smoker,    Pulmonary exam normal breath sounds clear to auscultation       Cardiovascular + Peripheral Vascular Disease  Normal cardiovascular exam Rhythm:Regular Rate:Normal  AAA   Neuro/Psych  Neuromuscular disease    GI/Hepatic GERD  Medicated and Controlled,Bloating Diarrhea   Endo/Other  Hypothyroidism   Renal/GU   negative genitourinary   Musculoskeletal  (+) Arthritis , Osteoarthritis,    Abdominal   Peds  Hematology SLE   Anesthesia Other Findings   Reproductive/Obstetrics                             Lab Results  Component Value Date   WBC 10.7 (H) 03/27/2015   HGB 8.7 (L) 03/27/2015   HCT 25.5 (L) 03/27/2015   MCV 94.1 03/27/2015   PLT 245 03/27/2015     Chemistry      Component Value Date/Time   NA 140 03/13/2015 1041   K 4.2 03/13/2015 1041   CL 102 03/13/2015 1041   CO2 29 03/13/2015 1041   BUN 20 03/13/2015 1041   CREATININE 0.78 03/13/2015 1041      Component Value Date/Time   CALCIUM 10.2 03/13/2015 1041     EKG: normal EKG, normal sinus rhythm.  Anesthesia Physical Anesthesia Plan  ASA: II  Anesthesia Plan: MAC   Post-op Pain Management:    Induction: Intravenous  Airway Management Planned: Natural Airway and Nasal Cannula  Additional Equipment:   Intra-op Plan:   Post-operative Plan:   Informed Consent: I have reviewed the patients History and Physical, chart, labs and  discussed the procedure including the risks, benefits and alternatives for the proposed anesthesia with the patient or authorized representative who has indicated his/her understanding and acceptance.   Dental advisory given  Plan Discussed with: Anesthesiologist, CRNA and Surgeon  Anesthesia Plan Comments:         Anesthesia Quick Evaluation

## 2016-03-17 NOTE — Anesthesia Postprocedure Evaluation (Signed)
Anesthesia Post Note  Patient: Janet Giles  Procedure(s) Performed: Procedure(s) (LRB): ESOPHAGOGASTRODUODENOSCOPY (EGD) WITH PROPOFOL (N/A) COLONOSCOPY WITH PROPOFOL (N/A)  Patient location during evaluation: PACU Anesthesia Type: MAC Level of consciousness: awake and alert and oriented Pain management: pain level controlled Vital Signs Assessment: post-procedure vital signs reviewed and stable Respiratory status: spontaneous breathing, nonlabored ventilation and respiratory function stable Cardiovascular status: stable and blood pressure returned to baseline Postop Assessment: no signs of nausea or vomiting Anesthetic complications: no    Last Vitals:  Vitals:   03/17/16 1225 03/17/16 1327  BP: (!) 143/62 (!) 126/55  Pulse:  73  Resp: 18 18  Temp: 36.7 C 36.6 C    Last Pain:  Vitals:   03/17/16 1327  TempSrc: Oral                 Agam Davenport A.

## 2016-03-17 NOTE — Op Note (Signed)
[facility] Patient Name: Janet Giles Procedure Date: 03/17/2016 MRN: [id number] Attending MD: Garlan Fair , MD Date of Birth: January 13, 1941 CSN: [id number] Age: 75 Admit Type: Outpatient Procedure:                Colonoscopy Indications:              Screening patient at increased risk: Family history                            of 1st-degree relative with colorectal cancer at                            age 25 years (or older), Incidental - Chronic                            diarrhea Providers:                Garlan Fair, MD, Vista Lawman, RN, Cherylynn Ridges, Technician, Marla Roe, CRNA Referring MD:              Medicines:                Propofol per Anesthesia Complications:            No immediate complications. Estimated Blood Loss:     Estimated blood loss: none. Procedure:                Pre-Anesthesia Assessment:                           - Prior to the procedure, a History and Physical                            was performed, and patient medications and                            allergies were reviewed. The patient's tolerance of                            previous anesthesia was also reviewed. The risks                            and benefits of the procedure and the sedation                            options and risks were discussed with the patient.                            All questions were answered, and informed consent                            was obtained. Prior Anticoagulants: The patient has  taken aspirin, last dose was 1 day prior to                            procedure. ASA Grade Assessment: II - A patient                            with mild systemic disease. After reviewing the                            risks and benefits, the patient was deemed in                            satisfactory condition to undergo the procedure.                           After obtaining  informed consent, the colonoscope                            was passed under direct vision. Throughout the                            procedure, the patient's blood pressure, pulse, and                            oxygen saturations were monitored continuously. The                            was introduced through the anus and advanced to the                            the cecum, identified by appendiceal orifice and                            ileocecal valve. The colonoscopy was technically                            difficult and complex due to significant looping in                            the sigmoid colon. The patient tolerated the                            procedure well. The quality of the bowel                            preparation was good. The appendiceal orifice and                            the rectum were photographed. Scope In: 12:59:19 PM Scope Out: 1:19:41 PM Scope Withdrawal Time: 0 hours 8 minutes 13 seconds  Total Procedure Duration: 0 hours 20 minutes 22 seconds  Findings:      The perianal and digital rectal examinations were normal.      The entire  examined colon appeared normal. Left colonic diverticulosis       was present.      Biopsies for histology were taken with a cold forceps from the ascending       colon and descending colon for evaluation of microscopic colitis. Impression:               - The entire examined colon is normal.                           - Biopsies were taken with a cold forceps from the                            ascending colon and descending colon for evaluation                            of microscopic colitis. Moderate Sedation:      N/A- Per Anesthesia Care Recommendation:           - Patient has a contact number available for                            emergencies. The signs and symptoms of potential                            delayed complications were discussed with the                            patient. Return to normal  activities tomorrow.                            Written discharge instructions were provided to the                            patient.                           - Repeat colonoscopy is not recommended for                            screening purposes.                           - Resume previous diet.                           - Continue present medications. Procedure Code(s):        --- Professional ---                           [id number], Colonoscopy, flexible; with biopsy, single                            or multiple Diagnosis Code(s):        --- Professional ---                           Z80.0, Family history of malignant  neoplasm of                            digestive organs CPT copyright 2016 American Medical Association. All rights reserved. The codes documented in this report are preliminary and upon coder review may  be revised to meet current compliance requirements. Earle Gell, MD Garlan Fair, MD 03/17/2016 1:33:56 PM This report has been signed electronically. Number of Addenda: 0

## 2016-03-17 NOTE — H&P (Signed)
Problems: Nausea. Anorexia. Abdominal bloating discomfort. Chronic watery diarrhea. Intermittent small-volume hematochezia.  History: The patient is a 75 year old female born [birth year]. Her brother was diagnosed with colon cancer at age 79. She underwent a normal screening colonoscopy in May 2008. She underwent a normal but incomplete colonoscopy followed by air contrast barium enema in June 2013.  The patient has developed generalized abdominal bloating discomfort, nausea without vomiting, watery diarrhea, anorexia, and excessive flatus. Food does not taste good. Her energy level is low. She may have seen a small amount of blood in her bowel movements last week. She denies a history of diverticulitis. She denies weight loss. Her sister was recently diagnosed with H. pylori gastritis. She is having up to 5 loose bowel movements daily but reports no nocturnal diarrhea.  She is scheduled to undergo diagnostic esophagogastroduodenoscopy with screen for H. pylori gastritis followed by a screening colonoscopy screen for microscopic colitis.  Past medical history: Discoid lupus. Chronic obstructive pulmonary disease. Chronic low back pain. Hypercholesterolemia. Hypothyroidism. Tonsillectomy. Hysterectomy. Lumbar fusion surgery. Total right hip replacement surgery.  History: Brother was diagnosed with colon cancer at age 86.  Medication allergies: Bee sting anaphylaxis.  Exam: The patient is alert and lying comfortably on the endoscopy stretcher. Abdomen is soft and nontender to palpation. Lungs are clear to auscultation. Cardiac exam reveals a regular rhythm.  Plan: Proceed with diagnostic esophagogastroduodenoscopy, screen for H. pylori gastritis, screening colonoscopy, and screen for microscopic colitis.

## 2016-03-17 NOTE — Discharge Instructions (Signed)

## 2016-03-17 NOTE — Transfer of Care (Signed)
Immediate Anesthesia Transfer of Care Note  Patient: Janet Giles  Procedure(s) Performed: Procedure(s): ESOPHAGOGASTRODUODENOSCOPY (EGD) WITH PROPOFOL (N/A) COLONOSCOPY WITH PROPOFOL (N/A)  Patient Location: PACU  Anesthesia Type:MAC  Level of Consciousness:  sedated, patient cooperative and responds to stimulation  Airway & Oxygen Therapy:Patient Spontanous Breathing and Patient connected to face mask oxgen  Post-op Assessment:  Report given to PACU RN and Post -op Vital signs reviewed and stable  Post vital signs:  Reviewed and stable  Last Vitals:  Vitals:   03/17/16 1225  BP: (!) 143/62  Resp: 18  Temp: 123XX123 C    Complications: No apparent anesthesia complications

## 2016-03-17 NOTE — Op Note (Signed)
[facility] Patient Name: Janet Giles Procedure Date: 03/17/2016 MRN: [id number] Attending MD: Garlan Fair , MD Date of Birth: Jan 21, 1941 CSN: [id number] Age: 75 Admit Type: Outpatient Procedure:                Upper GI endoscopy Indications:              Generalized abdominal pain Providers:                Garlan Fair, MD, Vista Lawman, RN, Cherylynn Ridges, Technician, Marla Roe, CRNA Referring MD:              Medicines:                Propofol per Anesthesia Complications:            No immediate complications. Estimated Blood Loss:     Estimated blood loss: none. Procedure:                Pre-Anesthesia Assessment:                           - Prior to the procedure, a History and Physical                            was performed, and patient medications and                            allergies were reviewed. The patient's tolerance of                            previous anesthesia was also reviewed. The risks                            and benefits of the procedure and the sedation                            options and risks were discussed with the patient.                            All questions were answered, and informed consent                            was obtained. Prior Anticoagulants: The patient has                            taken aspirin, last dose was 1 day prior to                            procedure. ASA Grade Assessment: II - A patient                            with mild systemic disease. After reviewing the  risks and benefits, the patient was deemed in                            satisfactory condition to undergo the procedure.                           After obtaining informed consent, the endoscope was                            passed under direct vision. Throughout the                            procedure, the patient's blood pressure, pulse, and    oxygen saturations were monitored continuously. The                            Endoscope was introduced through the mouth, and                            advanced to the second part of duodenum. The upper                            GI endoscopy was accomplished without difficulty.                            The patient tolerated the procedure well. Scope In: Scope Out: Findings:      The Z-line was regular and was found 40 cm from the incisors.      The examined esophagus was normal.      The entire examined stomach was normal. Biopsies were taken with a cold       forceps for histology to look for H.pylori gastritis..      The examined duodenum was normal. A large diverticulum was present in       the proximal second portion duodenum. Impression:               - Z-line regular, 40 cm from the incisors.                           - Normal esophagus.                           - Normal stomach. Biopsied.                           - Normal examined duodenum. Moderate Sedation:      N/A- Per Anesthesia Care Recommendation:           - Patient has a contact number available for                            emergencies. The signs and symptoms of potential                            delayed complications were discussed with the  patient. Return to normal activities tomorrow.                            Written discharge instructions were provided to the                            patient.                           - Await pathology results.                           - Resume previous diet.                           - Continue present medications. Procedure Code(s):        --- Professional ---                           [phone], Esophagogastroduodenoscopy, flexible,                            transoral; with biopsy, single or multiple Diagnosis Code(s):        --- Professional ---                           R10.84, Generalized abdominal pain CPT copyright 2016 American  Medical Association. All rights reserved. The codes documented in this report are preliminary and upon coder review may  be revised to meet current compliance requirements. Earle Gell, MD Garlan Fair, MD 03/17/2016 1:28:54 PM This report has been signed electronically. Number of Addenda: 0

## 2016-03-18 ENCOUNTER — Encounter (HOSPITAL_COMMUNITY): Payer: Self-pay | Admitting: Gastroenterology

## 2016-03-19 ENCOUNTER — Ambulatory Visit (INDEPENDENT_AMBULATORY_CARE_PROVIDER_SITE_OTHER): Payer: Medicare Other | Admitting: Rheumatology

## 2016-03-19 ENCOUNTER — Encounter: Payer: Self-pay | Admitting: Rheumatology

## 2016-03-19 VITALS — BP 144/77 | HR 85 | Resp 13 | Ht 64.5 in | Wt 149.0 lb

## 2016-03-19 DIAGNOSIS — M359 Systemic involvement of connective tissue, unspecified: Secondary | ICD-10-CM | POA: Diagnosis not present

## 2016-03-19 DIAGNOSIS — M19042 Primary osteoarthritis, left hand: Secondary | ICD-10-CM

## 2016-03-19 DIAGNOSIS — M19041 Primary osteoarthritis, right hand: Secondary | ICD-10-CM | POA: Diagnosis not present

## 2016-03-19 DIAGNOSIS — M19079 Primary osteoarthritis, unspecified ankle and foot: Secondary | ICD-10-CM | POA: Diagnosis not present

## 2016-03-19 DIAGNOSIS — Z79899 Other long term (current) drug therapy: Secondary | ICD-10-CM | POA: Diagnosis not present

## 2016-03-19 LAB — CBC WITH DIFFERENTIAL/PLATELET
Basophils Absolute: 0 cells/uL (ref 0–200)
Basophils Relative: 0 %
Eosinophils Absolute: 228 cells/uL (ref 15–500)
Eosinophils Relative: 4 %
HCT: 32.3 % — ABNORMAL LOW (ref 35.0–45.0)
Hemoglobin: 10.5 g/dL — ABNORMAL LOW (ref 11.7–15.5)
Lymphocytes Relative: 31 %
Lymphs Abs: 1767 cells/uL (ref 850–3900)
MCH: 29.8 pg (ref 27.0–33.0)
MCHC: 32.5 g/dL (ref 32.0–36.0)
MCV: 91.8 fL (ref 80.0–100.0)
MPV: 8.4 fL (ref 7.5–12.5)
Monocytes Absolute: 456 cells/uL (ref 200–950)
Monocytes Relative: 8 %
Neutro Abs: 3249 cells/uL (ref 1500–7800)
Neutrophils Relative %: 57 %
Platelets: 321 10*3/uL (ref 140–400)
RBC: 3.52 MIL/uL — ABNORMAL LOW (ref 3.80–5.10)
RDW: 13.7 % (ref 11.0–15.0)
WBC: 5.7 10*3/uL (ref 3.8–10.8)

## 2016-03-19 LAB — COMPLETE METABOLIC PANEL WITH GFR
ALT: 13 U/L (ref 6–29)
AST: 19 U/L (ref 10–35)
Albumin: 4.2 g/dL (ref 3.6–5.1)
Alkaline Phosphatase: 43 U/L (ref 33–130)
BUN: 15 mg/dL (ref 7–25)
CO2: 27 mmol/L (ref 20–31)
Calcium: 9.5 mg/dL (ref 8.6–10.4)
Chloride: 103 mmol/L (ref 98–110)
Creat: 0.86 mg/dL (ref 0.60–0.93)
GFR, Est African American: 76 mL/min (ref >=60)
GFR, Est Non African American: 66 mL/min (ref >=60)
Glucose, Bld: 124 mg/dL — ABNORMAL HIGH (ref 65–99)
Potassium: 4.1 mmol/L (ref 3.5–5.3)
Sodium: 140 mmol/L (ref 135–146)
Total Bilirubin: 0.5 mg/dL (ref 0.2–1.2)
Total Protein: 6.6 g/dL (ref 6.1–8.1)

## 2016-03-19 MED ORDER — GABAPENTIN 300 MG PO CAPS: 300.0000 mg | ORAL_CAPSULE | Freq: Two times a day (BID) | ORAL | 0 refills | Status: DC

## 2016-03-19 MED ORDER — HYDROXYCHLOROQUINE SULFATE 200 MG PO TABS: 200.0000 mg | ORAL_TABLET | Freq: Two times a day (BID) | ORAL | 1 refills | Status: DC

## 2016-03-19 MED FILL — HYDROXYCHLOROQUINE 200 MG T: 200 | 90 days supply | Qty: 120 | Fill #0

## 2016-03-20 ENCOUNTER — Telehealth: Payer: Self-pay | Admitting: Radiology

## 2016-03-20 NOTE — Telephone Encounter (Signed)
I have called patient to advise labs are normal  Except mild anemia, improved compared to previous.

## 2016-03-20 NOTE — Progress Notes (Signed)
Labs from 03/19/2016 showsNonfasting glucose at 124. We can monitor  Mild anemia. She can discuss with her PCP and see if they want to address.  No change in treatment needed at this time.

## 2016-03-20 NOTE — Telephone Encounter (Signed)
-----   Message from [place], [place] sent at 03/20/2016 12:30 PM EST ----- Labs from 03/19/2016 showsNonfasting glucose at 124. We can monitor  Mild anemia. She can discuss with her PCP and see if they want to address.  No change in treatment needed at this time.

## 2016-03-26 DIAGNOSIS — M7061 Trochanteric bursitis, right hip: Secondary | ICD-10-CM | POA: Diagnosis not present

## 2016-03-26 DIAGNOSIS — M7062 Trochanteric bursitis, left hip: Secondary | ICD-10-CM | POA: Diagnosis not present

## 2016-04-15 ENCOUNTER — Other Ambulatory Visit: Payer: Self-pay | Admitting: Nurse Practitioner

## 2016-04-15 ENCOUNTER — Ambulatory Visit
Admission: RE | Admit: 2016-04-15 | Discharge: 2016-04-15 | Disposition: A | Discharge status: Home / Self Care | Payer: Medicare Other | Source: Ambulatory Visit | Attending: Nurse Practitioner | Admitting: Nurse Practitioner

## 2016-04-15 DIAGNOSIS — M545 Low back pain: Secondary | ICD-10-CM | POA: Diagnosis not present

## 2016-04-15 DIAGNOSIS — M47816 Spondylosis without myelopathy or radiculopathy, lumbar region: Secondary | ICD-10-CM

## 2016-04-23 ENCOUNTER — Other Ambulatory Visit: Payer: Self-pay | Admitting: Nurse Practitioner

## 2016-04-23 DIAGNOSIS — M47816 Spondylosis without myelopathy or radiculopathy, lumbar region: Secondary | ICD-10-CM

## 2016-04-23 DIAGNOSIS — M533 Sacrococcygeal disorders, not elsewhere classified: Secondary | ICD-10-CM

## 2016-04-28 ENCOUNTER — Other Ambulatory Visit: Payer: Self-pay | Admitting: Family Medicine

## 2016-04-28 DIAGNOSIS — Z1231 Encounter for screening mammogram for malignant neoplasm of breast: Secondary | ICD-10-CM

## 2016-05-01 ENCOUNTER — Ambulatory Visit
Admission: RE | Admit: 2016-05-01 | Discharge: 2016-05-01 | Disposition: A | Discharge status: Home / Self Care | Payer: Medicare Other | Source: Ambulatory Visit | Attending: Nurse Practitioner | Admitting: Nurse Practitioner

## 2016-05-01 DIAGNOSIS — M5126 Other intervertebral disc displacement, lumbar region: Secondary | ICD-10-CM | POA: Diagnosis not present

## 2016-05-01 DIAGNOSIS — M533 Sacrococcygeal disorders, not elsewhere classified: Secondary | ICD-10-CM

## 2016-05-01 DIAGNOSIS — M47816 Spondylosis without myelopathy or radiculopathy, lumbar region: Secondary | ICD-10-CM

## 2016-05-18 ENCOUNTER — Other Ambulatory Visit: Payer: Self-pay | Admitting: Nurse Practitioner

## 2016-05-18 DIAGNOSIS — M533 Sacrococcygeal disorders, not elsewhere classified: Secondary | ICD-10-CM

## 2016-05-18 DIAGNOSIS — M47816 Spondylosis without myelopathy or radiculopathy, lumbar region: Secondary | ICD-10-CM | POA: Diagnosis not present

## 2016-05-22 ENCOUNTER — Ambulatory Visit
Admission: RE | Admit: 2016-05-22 | Discharge: 2016-05-22 | Disposition: A | Discharge status: Home / Self Care | Payer: Medicare Other | Source: Ambulatory Visit | Attending: Nurse Practitioner | Admitting: Nurse Practitioner

## 2016-05-22 DIAGNOSIS — R102 Pelvic and perineal pain: Secondary | ICD-10-CM | POA: Diagnosis not present

## 2016-05-22 DIAGNOSIS — M533 Sacrococcygeal disorders, not elsewhere classified: Secondary | ICD-10-CM

## 2016-05-22 MED ORDER — IOPAMIDOL (ISOVUE-M 200) INJECTION 41%
1.0000 mL | Freq: Once | INTRAMUSCULAR | Status: AC
  Administered 2016-05-22: 1 mL via INTRA_ARTICULAR

## 2016-05-22 MED ORDER — METHYLPREDNISOLONE ACETATE 40 MG/ML INJ SUSP (RADIOLOG
120.0000 mg | Freq: Once | INTRAMUSCULAR | Status: AC
  Administered 2016-05-22: 120 mg via INTRA_ARTICULAR

## 2016-05-22 NOTE — Discharge Instructions (Signed)

## 2016-06-09 ENCOUNTER — Ambulatory Visit
Admission: RE | Admit: 2016-06-09 | Discharge: 2016-06-09 | Disposition: A | Discharge status: Home / Self Care | Payer: Medicare Other | Source: Ambulatory Visit | Attending: Family Medicine | Admitting: Family Medicine

## 2016-06-09 DIAGNOSIS — Z1231 Encounter for screening mammogram for malignant neoplasm of breast: Secondary | ICD-10-CM

## 2016-06-19 DIAGNOSIS — Z79899 Other long term (current) drug therapy: Secondary | ICD-10-CM | POA: Diagnosis not present

## 2016-07-01 DIAGNOSIS — M533 Sacrococcygeal disorders, not elsewhere classified: Secondary | ICD-10-CM | POA: Diagnosis not present

## 2016-07-01 DIAGNOSIS — M47816 Spondylosis without myelopathy or radiculopathy, lumbar region: Secondary | ICD-10-CM | POA: Diagnosis not present

## 2016-07-20 DIAGNOSIS — M533 Sacrococcygeal disorders, not elsewhere classified: Secondary | ICD-10-CM | POA: Diagnosis not present

## 2016-08-06 DIAGNOSIS — M19072 Primary osteoarthritis, left ankle and foot: Secondary | ICD-10-CM | POA: Insufficient documentation

## 2016-08-06 DIAGNOSIS — M19041 Primary osteoarthritis, right hand: Secondary | ICD-10-CM | POA: Insufficient documentation

## 2016-08-06 DIAGNOSIS — M19042 Primary osteoarthritis, left hand: Secondary | ICD-10-CM

## 2016-08-06 DIAGNOSIS — I7300 Raynaud's syndrome without gangrene: Secondary | ICD-10-CM

## 2016-08-06 DIAGNOSIS — R768 Other specified abnormal immunological findings in serum: Secondary | ICD-10-CM

## 2016-08-06 DIAGNOSIS — M19071 Primary osteoarthritis, right ankle and foot: Secondary | ICD-10-CM

## 2016-08-06 DIAGNOSIS — Z79899 Other long term (current) drug therapy: Secondary | ICD-10-CM

## 2016-08-06 HISTORY — DX: Primary osteoarthritis, right hand: M19.042

## 2016-08-06 HISTORY — DX: Other specified abnormal immunological findings in serum: R76.8

## 2016-08-06 HISTORY — DX: Primary osteoarthritis, right hand: M19.041

## 2016-08-06 HISTORY — DX: Primary osteoarthritis, right ankle and foot: M19.071

## 2016-08-06 HISTORY — DX: Raynaud's syndrome without gangrene: I73.00

## 2016-08-06 HISTORY — DX: Other long term (current) drug therapy: Z79.899

## 2016-08-06 HISTORY — DX: Primary osteoarthritis, left ankle and foot: M19.072

## 2016-08-06 NOTE — Progress Notes (Signed)
Office Visit Note  Patient: Janet Giles             Date of Birth: 1941/01/24           MRN: [id number]             PCP: Donnie Coffin, MD Referring: Aurea Graff.Marlou Sa, MD Visit Date: 08/17/2016 Occupation: '@GUAROCC' @    Subjective:  Lower back pain.   History of Present Illness: Janet Giles is a 76 y.o. female with the history of autoimmune disease and osteoarthritis. She states she's been having lower back pain for almost a year now. She states she had routine endoscopy and colonoscopy which was normal. She also went to see her neurosurgeon who did MRI of her lumbar spine and then referred her to Dr. Skip Mayer will for [facility] joint injection. She states those injection lasted only for short while. The pain has recurred now. She states the pain is constant and she also has nocturnal pain. She has some discomfort in her left thumb. She denies any joint swelling. She does have neuropathy in her feet which continues to hurt. She has not noticed any new skin rash. Raynauds is active only off and on with the weather change. She's been experiencing some shortness of breath on exertion. She states at her visit for her physical she was given an inhaler.  Activities of Daily Living:  Patient reports morning stiffness for 10 minutes.   Patient Reports nocturnal pain.  Difficulty dressing/grooming: Denies Difficulty climbing stairs: Denies Difficulty getting out of chair: Reports Difficulty using hands for taps, buttons, cutlery, and/or writing: Denies   Review of Systems  Constitutional: Positive for fatigue. Negative for night sweats, weight gain, weight loss and weakness.  HENT: Positive for mouth dryness. Negative for mouth sores, trouble swallowing, trouble swallowing and nose dryness.   Eyes: Positive for dryness. Negative for pain, redness and visual disturbance.  Respiratory: Positive for shortness of breath. Negative for cough and difficulty breathing.   Cardiovascular: Negative for  chest pain, palpitations, hypertension, irregular heartbeat and swelling in legs/feet.  Gastrointestinal: Negative for blood in stool, constipation and diarrhea.  Endocrine: Negative for increased urination.  Genitourinary: Negative for vaginal dryness.  Musculoskeletal: Positive for arthralgias, joint pain and morning stiffness. Negative for joint swelling, myalgias, muscle weakness, muscle tenderness and myalgias.  Skin: Negative for color change, rash, hair loss, skin tightness, ulcers and sensitivity to sunlight.  Allergic/Immunologic: Negative for susceptible to infections.  Neurological: Negative for dizziness, memory loss and night sweats.  Hematological: Negative for swollen glands.  Psychiatric/Behavioral: Positive for sleep disturbance. Negative for depressed mood. The patient is not nervous/anxious.     PMFS History:  Patient Active Problem List   Diagnosis Date Noted  . 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 Idiopathic peripheral neuropathy 08/13/2016  . 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 Former smoker 08/13/2016  . ANA positive 08/06/2016  . Raynaud's disease without gangrene 08/06/2016  . Primary osteoarthritis of both hands 08/06/2016  . Primary osteoarthritis of both feet 08/06/2016  . High risk medication use 08/06/2016  . Primary osteoarthritis of right hip 03/24/2015  . 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 AAA (abdominal aortic aneurysm) ([place])   . Complication of anesthesia    trouble breathing after back surgery  . Degenerative joint disease of spine    buttocks pain -remains a problem  . Family history of adverse reaction to anesthesia  one sister nasea and vomiting  . GERD (gastroesophageal reflux disease)    occ  . Hyperlipidemia   . Hypothyroidism   . Lupus    Dr. Aura Fey  . Neuromuscular disorder ([facility])    neuropathy in feet  . PONV (postoperative nausea and vomiting)   .  Shortness of breath dyspnea    uses inhaler occ    Family History  Problem Relation Age of Onset  . Diabetes Mother   . Heart disease Mother   . Heart attack Mother   . Bleeding Disorder Father   . Heart disease Sister   . Heart attack Sister   . Cancer Sister   . Deep vein thrombosis Sister   . Diabetes Sister   . Hyperlipidemia Sister   . Hypertension Sister   . Varicose Veins Sister   . AAA (abdominal aortic aneurysm) Sister   . Bleeding Disorder Sister   . Heart disease Brother   . Heart attack Brother   . Hypertension Brother    Past Surgical History:  Procedure Laterality Date  . ABDOMINAL HYSTERECTOMY    . BACK SURGERY  2   L4-L5- fusion  . COLONOSCOPY WITH PROPOFOL N/A 03/17/2016   Procedure: COLONOSCOPY WITH PROPOFOL;  Surgeon: Garlan Fair, MD;  Location: [facility];  Service: Endoscopy;  Laterality: N/A;  . ESOPHAGOGASTRODUODENOSCOPY (EGD) WITH PROPOFOL N/A 03/17/2016   Procedure: ESOPHAGOGASTRODUODENOSCOPY (EGD) WITH PROPOFOL;  Surgeon: Garlan Fair, MD;  Location: [facility];  Service: Endoscopy;  Laterality: N/A;  . JOINT REPLACEMENT    . TONSILLECTOMY AND ADENOIDECTOMY    . TOTAL HIP ARTHROPLASTY Right 03/25/2015   Procedure: TOTAL HIP ARTHROPLASTY ANTERIOR APPROACH;  Surgeon: Frederik Pear, MD;  Location: [facility];  Service: Orthopedics;  Laterality: Right;   Social History   Social History Narrative  . No narrative on file     Objective: Vital Signs: BP 122/68   Pulse 74   Resp 14   Wt 145 lb (65.8 kg)   BMI 24.50 kg/m    Physical Exam  Constitutional: She is oriented to person, place, and time. She appears well-developed and well-nourished.  HENT:  Head: Normocephalic and atraumatic.  Eyes: Conjunctivae and EOM are normal.  Neck: Normal range of motion.  Cardiovascular: Normal rate, regular rhythm, normal heart sounds and intact distal pulses.   Pulmonary/Chest: Effort normal and breath sounds normal.  Abdominal: Soft. Bowel sounds  are normal.  Lymphadenopathy:    She has no cervical adenopathy.  Neurological: She is alert and oriented to person, place, and time.  Skin: Skin is warm and dry. Capillary refill takes less than 2 seconds.  Psychiatric: She has a normal mood and affect. Her behavior is normal.  Nursing note and vitals reviewed.    Musculoskeletal Exam: C-spine and thoracic spine good range of motion she has limited range of motion of her lumbar spine. She has tenderness on palpation over right SI joint and right piriformis area. Shoulder joints, elbow joints, wrist joints, MCPs PIPs DIPs with good range of motion. She is thickening of bilateral CMC PIP/DIP and DIP joints. Hip joints knee joints ankles MTPs PIPs DIPs with good range of motion with no synovitis. She has discomfort in her left CMC joint.  CDAI Exam: No CDAI exam completed.    Investigation: Findings:  June 2017:  CK was 94.  Comprehensive metabolic panel showed calcium of 10.5, TSH was normal, vitamin D 43.  CBC was normal.  UA negative.  Sed rate was 34 which  was elevated.  ANA was 1:160 nucleolar pattern.  Rheumatoid factor was negative.  CCP was negative.  Uric acid 4.0.  HLA-B27 was negative, and ACE level was negative.   Labs from November 28, 2015, show double-stranded DNA to be 5. The rest of the autoimmune workup is negative.  C3 and C4 normal.  Lupus anticoagulant negative.  Beta-2 glycoprotein negative.  Anticardiolipin negative.  Cryoglobulin negative.  G6PD is normal. .   11/28/2015 X-rays of bilateral hands, 2 views, show bilateral CMC, PIP, and DIP narrowing, right 1st DIP subluxation consistent with osteoarthritis.    Bilateral feet x-rays showed bilateral PIP and DIP narrowing without any erosive changes. 03/19/2016 CBC hemoglobin 10.5, CMP normal          Imaging: No results found.  Speciality Comments: No specialty comments available.    Procedures:  Small Joint Inj Date/Time: 08/17/2016 11:01 AM Performed by:  Bo Merino Authorized by: Bo Merino   Consent Given by:  Patient Site marked: the procedure site was marked   Timeout: prior to procedure the correct patient, procedure, and site was verified   Indications:  Pain Location:  Thumb Site:  L thumb CMC Prep: patient was prepped and draped in usual sterile fashion   Needle Size:  27 G Spinal Needle: No   Approach:  Radial Ultrasound Guided: Yes   Fluoroscopic Guidance: No   Medications:  0.5 mL lidocaine 1 %; 10 mg triamcinolone acetonide 40 MG/ML Aspiration Attempted: Yes   Aspirate amount (mL):  0 Patient tolerance:  Patient tolerated the procedure well with no immediate complications   Allergies: Wasp venom   Assessment / Plan:     Visit Diagnoses: ANA positive - ANA 1:160 nucleolar, DS 5, Raynauds, 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 Ubaldo Glassing per patient -as regards to autoimmune disease she is been doing quite well. She has not had any recent rash. Her Raynauds is not very active. She does not have any other features of autoimmune disease. She believes that Plaquenil is causing some of the side effects and she would like to come off the Plaquenil. I do not see any reason that she can come off Plaquenil. We will discontinue Plaquenil and also. Plan: Sedimentation rate, Anti-DNA antibody, double-stranded, C3 and C4  Raynaud's disease without gangrene: Currently not very active  High risk medication use - Plaquenil 200 mg by mouth twice a day Monday to Friday - Plan: CBC with Differential/Platelet, COMPLETE METABOLIC PANEL WITH GFR  Primary osteoarthritis of both hands: She is been having pain and discomfort in her left [facility] joint which is been extremely painful for her. After informed consent was obtained and side effects were discussed and ultrasound guided left CMC injection was performed as described above.  History of total hip replacement, right: Doing fairly well  Primary osteoarthritis of both feet:  Chronic pain  Idiopathic peripheral neuropathy: She continues to have some discomfort from neuropathy.  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 Carloyn Manner: She's been having ongoing pain and discomfort in her lower back. She had inadequate response to left SI joint injection. She appears to have some symptoms of her left piriformis muscle spasm. I offered physical therapy but she would like to hold off for right now. She states she tried it in the past and did not have good results.  Former smoker   She also has Aortic ectasia, abdominal   Question: Any benefit from Plaquenil?  Orders: Orders Placed This Encounter  Procedures  .  Small Joint Injection/Arthrocentesis  . CBC with Differential/Platelet  . COMPLETE METABOLIC PANEL WITH GFR  . Sedimentation rate  . Anti-DNA antibody, double-stranded  . C3 and C4   No orders of the defined types were placed in this encounter.   Face-to-face time spent with patient was 30 minutes. 50% of time was spent in counseling and coordination of care.  Follow-Up Instructions: Return in about 6 months (around 02/16/2017) for Osteoarthritis, Raynauds.   Bo Merino, MD  Note - This record has been created using Editor, commissioning.  Chart creation errors have been sought, but may not always  have been located. Such creation errors do not reflect on  the standard of medical care.

## 2016-08-11 DIAGNOSIS — M533 Sacrococcygeal disorders, not elsewhere classified: Secondary | ICD-10-CM | POA: Diagnosis not present

## 2016-08-12 ENCOUNTER — Other Ambulatory Visit: Payer: Self-pay | Admitting: Nurse Practitioner

## 2016-08-12 DIAGNOSIS — M533 Sacrococcygeal disorders, not elsewhere classified: Secondary | ICD-10-CM

## 2016-08-12 DIAGNOSIS — M47816 Spondylosis without myelopathy or radiculopathy, lumbar region: Secondary | ICD-10-CM

## 2016-08-13 DIAGNOSIS — J45909 Unspecified asthma, uncomplicated: Secondary | ICD-10-CM | POA: Diagnosis not present

## 2016-08-13 DIAGNOSIS — R300 Dysuria: Secondary | ICD-10-CM | POA: Diagnosis not present

## 2016-08-13 DIAGNOSIS — E7800 Pure hypercholesterolemia, unspecified: Secondary | ICD-10-CM | POA: Diagnosis not present

## 2016-08-13 DIAGNOSIS — G609 Hereditary and idiopathic neuropathy, unspecified: Secondary | ICD-10-CM

## 2016-08-13 DIAGNOSIS — Z96641 Presence of right artificial hip joint: Secondary | ICD-10-CM | POA: Insufficient documentation

## 2016-08-13 DIAGNOSIS — Z87891 Personal history of nicotine dependence: Secondary | ICD-10-CM

## 2016-08-13 DIAGNOSIS — G629 Polyneuropathy, unspecified: Secondary | ICD-10-CM | POA: Diagnosis not present

## 2016-08-13 DIAGNOSIS — E039 Hypothyroidism, unspecified: Secondary | ICD-10-CM | POA: Diagnosis not present

## 2016-08-13 DIAGNOSIS — N3000 Acute cystitis without hematuria: Secondary | ICD-10-CM | POA: Diagnosis not present

## 2016-08-13 DIAGNOSIS — H6120 Impacted cerumen, unspecified ear: Secondary | ICD-10-CM | POA: Diagnosis not present

## 2016-08-13 DIAGNOSIS — M47816 Spondylosis without myelopathy or radiculopathy, lumbar region: Secondary | ICD-10-CM

## 2016-08-13 DIAGNOSIS — M8580 Other specified disorders of bone density and structure, unspecified site: Secondary | ICD-10-CM | POA: Diagnosis not present

## 2016-08-13 DIAGNOSIS — Z23 Encounter for immunization: Secondary | ICD-10-CM | POA: Diagnosis not present

## 2016-08-13 HISTORY — DX: Hereditary and idiopathic neuropathy, unspecified: G60.9

## 2016-08-13 HISTORY — DX: Presence of right artificial hip joint: Z96.641

## 2016-08-13 HISTORY — DX: Spondylosis without myelopathy or radiculopathy, lumbar region: M47.816

## 2016-08-13 HISTORY — DX: Personal history of nicotine dependence: Z87.891

## 2016-08-17 ENCOUNTER — Ambulatory Visit (INDEPENDENT_AMBULATORY_CARE_PROVIDER_SITE_OTHER): Payer: Medicare Other | Admitting: Rheumatology

## 2016-08-17 ENCOUNTER — Encounter: Payer: Self-pay | Admitting: Rheumatology

## 2016-08-17 VITALS — BP 122/68 | HR 74 | Resp 14 | Wt 145.0 lb

## 2016-08-17 DIAGNOSIS — Z96641 Presence of right artificial hip joint: Secondary | ICD-10-CM

## 2016-08-17 DIAGNOSIS — Z79899 Other long term (current) drug therapy: Secondary | ICD-10-CM | POA: Diagnosis not present

## 2016-08-17 DIAGNOSIS — I7300 Raynaud's syndrome without gangrene: Secondary | ICD-10-CM | POA: Diagnosis not present

## 2016-08-17 DIAGNOSIS — M19042 Primary osteoarthritis, left hand: Secondary | ICD-10-CM

## 2016-08-17 DIAGNOSIS — Z87891 Personal history of nicotine dependence: Secondary | ICD-10-CM

## 2016-08-17 DIAGNOSIS — M19041 Primary osteoarthritis, right hand: Secondary | ICD-10-CM

## 2016-08-17 DIAGNOSIS — G609 Hereditary and idiopathic neuropathy, unspecified: Secondary | ICD-10-CM | POA: Diagnosis not present

## 2016-08-17 DIAGNOSIS — M19071 Primary osteoarthritis, right ankle and foot: Secondary | ICD-10-CM | POA: Diagnosis not present

## 2016-08-17 DIAGNOSIS — M47816 Spondylosis without myelopathy or radiculopathy, lumbar region: Secondary | ICD-10-CM | POA: Diagnosis not present

## 2016-08-17 DIAGNOSIS — R768 Other specified abnormal immunological findings in serum: Secondary | ICD-10-CM | POA: Diagnosis not present

## 2016-08-17 DIAGNOSIS — M19072 Primary osteoarthritis, left ankle and foot: Secondary | ICD-10-CM | POA: Diagnosis not present

## 2016-08-17 LAB — CBC WITH DIFFERENTIAL/PLATELET
Basophils Absolute: 0 cells/uL (ref 0–200)
Basophils Relative: 0 %
Eosinophils Absolute: 207 cells/uL (ref 15–500)
Eosinophils Relative: 3 %
HCT: 34.1 % — ABNORMAL LOW (ref 35.0–45.0)
Hemoglobin: 11.1 g/dL — ABNORMAL LOW (ref 11.7–15.5)
Lymphocytes Relative: 28 %
Lymphs Abs: 1932 cells/uL (ref 850–3900)
MCH: 29.9 pg (ref 27.0–33.0)
MCHC: 32.6 g/dL (ref 32.0–36.0)
MCV: 91.9 fL (ref 80.0–100.0)
MPV: 8.5 fL (ref 7.5–12.5)
Monocytes Absolute: 552 cells/uL (ref 200–950)
Monocytes Relative: 8 %
Neutro Abs: 4209 cells/uL (ref 1500–7800)
Neutrophils Relative %: 61 %
Platelets: 379 10*3/uL (ref 140–400)
RBC: 3.71 MIL/uL — ABNORMAL LOW (ref 3.80–5.10)
RDW: 13.7 % (ref 11.0–15.0)
WBC: 6.9 10*3/uL (ref 3.8–10.8)

## 2016-08-17 LAB — COMPLETE METABOLIC PANEL WITH GFR
ALT: 12 U/L (ref 6–29)
AST: 17 U/L (ref 10–35)
Albumin: 4.3 g/dL (ref 3.6–5.1)
Alkaline Phosphatase: 47 U/L (ref 33–130)
BUN: 20 mg/dL (ref 7–25)
CO2: 27 mmol/L (ref 20–31)
Calcium: 10.4 mg/dL (ref 8.6–10.4)
Chloride: 103 mmol/L (ref 98–110)
Creat: 1 mg/dL — ABNORMAL HIGH (ref 0.60–0.93)
GFR, Est African American: 63 mL/min (ref >=60)
GFR, Est Non African American: 55 mL/min — ABNORMAL LOW (ref >=60)
Glucose, Bld: 86 mg/dL (ref 65–99)
Potassium: 4.8 mmol/L (ref 3.5–5.3)
Sodium: 138 mmol/L (ref 135–146)
Total Bilirubin: 0.5 mg/dL (ref 0.2–1.2)
Total Protein: 6.9 g/dL (ref 6.1–8.1)

## 2016-08-17 MED ORDER — TRIAMCINOLONE ACETONIDE 40 MG/ML IJ SUSP
10.0000 mg | INTRAMUSCULAR | Status: AC | PRN
  Administered 2016-08-17: 10 mg via INTRA_ARTICULAR

## 2016-08-17 MED ORDER — LIDOCAINE HCL 1 % IJ SOLN
0.5000 mL | INTRAMUSCULAR | Status: AC | PRN
  Administered 2016-08-17: .5 mL

## 2016-08-18 LAB — C3 AND C4
C3 Complement: 170 mg/dL (ref 83–193)
C4 Complement: 18 mg/dL (ref 15–57)

## 2016-08-18 LAB — SEDIMENTATION RATE: Sed Rate: 32 mm/hr — ABNORMAL HIGH (ref 0–30)

## 2016-08-18 LAB — ANTI-DNA ANTIBODY, DOUBLE-STRANDED: ds DNA Ab: <1 IU/mL

## 2016-08-19 NOTE — Progress Notes (Signed)
stable °

## 2016-08-25 ENCOUNTER — Ambulatory Visit
Admission: RE | Admit: 2016-08-25 | Discharge: 2016-08-25 | Disposition: A | Discharge status: Home / Self Care | Payer: Medicare Other | Source: Ambulatory Visit | Attending: Nurse Practitioner | Admitting: Nurse Practitioner

## 2016-08-25 ENCOUNTER — Other Ambulatory Visit: Payer: Medicare Other

## 2016-08-25 DIAGNOSIS — M533 Sacrococcygeal disorders, not elsewhere classified: Secondary | ICD-10-CM

## 2016-08-25 DIAGNOSIS — M1612 Unilateral primary osteoarthritis, left hip: Secondary | ICD-10-CM | POA: Diagnosis not present

## 2016-09-08 DIAGNOSIS — M47817 Spondylosis without myelopathy or radiculopathy, lumbosacral region: Secondary | ICD-10-CM | POA: Diagnosis not present

## 2016-09-08 DIAGNOSIS — M461 Sacroiliitis, not elsewhere classified: Secondary | ICD-10-CM | POA: Diagnosis not present

## 2016-09-08 DIAGNOSIS — G894 Chronic pain syndrome: Secondary | ICD-10-CM | POA: Diagnosis not present

## 2016-09-18 DIAGNOSIS — M47816 Spondylosis without myelopathy or radiculopathy, lumbar region: Secondary | ICD-10-CM | POA: Diagnosis not present

## 2016-09-24 DIAGNOSIS — M7061 Trochanteric bursitis, right hip: Secondary | ICD-10-CM | POA: Diagnosis not present

## 2016-09-29 DIAGNOSIS — M7061 Trochanteric bursitis, right hip: Secondary | ICD-10-CM | POA: Diagnosis not present

## 2016-09-29 DIAGNOSIS — I788 Other diseases of capillaries: Secondary | ICD-10-CM | POA: Diagnosis not present

## 2016-09-29 DIAGNOSIS — L814 Other melanin hyperpigmentation: Secondary | ICD-10-CM | POA: Diagnosis not present

## 2016-09-29 DIAGNOSIS — D2271 Melanocytic nevi of right lower limb, including hip: Secondary | ICD-10-CM | POA: Diagnosis not present

## 2016-09-29 DIAGNOSIS — L570 Actinic keratosis: Secondary | ICD-10-CM | POA: Diagnosis not present

## 2016-09-29 DIAGNOSIS — L820 Inflamed seborrheic keratosis: Secondary | ICD-10-CM | POA: Diagnosis not present

## 2016-09-29 DIAGNOSIS — D1801 Hemangioma of skin and subcutaneous tissue: Secondary | ICD-10-CM | POA: Diagnosis not present

## 2016-09-29 DIAGNOSIS — D692 Other nonthrombocytopenic purpura: Secondary | ICD-10-CM | POA: Diagnosis not present

## 2016-09-29 DIAGNOSIS — L821 Other seborrheic keratosis: Secondary | ICD-10-CM | POA: Diagnosis not present

## 2016-09-29 DIAGNOSIS — L720 Epidermal cyst: Secondary | ICD-10-CM | POA: Diagnosis not present

## 2016-09-29 DIAGNOSIS — M25551 Pain in right hip: Secondary | ICD-10-CM | POA: Diagnosis not present

## 2016-09-29 DIAGNOSIS — L578 Other skin changes due to chronic exposure to nonionizing radiation: Secondary | ICD-10-CM | POA: Diagnosis not present

## 2016-10-02 DIAGNOSIS — M7061 Trochanteric bursitis, right hip: Secondary | ICD-10-CM | POA: Diagnosis not present

## 2016-10-02 DIAGNOSIS — M25551 Pain in right hip: Secondary | ICD-10-CM | POA: Diagnosis not present

## 2016-10-07 DIAGNOSIS — M25551 Pain in right hip: Secondary | ICD-10-CM | POA: Diagnosis not present

## 2016-10-07 DIAGNOSIS — M7061 Trochanteric bursitis, right hip: Secondary | ICD-10-CM | POA: Diagnosis not present

## 2016-10-12 DIAGNOSIS — M7061 Trochanteric bursitis, right hip: Secondary | ICD-10-CM | POA: Diagnosis not present

## 2016-10-12 DIAGNOSIS — M25561 Pain in right knee: Secondary | ICD-10-CM | POA: Diagnosis not present

## 2016-10-14 DIAGNOSIS — M7061 Trochanteric bursitis, right hip: Secondary | ICD-10-CM | POA: Diagnosis not present

## 2016-10-14 DIAGNOSIS — M25551 Pain in right hip: Secondary | ICD-10-CM | POA: Diagnosis not present

## 2016-10-19 DIAGNOSIS — M25551 Pain in right hip: Secondary | ICD-10-CM | POA: Diagnosis not present

## 2016-10-19 DIAGNOSIS — M7061 Trochanteric bursitis, right hip: Secondary | ICD-10-CM | POA: Diagnosis not present

## 2016-10-20 DIAGNOSIS — R3915 Urgency of urination: Secondary | ICD-10-CM | POA: Diagnosis not present

## 2016-10-27 DIAGNOSIS — M25551 Pain in right hip: Secondary | ICD-10-CM | POA: Diagnosis not present

## 2016-10-27 DIAGNOSIS — M7061 Trochanteric bursitis, right hip: Secondary | ICD-10-CM | POA: Diagnosis not present

## 2016-11-04 DIAGNOSIS — M25551 Pain in right hip: Secondary | ICD-10-CM | POA: Diagnosis not present

## 2016-11-04 DIAGNOSIS — M7061 Trochanteric bursitis, right hip: Secondary | ICD-10-CM | POA: Diagnosis not present

## 2016-11-06 DIAGNOSIS — M7061 Trochanteric bursitis, right hip: Secondary | ICD-10-CM | POA: Diagnosis not present

## 2016-11-06 DIAGNOSIS — M25551 Pain in right hip: Secondary | ICD-10-CM | POA: Diagnosis not present

## 2016-11-09 DIAGNOSIS — M25551 Pain in right hip: Secondary | ICD-10-CM | POA: Diagnosis not present

## 2016-11-09 DIAGNOSIS — M7061 Trochanteric bursitis, right hip: Secondary | ICD-10-CM | POA: Diagnosis not present

## 2016-11-13 DIAGNOSIS — L820 Inflamed seborrheic keratosis: Secondary | ICD-10-CM | POA: Diagnosis not present

## 2016-11-13 DIAGNOSIS — M7061 Trochanteric bursitis, right hip: Secondary | ICD-10-CM | POA: Diagnosis not present

## 2016-11-13 DIAGNOSIS — M25551 Pain in right hip: Secondary | ICD-10-CM | POA: Diagnosis not present

## 2016-11-13 DIAGNOSIS — L821 Other seborrheic keratosis: Secondary | ICD-10-CM | POA: Diagnosis not present

## 2016-11-19 DIAGNOSIS — M7061 Trochanteric bursitis, right hip: Secondary | ICD-10-CM | POA: Diagnosis not present

## 2016-11-19 DIAGNOSIS — M25551 Pain in right hip: Secondary | ICD-10-CM | POA: Diagnosis not present

## 2016-11-23 DIAGNOSIS — M7061 Trochanteric bursitis, right hip: Secondary | ICD-10-CM | POA: Diagnosis not present

## 2016-11-23 DIAGNOSIS — M25551 Pain in right hip: Secondary | ICD-10-CM | POA: Diagnosis not present

## 2016-11-26 DIAGNOSIS — M25551 Pain in right hip: Secondary | ICD-10-CM | POA: Diagnosis not present

## 2016-11-26 DIAGNOSIS — M7061 Trochanteric bursitis, right hip: Secondary | ICD-10-CM | POA: Diagnosis not present

## 2016-12-03 DIAGNOSIS — M25551 Pain in right hip: Secondary | ICD-10-CM | POA: Diagnosis not present

## 2016-12-03 DIAGNOSIS — M7061 Trochanteric bursitis, right hip: Secondary | ICD-10-CM | POA: Diagnosis not present

## 2016-12-17 DIAGNOSIS — H2513 Age-related nuclear cataract, bilateral: Secondary | ICD-10-CM | POA: Diagnosis not present

## 2016-12-18 DIAGNOSIS — M47816 Spondylosis without myelopathy or radiculopathy, lumbar region: Secondary | ICD-10-CM | POA: Diagnosis not present

## 2017-02-08 NOTE — Progress Notes (Signed)
Office Visit Note  Patient: Janet Giles             Date of Birth: 1940/11/02           MRN: [id number]             PCP: Alroy Dust, L.Marlou Sa, MD Referring: Alroy Dust, Carlean Jews.Marlou Sa, MD Visit Date: 02/16/2017 Occupation: @GUAROCC @    Subjective:  Trochanteric bursitis right.   History of Present Illness: Janet Giles is a 76 y.o. female with history of autoimmune disease, osteoarthritis and disc disease. She states she's been having increased fatigue lately. She continues to have discomfort in her feet due to peripheral neuropathy. She states she developed trochanteric bursitis on the right side which was causing a lot of discomfort. She had been seeing Dr. Mayer Camel. She states she initially tried physical therapy without much results. Dr. Mayer Camel also gave her cortisone shot which helped . She still have some discomfort in the trochanteric area. She reports some stiffness in her hands due to underlying osteoarthritis. Her right total hip replacement is doing okay. She has been off hydroxychloroquine since April 2018 as she assumed it was causing lower back pain. She has right third trigger finger.  Activities of Daily Living:  Patient reports morning stiffness for 2 hours.   Patient Reports nocturnal pain.  Difficulty dressing/grooming: Denies Difficulty climbing stairs: Reports Difficulty getting out of chair: Reports Difficulty using hands for taps, buttons, cutlery, and/or writing: Denies   Review of Systems  Constitutional: Positive for fatigue. Negative for night sweats, weight gain, weight loss and weakness.  HENT: Positive for mouth dryness. Negative for mouth sores, trouble swallowing, trouble swallowing and nose dryness.   Eyes: Positive for dryness. Negative for pain, redness and visual disturbance.  Respiratory: Positive for shortness of breath. Negative for cough and difficulty breathing.   Cardiovascular: Negative.  Negative for chest pain, palpitations, hypertension,  irregular heartbeat and swelling in legs/feet.  Gastrointestinal: Negative.  Negative for blood in stool, constipation and diarrhea.  Endocrine: Negative.  Negative for increased urination.  Genitourinary: Negative for nocturia and vaginal dryness.  Musculoskeletal: Positive for arthralgias, joint pain, myalgias, morning stiffness and myalgias. Negative for joint swelling, muscle weakness and muscle tenderness.  Skin: Negative.  Negative for color change, rash, hair loss, skin tightness, ulcers and sensitivity to sunlight.  Allergic/Immunologic: Negative for susceptible to infections.  Neurological: Negative.  Negative for dizziness, headaches, memory loss and night sweats.  Hematological: Negative.  Negative for swollen glands.  Psychiatric/Behavioral: Positive for sleep disturbance. Negative for depressed mood. The patient is not nervous/anxious.     PMFS History:  Patient Active Problem List   Diagnosis Date Noted  . 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 Idiopathic peripheral neuropathy 08/13/2016  . 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 Former smoker 08/13/2016  . ANA positive 08/06/2016  . Raynaud's disease without gangrene 08/06/2016  . Primary osteoarthritis of both hands 08/06/2016  . Primary osteoarthritis of both feet 08/06/2016  . High risk medication use 08/06/2016  . Primary osteoarthritis of right hip 03/24/2015  . 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 AAA (abdominal aortic aneurysm) ([place])   . Complication of anesthesia    trouble breathing after back surgery  . Degenerative joint disease of spine    buttocks pain -remains a problem  . Family history of adverse reaction to anesthesia    one sister nasea and vomiting  . GERD (gastroesophageal reflux  disease)    occ  . Hyperlipidemia   . Hypothyroidism   . Lupus    Dr. Aura Fey  . Neuromuscular disorder (HCC)    neuropathy in feet  . PONV  (postoperative nausea and vomiting)   . Shortness of breath dyspnea    uses inhaler occ    Family History  Problem Relation Age of Onset  . Diabetes Mother   . Heart disease Mother   . Heart attack Mother   . Bleeding Disorder Father   . Heart disease Sister   . Heart attack Sister   . Cancer Sister   . Deep vein thrombosis Sister   . Diabetes Sister   . Hyperlipidemia Sister   . Hypertension Sister   . Varicose Veins Sister   . AAA (abdominal aortic aneurysm) Sister   . Bleeding Disorder Sister   . Heart disease Brother   . Heart attack Brother   . Hypertension Brother    Past Surgical History:  Procedure Laterality Date  . ABDOMINAL HYSTERECTOMY    . BACK SURGERY  02   L4-L5- fusion  . COLONOSCOPY WITH PROPOFOL N/A 03/17/2016   Procedure: COLONOSCOPY WITH PROPOFOL;  Surgeon: Garlan Fair, MD;  Location: [facility];  Service: Endoscopy;  Laterality: N/A;  . ESOPHAGOGASTRODUODENOSCOPY (EGD) WITH PROPOFOL N/A 03/17/2016   Procedure: ESOPHAGOGASTRODUODENOSCOPY (EGD) WITH PROPOFOL;  Surgeon: Garlan Fair, MD;  [facility];  Service: Endoscopy;  Laterality: N/A;  . JOINT REPLACEMENT    . TONSILLECTOMY AND ADENOIDECTOMY    . TOTAL HIP ARTHROPLASTY Right 03/25/2015   Procedure: TOTAL HIP ARTHROPLASTY ANTERIOR APPROACH;  Surgeon: Frederik Pear, MD;  Location: [facility];  Service: Orthopedics;  Laterality: Right;   Social History   Social History Narrative  . No narrative on file     Objective: Vital Signs: BP 122/72   Pulse 86   Resp 12   Ht 5\' 4"  (1.626 m)   Wt 145 lb (65.8 kg)   BMI 24.89 kg/m    Physical Exam  Constitutional: She is oriented to person, place, and time. She appears well-developed and well-nourished.  HENT:  Head: Normocephalic and atraumatic.  Eyes: Conjunctivae and EOM are normal.  Neck: Normal range of motion.  Cardiovascular: Normal rate, regular rhythm, normal heart sounds and intact distal pulses.   Pulmonary/Chest: Effort  normal and breath sounds normal.  Abdominal: Soft. Bowel sounds are normal.  Lymphadenopathy:    She has no cervical adenopathy.  Neurological: She is alert and oriented to person, place, and time.  Skin: Skin is warm and dry. Capillary refill takes less than 2 seconds.  Psychiatric: She has a normal mood and affect. Her behavior is normal.  Nursing note and vitals reviewed.    Musculoskeletal Exam: C-spine and thoracic spine good range of motion. She is some limitation discomfort range of motion of her lumbar spine. Shoulder joints although joints wrist joints are good range of motion. She has DIP PIP thickening in her hands and feet consistent with osteoarthritis. No synovitis was noted. She has some discomfort range of motion of her right hip joint which is replaced. Knee joints are good range of motion. She is tenderness on palpation over right trochanteric bursa consistent with trochanteric bursitis.  CDAI Exam: CDAI Homunculus Exam:   Joint Counts:  CDAI Tender Joint count: 0 CDAI Swollen Joint count: 0  Global Assessments:  Patient Global Assessment: 3 Provider Global Assessment: 3  CDAI Calculated Score: 6    Investigation: Findings:  July  2017 normal Plaquenil eye exam per patient   CBC Latest Ref Rng & Units 08/17/2016 03/19/2016 03/27/2015  WBC 3.8 - 10.8 K/uL 6.9 5.7 10.7(H)  Hemoglobin 11.7 - 15.5 g/dL 11.1(L) 10.5(L) 8.7(L)  Hematocrit 35.0 - 45.0 % 34.1(L) 32.3(L) 25.5(L)  Platelets 140 - 400 K/uL 379 321 245   CMP Latest Ref Rng & Units 08/17/2016 03/19/2016 03/13/2015  Glucose 65 - 99 mg/dL 86 124(H) 87  BUN 7 - 25 mg/dL 20 15 20   Creatinine 0.60 - 0.93 mg/dL 1.00(H) 0.86 0.78  Sodium 135 - 146 mmol/L 138 140 140  Potassium 3.5 - 5.3 mmol/L 4.8 4.1 4.2  Chloride 98 - 110 mmol/L 103 103 102  CO2 20 - 31 mmol/L 27 27 29   Calcium 8.6 - 10.4 mg/dL 10.4 9.5 10.2  Total Protein 6.1 - 8.1 g/dL 6.9 6.6 -  Total Bilirubin 0.2 - 1.2 mg/dL 0.5 0.5 -  Alkaline Phos 33 -  130 U/L 47 43 -  AST 10 - 35 U/L 17 19 -  ALT 6 - 29 U/L 12 13 -   Imaging: No results found.  Speciality Comments: No specialty comments available.    Procedures:  No procedures performed Allergies: Wasp venom   Assessment / Plan:     Visit Diagnoses: Autoimmune disease ([place]) - +ANA +dsDNA Raynauds , history of skin lupus biopsy proven by Dr. Ubaldo Glassing per patient. -Patient has been off Plaquenil since April 2018. She's not having any increased symptoms. I will check following labs today. Plan: CBC with Differential/Platelet, COMPLETE METABOLIC PANEL WITH GFR, Urinalysis, Routine w reflex microscopic, Sedimentation rate, ANA, C3 and C4, Anti-DNA antibody, double-stranded  High risk medication use - Patient discontinue Plaquenil last visit assuming that it was causing lower back pain.  Raynaud's disease without gangrene: Currently not active.  History of total hip replacement, right: She does have minimal discomfort.  Right trochanteric bursitis: She had some response to physical therapy and cortisone injection. I've encouraged her to do exercises on regular basis.  Primary osteoarthritis of both hands: Joint protection and muscle strengthening discussed.  Trigger middle finger of right hand: Have advised her to schedule an appointment in case her symptoms get worse. She will need ultrasound-guided injection.  Primary osteoarthritis of both feet: Joint protection and muscle strengthening discussed.  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 CBC with Differential/Platelet  . COMPLETE METABOLIC PANEL WITH GFR  . Urinalysis, Routine w reflex microscopic  . Sedimentation rate  . ANA  . C3 and C4  . Anti-DNA antibody, double-stranded   No orders of the defined types were  placed in this encounter.   I will contact her once lab results are available and case fifth to start her on Plaquenil.  Follow-Up Instructions: Return in about 6 months (around 08/17/2017) for Autoimmune disease, Osteoarthritis,DDD.   Bo Merino, MD  Note - This record has been created using Editor, commissioning.  Chart creation errors have been sought, but may not always  have been located. Such creation errors do not reflect on  the standard of medical care.

## 2017-02-09 DIAGNOSIS — Z09 Encounter for follow-up examination after completed treatment for conditions other than malignant neoplasm: Secondary | ICD-10-CM | POA: Diagnosis not present

## 2017-02-09 DIAGNOSIS — Z96641 Presence of right artificial hip joint: Secondary | ICD-10-CM | POA: Diagnosis not present

## 2017-02-09 DIAGNOSIS — M7061 Trochanteric bursitis, right hip: Secondary | ICD-10-CM | POA: Diagnosis not present

## 2017-02-16 ENCOUNTER — Encounter: Payer: Self-pay | Admitting: Rheumatology

## 2017-02-16 ENCOUNTER — Ambulatory Visit (INDEPENDENT_AMBULATORY_CARE_PROVIDER_SITE_OTHER): Payer: Medicare Other | Admitting: Rheumatology

## 2017-02-16 VITALS — BP 122/72 | HR 86 | Resp 12 | Ht 64.0 in | Wt 145.0 lb

## 2017-02-16 DIAGNOSIS — Z87891 Personal history of nicotine dependence: Secondary | ICD-10-CM

## 2017-02-16 DIAGNOSIS — R5383 Other fatigue: Secondary | ICD-10-CM | POA: Diagnosis not present

## 2017-02-16 DIAGNOSIS — D8989 Other specified disorders involving the immune mechanism, not elsewhere classified: Secondary | ICD-10-CM | POA: Diagnosis not present

## 2017-02-16 DIAGNOSIS — I77811 Abdominal aortic ectasia: Secondary | ICD-10-CM | POA: Diagnosis not present

## 2017-02-16 DIAGNOSIS — M19041 Primary osteoarthritis, right hand: Secondary | ICD-10-CM | POA: Diagnosis not present

## 2017-02-16 DIAGNOSIS — M19071 Primary osteoarthritis, right ankle and foot: Secondary | ICD-10-CM

## 2017-02-16 DIAGNOSIS — G609 Hereditary and idiopathic neuropathy, unspecified: Secondary | ICD-10-CM | POA: Diagnosis not present

## 2017-02-16 DIAGNOSIS — M65331 Trigger finger, right middle finger: Secondary | ICD-10-CM

## 2017-02-16 DIAGNOSIS — M47816 Spondylosis without myelopathy or radiculopathy, lumbar region: Secondary | ICD-10-CM

## 2017-02-16 DIAGNOSIS — I7300 Raynaud's syndrome without gangrene: Secondary | ICD-10-CM | POA: Diagnosis not present

## 2017-02-16 DIAGNOSIS — Z96641 Presence of right artificial hip joint: Secondary | ICD-10-CM

## 2017-02-16 DIAGNOSIS — M19072 Primary osteoarthritis, left ankle and foot: Secondary | ICD-10-CM

## 2017-02-16 DIAGNOSIS — Z79899 Other long term (current) drug therapy: Secondary | ICD-10-CM | POA: Diagnosis not present

## 2017-02-16 DIAGNOSIS — M359 Systemic involvement of connective tissue, unspecified: Secondary | ICD-10-CM

## 2017-02-16 DIAGNOSIS — M19042 Primary osteoarthritis, left hand: Secondary | ICD-10-CM

## 2017-02-18 LAB — CBC WITH DIFFERENTIAL/PLATELET
Basophils Absolute: 50 cells/uL (ref 0–200)
Basophils Relative: 0.5 %
Eosinophils Absolute: 267 cells/uL (ref 15–500)
Eosinophils Relative: 2.7 %
HCT: 33.3 % — ABNORMAL LOW (ref 35.0–45.0)
Hemoglobin: 11.2 g/dL — ABNORMAL LOW (ref 11.7–15.5)
Lymphs Abs: 3029 cells/uL (ref 850–3900)
MCH: 30.4 pg (ref 27.0–33.0)
MCHC: 33.6 g/dL (ref 32.0–36.0)
MCV: 90.2 fL (ref 80.0–100.0)
MPV: 8.8 fL (ref 7.5–12.5)
Monocytes Relative: 8.6 %
Neutro Abs: 5702 cells/uL (ref 1500–7800)
Neutrophils Relative %: 57.6 %
Platelets: 355 10*3/uL (ref 140–400)
RBC: 3.69 10*6/uL — ABNORMAL LOW (ref 3.80–5.10)
RDW: 12.5 % (ref 11.0–15.0)
Total Lymphocyte: 30.6 %
WBC mixed population: 851 cells/uL (ref 200–950)
WBC: 9.9 10*3/uL (ref 3.8–10.8)

## 2017-02-18 LAB — URINALYSIS, ROUTINE W REFLEX MICROSCOPIC
Bilirubin Urine: NEGATIVE
Glucose, UA: NEGATIVE
Hgb urine dipstick: NEGATIVE
Ketones, ur: NEGATIVE
Leukocytes, UA: NEGATIVE
Nitrite: NEGATIVE
Protein, ur: NEGATIVE
Specific Gravity, Urine: 1.023 (ref 1.001–1.03)
pH: 5.5 (ref 5.0–8.0)

## 2017-02-18 LAB — COMPLETE METABOLIC PANEL WITH GFR
AG Ratio: 1.7 (calc) (ref 1.0–2.5)
ALT: 16 U/L (ref 6–29)
AST: 13 U/L (ref 10–35)
Albumin: 4.4 g/dL (ref 3.6–5.1)
Alkaline phosphatase (APISO): 50 U/L (ref 33–130)
BUN: 24 mg/dL (ref 7–25)
CO2: 31 mmol/L (ref 20–32)
Calcium: 10.2 mg/dL (ref 8.6–10.4)
Chloride: 100 mmol/L (ref 98–110)
Creat: 0.91 mg/dL (ref 0.60–0.93)
GFR, Est African American: 71 mL/min/{1.73_m2} (ref >=60)
GFR, Est Non African American: 61 mL/min/{1.73_m2} (ref >=60)
Globulin: 2.6 g/dL (calc) (ref 1.9–3.7)
Glucose, Bld: 68 mg/dL (ref 65–99)
Potassium: 4.6 mmol/L (ref 3.5–5.3)
Sodium: 138 mmol/L (ref 135–146)
Total Bilirubin: 0.7 mg/dL (ref 0.2–1.2)
Total Protein: 7 g/dL (ref 6.1–8.1)

## 2017-02-18 LAB — ANTI-NUCLEAR AB-TITER (ANA TITER): ANA Titer 1: 1:40 {titer} — ABNORMAL HIGH

## 2017-02-18 LAB — C3 AND C4
C3 Complement: 147 mg/dL (ref 83–193)
C4 Complement: 16 mg/dL (ref 15–57)

## 2017-02-18 LAB — ANA: Anti Nuclear Antibody(ANA): POSITIVE — AB

## 2017-02-18 LAB — SEDIMENTATION RATE: Sed Rate: 38 mm/h — ABNORMAL HIGH (ref 0–30)

## 2017-02-18 LAB — ANTI-DNA ANTIBODY, DOUBLE-STRANDED: ds DNA Ab: 1 IU/mL

## 2017-02-18 NOTE — Progress Notes (Signed)
Will observe for now. Patient is off Plaquenil and is asymptomatic. Labs show elevated sedimentation rate but complements are normal.

## 2017-02-22 ENCOUNTER — Telehealth: Payer: Self-pay

## 2017-02-22 NOTE — Telephone Encounter (Signed)
Patient advised we could refer her to orthopedics and she states she already has an orthopedic doctor.

## 2017-02-22 NOTE — Telephone Encounter (Signed)
Patient was returning your call concerning lab results.  CB# is [phone].  Please advise.  Thank You.

## 2017-02-22 NOTE — Telephone Encounter (Signed)
Patient advised of lab results. Patient states she had a hip  Injection 2 weeks ago and was recently started on meloxicam. Patient states she has had physical therapy and nothing has helped with her hip pain.What can we advise patient to do?

## 2017-02-22 NOTE — Telephone Encounter (Signed)
Refer her to orthopedics.

## 2017-03-02 DIAGNOSIS — Z23 Encounter for immunization: Secondary | ICD-10-CM | POA: Diagnosis not present

## 2017-03-05 ENCOUNTER — Ambulatory Visit (INDEPENDENT_AMBULATORY_CARE_PROVIDER_SITE_OTHER): Payer: Medicare Other | Admitting: Family

## 2017-03-05 ENCOUNTER — Encounter: Payer: Self-pay | Admitting: Family

## 2017-03-05 ENCOUNTER — Ambulatory Visit (HOSPITAL_COMMUNITY)
Admission: RE | Admit: 2017-03-05 | Discharge: 2017-03-05 | Disposition: A | Discharge status: Home / Self Care | Payer: Medicare Other | Source: Ambulatory Visit | Attending: Family | Admitting: Family

## 2017-03-05 VITALS — BP 123/77 | HR 89 | Temp 97.4°F | Resp 16 | Ht 64.0 in | Wt 144.7 lb

## 2017-03-05 DIAGNOSIS — I77811 Abdominal aortic ectasia: Secondary | ICD-10-CM

## 2017-03-05 DIAGNOSIS — Z87891 Personal history of nicotine dependence: Secondary | ICD-10-CM | POA: Diagnosis not present

## 2017-03-05 NOTE — Patient Instructions (Signed)
Abdominal Aortic Aneurysm Blood pumps away from the heart through tubes (blood vessels) called arteries. Aneurysms are weak or damaged places in the wall of an artery. It bulges out like a balloon. An abdominal aortic aneurysm happens in the main artery of the body (aorta). It can burst or tear, causing bleeding inside the body. This is an emergency. It needs treatment right away. What are the causes? The exact cause is unknown. Things that could cause this problem include:  Fat and other substances building up in the lining of a tube.  Swelling of the walls of a blood vessel.  Certain tissue diseases.  Belly (abdominal) trauma.  An infection in the main artery of the body.  What increases the risk? There are things that make it more likely for you to have an aneurysm. These include:  Being over the age of 76 years old.  Having high blood pressure (hypertension).  Being a female.  Being white.  Being very overweight (obese).  Having a family history of aneurysm.  Using tobacco products.  What are the signs or symptoms? Symptoms depend on the size of the aneurysm and how fast it grows. There may not be symptoms. If symptoms occur, they can include:  Pain (belly, side, lower back, or groin).  Feeling full after eating a small amount of food.  Feeling sick to your stomach (nauseous), throwing up (vomiting), or both.  Feeling a lump in your belly that feels like it is beating (pulsating).  Feeling like you will pass out (faint).  How is this treated?  Medicine to control blood pressure and pain.  Imaging tests to see if the aneurysm gets bigger.  Surgery. How is this prevented? To lessen your chance of getting this condition:  Stop smoking. Stop chewing tobacco.  Limit or avoid alcohol.  Keep your blood pressure, blood sugar, and cholesterol within normal limits.  Eat less salt.  Eat foods low in saturated fats and cholesterol. These are found in animal and  whole dairy products.  Eat more fiber. Fiber is found in whole grains, vegetables, and fruits.  Keep a healthy weight.  Stay active and exercise often.  This information is not intended to replace advice given to you by your health care provider. Make sure you discuss any questions you have with your health care provider. Document Released: 08/22/2012 Document Revised: 10/03/2015 Document Reviewed: 05/27/2012 Elsevier Interactive Patient Education  2017 Elsevier Inc.  

## 2017-03-05 NOTE — Progress Notes (Signed)
[facility]   CC: Follow up Abdominal Aortic Ectasia  History of Present Illness  Janet Giles is a 76 y.o. (August 23, 1940) female whom Dr. 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 She returns today for follow up.   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  The patient does have chronic back pain and has undergone back procedures in the past. She has some intermittent sharp umbilical area pain, pt states her PCP told her this may be due to an umbilical hernia. The patient is not a smoker. She has never had any embolic symptoms. Pt received sclerotherapy with [facility].  Dr. Bridgett Larsson last saw pt on 05/26/13. At that time, based on the patient's exam and diagnostic studies, Dr. Bridgett Larsson recommended every 2 year aortic duplex. If no further growth, can consider discontinuing AAA duplex.  She had right hip replacement surgery on 03/25/15, Dr. Frederik Pear with [facility]. She saw Dr. Glenna Fellows, her spine specialist recently.  Her feet symptoms are bothersome to her since June 2016: numbness, feet and hands stay cold. These symptoms are the same whether she is supine or sitting, more painful on the soles of her feet with walking. She has a lumpy sensation on the sole of her feet when walking.   She denies non healing wounds.  The patient denies history of stroke or TIA symptoms.  Pt Diabetic: No Pt smoker: former smoker, quit in 2001   Past Medical History:  Diagnosis Date  . AAA (abdominal aortic aneurysm) ([place])   . Complication of anesthesia    trouble breathing after back surgery  . Degenerative joint disease of spine    buttocks pain -remains a problem  . Family history of adverse reaction to anesthesia    one sister nasea and vomiting  . GERD (gastroesophageal reflux disease)    occ  . Hyperlipidemia   . Hypothyroidism   . Lupus    Dr. Aura Fey  . Neuromuscular disorder  (H[facility])    neuropathy in feet  . PONV (postoperative nausea and vomiting)   . Shortness of breath dyspnea    uses inhaler occ   Past Surgical History:  Procedure Laterality Date  . ABDOMINAL HYSTERECTOMY    . BACK SURGERY  02   L4-L5- fusion  . COLONOSCOPY WITH PROPOFOL N/A 03/17/2016   Procedure: COLONOSCOPY WITH PROPOFOL;  Surgeon: Garlan Fair, MD;  [facility];  Service: Endoscopy;  Laterality: N/A;  . ESOPHAGOGASTRODUODENOSCOPY (EGD) WITH PROPOFOL N/A 03/17/2016   Procedure: ESOPHAGOGASTRODUODENOSCOPY (EGD) WITH PROPOFOL;  Surgeon: Garlan Fair, MD;  [facility];  Service: Endoscopy;  Laterality: N/A;  . JOINT REPLACEMENT    . TONSILLECTOMY AND ADENOIDECTOMY    . TOTAL HIP ARTHROPLASTY Right 03/25/2015   Procedure: TOTAL HIP ARTHROPLASTY ANTERIOR APPROACH;  Surgeon: Frederik Pear, MD;  Location: [facility];  Service: Orthopedics;  Laterality: Right;   Social History Social History   Social History  . Marital status: Married    Spouse name: N/A  . Number of children: N/A  . Years of education: N/A   Occupational History  . Not on file.   Social History Main Topics  . Smoking status: Former Smoker    Packs/day: 1.50    Years: 35.00    Types: Cigarettes    Quit date: 05/11/2000  . Smokeless tobacco: Never Used  . Alcohol use No  . Drug use: No  . Sexual activity: Not on file  Other Topics Concern  . Not on file   Social History Narrative  . No narrative on file   Family History Family History  Problem Relation Age of Onset  . Diabetes Mother   . Heart disease Mother   . Heart attack Mother   . Bleeding Disorder Father   . Heart disease Sister   . Heart attack Sister   . Cancer Sister   . Deep vein thrombosis Sister   . Diabetes Sister   . Hyperlipidemia Sister   . Hypertension Sister   . Varicose Veins Sister   . AAA (abdominal aortic aneurysm) Sister   . Bleeding Disorder Sister   . Heart disease Brother   . Heart attack Brother    . Hypertension Brother     Current Outpatient Prescriptions on File Prior to Visit  Medication Sig Dispense Refill  . albuterol (PROVENTIL HFA;VENTOLIN HFA) 108 (90 BASE) MCG/ACT inhaler Inhale 1 puff into the lungs every 6 (six) hours as needed for wheezing or shortness of breath.    . Calcium Carbonate-Vitamin D (CALCIUM + D PO) Take 1 tablet by mouth 2 (two) times daily.    . CVS BIOTIN PO Take 1 tablet by mouth daily. 1000 mcg daily bedtime    . gabapentin (NEURONTIN) 300 MG capsule Take 1 capsule (300 mg total) by mouth 2 (two) times daily. (Patient taking differently: Take 300 mg by mouth 3 (three) times daily. ) 180 capsule 0  . HYDROcodone-acetaminophen (NORCO/VICODIN) 5-325 MG tablet Take 1 tablet by mouth every 12 (twelve) hours as needed for moderate pain.     Marland Kitchen levothyroxine (SYNTHROID, LEVOTHROID) 75 MCG tablet Take 1 tablet by mouth daily.    . meloxicam (MOBIC) 15 MG tablet      No current facility-administered medications on file prior to visit.    Allergies  Allergen Reactions  . Wasp Venom Shortness Of Breath and Swelling    ROS: See HPI for pertinent positives and negatives.  Physical Examination  Vitals:   03/05/17 1040  BP: 123/77  Pulse: 89  Resp: 16  Temp: (!) 97.4 F (36.3 C)  TempSrc: Oral  SpO2: 96%  Weight: 144 lb 11.2 oz (65.6 kg)  Height: 5\' 4"  (1.626 m)   Body mass index is 24.84 kg/m.  General: A&O x 3, WD, female  Pulmonary: Sym exp, respirations are non labored, good air movt, CTAB, no rales, rhonchi, or wheezing  Cardiac: RRR, Nl S1, S2, no detected murmur  Vascular: Vessel Right Left  Radial Palpable Palpable  Carotid Palpable, without bruit Palpable, without bruit  Aorta Not palpable N/A  Femoral 1+Palpable 2+Palpable  Popliteal Not palpable Not palpable  PT 2+ Palpable 2+ Palpable  DP not Palpable 1+ Palpable   Gastrointestinal: soft, NTND, -G/R, - HSM, - masses, - CVAT B, no palpable  AAA  Musculoskeletal: M/S 5/5 throughout, extremities without ischemic changes , no edema in bilateral feet, no LDS , few spider veins    Skin: No rashes, no ulcers, no cellulitis.      Neurologic: CN 2-12 intact, Pain and light touch intact in extremities are intact except, Motor exam as listed above.   DATA  AAA Duplex (03/05/2017):  Previous size: 2.7 cm (Date: 03-06-15); Right CIA: 0.93 cm; Left CIA: 0.81 cm  Current size:  2.7 cm (Date: 03/05/17); Right CIA: 0.9 cm; Left CIA: 1.2 cm  Medical Decision Making  The patient is a 76 y.o. female who presents with asymptomatic AAA with no increase in size.  Based on this patient's exam and diagnostic studies, the patient will follow up in 2 years with the following studies: AAA duplex.  Consideration for repair of AAA would be made when the size is 5.5 cm, growth > 1 cm/yr, and symptomatic status.        The patient was given information about AAA including signs, symptoms, treatment, and how to minimize the risk of enlargement and rupture of aneurysms.    I emphasized the importance of maximal medical management including strict control of blood pressure, blood glucose, and lipid levels, antiplatelet agents, obtaining regular exercise, and continued  cessation of smoking.   The patient was advised to call 911 should the patient experience sudden onset abdominal or back pain.   Thank you for allowing [place] to participate in this patient's care.  Clemon Chambers, RN, MSN, FNP-C [facility] Office: [phone]  Clinic Physician: Bridgett Larsson  03/05/2017, 11:31 AM

## 2017-03-25 DIAGNOSIS — L03113 Cellulitis of right upper limb: Secondary | ICD-10-CM | POA: Diagnosis not present

## 2017-03-25 DIAGNOSIS — D485 Neoplasm of uncertain behavior of skin: Secondary | ICD-10-CM | POA: Diagnosis not present

## 2017-03-30 DIAGNOSIS — M47816 Spondylosis without myelopathy or radiculopathy, lumbar region: Secondary | ICD-10-CM | POA: Diagnosis not present

## 2017-04-06 DIAGNOSIS — L570 Actinic keratosis: Secondary | ICD-10-CM | POA: Diagnosis not present

## 2017-04-15 DIAGNOSIS — J069 Acute upper respiratory infection, unspecified: Secondary | ICD-10-CM | POA: Diagnosis not present

## 2017-05-19 ENCOUNTER — Other Ambulatory Visit: Payer: Self-pay | Admitting: Family Medicine

## 2017-05-19 DIAGNOSIS — Z1231 Encounter for screening mammogram for malignant neoplasm of breast: Secondary | ICD-10-CM

## 2017-06-11 DIAGNOSIS — M47816 Spondylosis without myelopathy or radiculopathy, lumbar region: Secondary | ICD-10-CM | POA: Diagnosis not present

## 2017-06-15 ENCOUNTER — Ambulatory Visit
Admission: RE | Admit: 2017-06-15 | Discharge: 2017-06-15 | Disposition: A | Discharge status: Home / Self Care | Payer: Medicare Other | Source: Ambulatory Visit | Attending: Family Medicine | Admitting: Family Medicine

## 2017-06-15 DIAGNOSIS — Z1231 Encounter for screening mammogram for malignant neoplasm of breast: Secondary | ICD-10-CM

## 2017-07-16 DIAGNOSIS — L298 Other pruritus: Secondary | ICD-10-CM | POA: Diagnosis not present

## 2017-07-16 DIAGNOSIS — L820 Inflamed seborrheic keratosis: Secondary | ICD-10-CM | POA: Diagnosis not present

## 2017-07-16 DIAGNOSIS — L570 Actinic keratosis: Secondary | ICD-10-CM | POA: Diagnosis not present

## 2017-08-10 NOTE — Progress Notes (Signed)
Office Visit Note  Patient: Janet Giles             Date of Birth: 02-May-1941           MRN: [id number]             PCP: Alroy Dust, L.Marlou Sa, MD Referring: Alroy Dust, Carlean Jews.Marlou Sa, MD Visit Date: 08/24/2017 Occupation: '@GUAROCC' @    Subjective:  Pain of the Right Hand and Pain of the Left Hand   History of Present Illness: BLUMA BURESH is a 77 y.o. female with history of autoimmune disease, osteoarthritis and DDD.  She continues to have pain and stiffness in her bilateral hands and her feet.  She states she has been having CMC arthritis which causes discomfort in doing routine activities.  She also has peripheral neuropathy which causes discomfort.  Her right trigger middle finger continues to bother.  She was given a course of prednisone for upper respiratory tract infection during last winter which helped her right trochanteric bursitis.  Activities of Daily Living:  Patient reports morning stiffness for 2 minute.   Patient Reports nocturnal pain.  Difficulty dressing/grooming: Denies Difficulty climbing stairs: Reports Difficulty getting out of chair: Denies Difficulty using hands for taps, buttons, cutlery, and/or writing: Reports   Review of Systems  Constitutional: Positive for fatigue. Negative for night sweats, weight gain and weight loss.  HENT: Negative for mouth sores, trouble swallowing, trouble swallowing, mouth dryness and nose dryness.   Eyes: Positive for dryness. Negative for pain, redness and visual disturbance.  Respiratory: Positive for shortness of breath. Negative for cough and difficulty breathing.   Cardiovascular: Negative for chest pain, palpitations, hypertension, irregular heartbeat and swelling in legs/feet.  Gastrointestinal: Negative for blood in stool, constipation and diarrhea.  Endocrine: Negative for increased urination.  Genitourinary: Negative for vaginal dryness.  Musculoskeletal: Positive for arthralgias, joint pain, myalgias, morning  stiffness and myalgias. Negative for joint swelling, muscle weakness and muscle tenderness.  Skin: Positive for rash. Negative for color change, hair loss, skin tightness, ulcers and sensitivity to sunlight.       Right lower extremity  Allergic/Immunologic: Negative for susceptible to infections.  Neurological: Negative for dizziness, memory loss, night sweats and weakness.  Hematological: Negative for swollen glands.  Psychiatric/Behavioral: Positive for sleep disturbance. Negative for depressed mood. The patient is not nervous/anxious.     PMFS History:  Patient Active Problem List   Diagnosis Date Noted  . 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 Idiopathic peripheral neuropathy 08/13/2016  . 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 Former smoker 08/13/2016  . ANA positive 08/06/2016  . Raynaud's disease without gangrene 08/06/2016  . Primary osteoarthritis of both hands 08/06/2016  . Primary osteoarthritis of both feet 08/06/2016  . High risk medication use 08/06/2016  . Primary osteoarthritis of right hip 03/24/2015  . 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 AAA (abdominal aortic aneurysm) ([place])   . Asthma   . Complication of anesthesia    trouble breathing after back surgery  . Degenerative joint disease of spine    buttocks pain -remains a problem  . Family history of adverse reaction to anesthesia    one sister nasea and vomiting  . GERD (gastroesophageal reflux disease)    occ  . Hyperlipidemia   . Hypothyroidism   . Lupus ([place])    Dr. Aura Fey  . Neuromuscular disorder (HCC)    neuropathy in feet  . PONV (postoperative nausea  and vomiting)   . Shortness of breath dyspnea    uses inhaler occ    Family History  Problem Relation Age of Onset  . Diabetes Mother   . Heart disease Mother   . Heart attack Mother   . Bleeding Disorder Father   . Heart disease Sister   . Heart attack Sister   .  Cancer Sister   . Deep vein thrombosis Sister   . Diabetes Sister   . Hyperlipidemia Sister   . Hypertension Sister   . Varicose Veins Sister   . AAA (abdominal aortic aneurysm) Sister   . Bleeding Disorder Sister   . Breast cancer Sister   . Heart disease Brother   . Heart attack Brother   . Hypertension Brother    Past Surgical History:  Procedure Laterality Date  . ABDOMINAL HYSTERECTOMY    . BACK SURGERY  02   L4-L5- fusion  . COLONOSCOPY WITH PROPOFOL N/A 03/17/2016   Procedure: COLONOSCOPY WITH PROPOFOL;  Surgeon: Garlan Fair, MD;  Location: [facility];  Service: Endoscopy;  Laterality: N/A;  . ESOPHAGOGASTRODUODENOSCOPY (EGD) WITH PROPOFOL N/A 03/17/2016   Procedure: ESOPHAGOGASTRODUODENOSCOPY (EGD) WITH PROPOFOL;  Surgeon: Garlan Fair, MD;  [facility]: [facility];  Service: Endoscopy;  Laterality: N/A;  . JOINT REPLACEMENT    . TONSILLECTOMY AND ADENOIDECTOMY    . TOTAL HIP ARTHROPLASTY Right 03/25/2015   Procedure: TOTAL HIP ARTHROPLASTY ANTERIOR APPROACH;  Surgeon: Frederik Pear, MD;  Location: [facility];  Service: Orthopedics;  Laterality: Right;   Social History   Social History Narrative  . Not on file     Objective: Vital Signs: BP (!) 104/53 (BP Location: Right Arm, Patient Position: Sitting, Cuff Size: Small)   Pulse 77   Resp 12   Ht '5\' 4"'  (1.626 m)   Wt 145 lb (65.8 kg)   BMI 24.89 kg/m    Physical Exam  Constitutional: She is oriented to person, place, and time. She appears well-developed and well-nourished.  HENT:  Head: Normocephalic and atraumatic.  Eyes: Conjunctivae and EOM are normal.  Neck: Normal range of motion.  Cardiovascular: Normal rate, regular rhythm, normal heart sounds and intact distal pulses.  Pulmonary/Chest: Effort normal and breath sounds normal.  Abdominal: Soft. Bowel sounds are normal.  Lymphadenopathy:    She has no cervical adenopathy.  Neurological: She is alert and oriented to person, place, and time.  Skin:  Skin is warm and dry. Capillary refill takes less than 2 seconds.  Erythema and excoriation marks over right lower extremity  Psychiatric: She has a normal mood and affect. Her behavior is normal.  Nursing note and vitals reviewed.    Musculoskeletal Exam: Spine thoracic lumbar spine good range of motion.  She is some discomfort range of motion of the lumbar spine.  Shoulder joints elbow joints wrist joints were in good range of motion.  She has bilateral CMC DIP PIP thickening with the left CMC pain.  She also had right third trigger finger.  Hip joints knee joints ankles MTPs PIPs were in good range of motion.  She has right total hip replacement which is doing well.  CDAI Exam: No CDAI exam completed.   10/18 ANA 1:40, C3,C4 normal Ds negative, ESR 38 Investigation: No additional findings. CBC Latest Ref Rng & Units 02/16/2017 08/17/2016 03/19/2016  WBC 3.8 - 10.8 Thousand/uL 9.9 6.9 5.7  Hemoglobin 11.7 - 15.5 g/dL 11.2(L) 11.1(L) 10.5(L)  Hematocrit 35.0 - 45.0 % 33.3(L) 34.1(L) 32.3(L)  Platelets 140 -  400 Thousand/uL 355 379 321   CMP Latest Ref Rng & Units 02/16/2017 08/17/2016 03/19/2016  Glucose 65 - 99 mg/dL 68 86 124(H)  BUN 7 - 25 mg/dL '24 20 15  ' Creatinine 0.60 - 0.93 mg/dL 0.91 1.00(H) 0.86  Sodium 135 - 146 mmol/L 138 138 140  Potassium 3.5 - 5.3 mmol/L 4.6 4.8 4.1  Chloride 98 - 110 mmol/L 100 103 103  CO2 20 - 32 mmol/L '31 27 27  ' Calcium 8.6 - 10.4 mg/dL 10.2 10.4 9.5  Total Protein 6.1 - 8.1 g/dL 7.0 6.9 6.6  Total Bilirubin 0.2 - 1.2 mg/dL 0.7 0.5 0.5  Alkaline Phos 33 - 130 U/L - 47 43  AST 10 - 35 U/L '13 17 19  ' ALT 6 - 29 U/L '16 12 13    ' Imaging: Dg Chest 2 View  Result Date: 08/18/2017 CLINICAL DATA:  Increasing shortness of breath.  History of asthma. EXAM: CHEST - 2 VIEW COMPARISON:  Chest x-ray dated March 13, 2015. FINDINGS: The heart size and mediastinal contours are within normal limits. Normal pulmonary vascularity. Atherosclerotic calcification of the  aortic arch. No focal consolidation, pleural effusion, or pneumothorax. The lungs remain mildly hyperinflated. No acute osseous abnormality. IMPRESSION: Mild hyperinflation.  No active cardiopulmonary disease. Electronically Signed   By: Titus Dubin M.D.   On: 08/18/2017 08:00    Speciality Comments: No specialty comments available.    Procedures:  No procedures performed Allergies: Wasp venom and Doxycycline   Assessment / Plan:     Visit Diagnoses: Autoimmune disease ([place]) - +ANA 1:40, ds DNA -, Raynauds , history of skin lupus biopsy proven by Dr. Ubaldo Glassing per patient. -Patient has been off Plaquenil since April 2018.  She has not noticed any improvement on Plaquenil in the past and no flare of Plaquenil.  Her most recent autoimmune labs in October 2018 were negative.  She continues to have some rash on her right lower extremity which she is not too concerned about that.  High risk medication use - Patient discontinue Plaquenil last visit assuming that it was causing lower back pain.  Raynaud's disease without gangrene: She states Raynauds is not active currently.  Primary osteoarthritis of both hands - Left CMC pain.  She had good response to cortisone injection in the past.  She would like to schedule another injection.  Primary osteoarthritis of both feet: Proper fitting shoes were discussed.  Trigger middle finger of right hand: We will schedule ultrasound-guided trigger finger injection.  She had good response to injection in the past.  History of total hip replacement, right: Doing well  DDD (degenerative disc disease), lumbar: She continues to have some lower back pain. 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  No orders of the defined types were placed in this encounter.   Face-to-face time spent with patient was 30 minutes.  Greater  than 50% of time was spent in counseling and coordination of care.  Follow-Up Instructions: Return in about 6 months (around 02/23/2018) for Autoimmune disease, Osteoarthritis,DDD.   Bo Merino, MD  Note - This record has been created using Editor, commissioning.  Chart creation errors have been sought, but may not always  have been located. Such creation errors do not reflect on  the standard of medical care.

## 2017-08-17 ENCOUNTER — Other Ambulatory Visit: Payer: Self-pay | Admitting: Family Medicine

## 2017-08-17 ENCOUNTER — Ambulatory Visit
Admission: RE | Admit: 2017-08-17 | Discharge: 2017-08-17 | Disposition: A | Discharge status: Home / Self Care | Payer: Medicare Other | Source: Ambulatory Visit | Attending: Family Medicine | Admitting: Family Medicine

## 2017-08-17 DIAGNOSIS — R0602 Shortness of breath: Secondary | ICD-10-CM | POA: Diagnosis not present

## 2017-08-17 DIAGNOSIS — G629 Polyneuropathy, unspecified: Secondary | ICD-10-CM | POA: Diagnosis not present

## 2017-08-17 DIAGNOSIS — E7800 Pure hypercholesterolemia, unspecified: Secondary | ICD-10-CM | POA: Diagnosis not present

## 2017-08-17 DIAGNOSIS — J45909 Unspecified asthma, uncomplicated: Secondary | ICD-10-CM

## 2017-08-17 DIAGNOSIS — E039 Hypothyroidism, unspecified: Secondary | ICD-10-CM | POA: Diagnosis not present

## 2017-08-17 DIAGNOSIS — M8580 Other specified disorders of bone density and structure, unspecified site: Secondary | ICD-10-CM | POA: Diagnosis not present

## 2017-08-24 ENCOUNTER — Ambulatory Visit (INDEPENDENT_AMBULATORY_CARE_PROVIDER_SITE_OTHER): Payer: Medicare Other | Admitting: Rheumatology

## 2017-08-24 ENCOUNTER — Encounter: Payer: Self-pay | Admitting: Rheumatology

## 2017-08-24 VITALS — BP 104/53 | HR 77 | Resp 12 | Ht 64.0 in | Wt 145.0 lb

## 2017-08-24 DIAGNOSIS — D8989 Other specified disorders involving the immune mechanism, not elsewhere classified: Secondary | ICD-10-CM

## 2017-08-24 DIAGNOSIS — M65331 Trigger finger, right middle finger: Secondary | ICD-10-CM

## 2017-08-24 DIAGNOSIS — Z79899 Other long term (current) drug therapy: Secondary | ICD-10-CM | POA: Diagnosis not present

## 2017-08-24 DIAGNOSIS — M19072 Primary osteoarthritis, left ankle and foot: Secondary | ICD-10-CM

## 2017-08-24 DIAGNOSIS — M5136 Other intervertebral disc degeneration, lumbar region: Secondary | ICD-10-CM

## 2017-08-24 DIAGNOSIS — I7300 Raynaud's syndrome without gangrene: Secondary | ICD-10-CM

## 2017-08-24 DIAGNOSIS — Z96641 Presence of right artificial hip joint: Secondary | ICD-10-CM | POA: Diagnosis not present

## 2017-08-24 DIAGNOSIS — Z87891 Personal history of nicotine dependence: Secondary | ICD-10-CM

## 2017-08-24 DIAGNOSIS — M19041 Primary osteoarthritis, right hand: Secondary | ICD-10-CM

## 2017-08-24 DIAGNOSIS — M359 Systemic involvement of connective tissue, unspecified: Secondary | ICD-10-CM

## 2017-08-24 DIAGNOSIS — I77811 Abdominal aortic ectasia: Secondary | ICD-10-CM | POA: Diagnosis not present

## 2017-08-24 DIAGNOSIS — M19071 Primary osteoarthritis, right ankle and foot: Secondary | ICD-10-CM | POA: Diagnosis not present

## 2017-08-24 DIAGNOSIS — M19042 Primary osteoarthritis, left hand: Secondary | ICD-10-CM

## 2017-08-24 DIAGNOSIS — R5383 Other fatigue: Secondary | ICD-10-CM

## 2017-08-24 DIAGNOSIS — G609 Hereditary and idiopathic neuropathy, unspecified: Secondary | ICD-10-CM

## 2017-09-10 DIAGNOSIS — G629 Polyneuropathy, unspecified: Secondary | ICD-10-CM | POA: Diagnosis not present

## 2017-09-10 DIAGNOSIS — M47816 Spondylosis without myelopathy or radiculopathy, lumbar region: Secondary | ICD-10-CM | POA: Diagnosis not present

## 2017-09-15 ENCOUNTER — Ambulatory Visit (INDEPENDENT_AMBULATORY_CARE_PROVIDER_SITE_OTHER): Payer: Medicare Other | Admitting: Rheumatology

## 2017-09-15 DIAGNOSIS — M79645 Pain in left finger(s): Secondary | ICD-10-CM

## 2017-09-15 DIAGNOSIS — M65331 Trigger finger, right middle finger: Secondary | ICD-10-CM

## 2017-09-15 DIAGNOSIS — G629 Polyneuropathy, unspecified: Secondary | ICD-10-CM | POA: Diagnosis not present

## 2017-09-15 MED ORDER — LIDOCAINE HCL (PF) 1 % IJ SOLN
0.5000 mL | INTRAMUSCULAR | Status: AC | PRN
Start: 2017-09-15 — End: 2017-09-15
  Administered 2017-09-15: .5 mL

## 2017-09-15 MED ORDER — LIDOCAINE HCL 1 % IJ SOLN
0.5000 mL | INTRAMUSCULAR | Status: AC | PRN
Start: 2017-09-15 — End: 2017-09-15
  Administered 2017-09-15: .5 mL

## 2017-09-15 MED ORDER — TRIAMCINOLONE ACETONIDE 40 MG/ML IJ SUSP
10.0000 mg | INTRAMUSCULAR | Status: AC | PRN
  Administered 2017-09-15: 10 mg

## 2017-09-15 MED ORDER — TRIAMCINOLONE ACETONIDE 40 MG/ML IJ SUSP
20.0000 mg | INTRAMUSCULAR | Status: AC | PRN
  Administered 2017-09-15: 20 mg via INTRA_ARTICULAR

## 2017-09-15 NOTE — Progress Notes (Signed)
   Procedure Note  Patient: Janet Giles             Date of Birth: Sep 28, 1940           MRN: [id number]             Visit Date: 09/15/2017  Procedures: Visit Diagnoses: Trigger middle finger of right hand  Pain of left thumb  Small Joint Inj: L thumb CMC on 09/15/2017 3:03 PM Indications: pain Details: 27 G needle, ultrasound-guided radial approach  Spinal Needle: No  Medications: 0.5 mL lidocaine (PF) 1 %; 20 mg triamcinolone acetonide 40 MG/ML Aspirate: 0 mL Outcome: tolerated well, no immediate complications Procedure, treatment alternatives, risks and benefits explained, specific risks discussed. Consent was given by the patient. Immediately prior to procedure a time out was called to verify the correct patient, procedure, equipment, support staff and site/side marked as required. Patient was prepped and draped in the usual sterile fashion.   Hand/UE Inj: R long A1 for trigger finger on 09/15/2017 3:05 PM Indications: pain, tendon swelling and therapeutic Details: 27 G needle, ultrasound-guided volar approach Medications: 0.5 mL lidocaine 1 %; 10 mg triamcinolone acetonide 40 MG/ML Aspirate: 0 mL Procedure, treatment alternatives, risks and benefits explained, specific risks discussed. Immediately prior to procedure a time out was called to verify the correct patient, procedure, equipment, support staff and site/side marked as required. Patient was prepped and draped in the usual sterile fashion.    Postinjection instructions were given.  A splint was applied to her right middle finger. Bo Merino, MD

## 2017-10-01 DIAGNOSIS — D485 Neoplasm of uncertain behavior of skin: Secondary | ICD-10-CM | POA: Diagnosis not present

## 2017-10-01 DIAGNOSIS — D1801 Hemangioma of skin and subcutaneous tissue: Secondary | ICD-10-CM | POA: Diagnosis not present

## 2017-10-01 DIAGNOSIS — D2271 Melanocytic nevi of right lower limb, including hip: Secondary | ICD-10-CM | POA: Diagnosis not present

## 2017-10-01 DIAGNOSIS — L570 Actinic keratosis: Secondary | ICD-10-CM | POA: Diagnosis not present

## 2017-10-01 DIAGNOSIS — L821 Other seborrheic keratosis: Secondary | ICD-10-CM | POA: Diagnosis not present

## 2017-10-01 DIAGNOSIS — L298 Other pruritus: Secondary | ICD-10-CM | POA: Diagnosis not present

## 2017-10-01 DIAGNOSIS — L308 Other specified dermatitis: Secondary | ICD-10-CM | POA: Diagnosis not present

## 2017-10-01 DIAGNOSIS — L820 Inflamed seborrheic keratosis: Secondary | ICD-10-CM | POA: Diagnosis not present

## 2017-12-15 DIAGNOSIS — G629 Polyneuropathy, unspecified: Secondary | ICD-10-CM | POA: Diagnosis not present

## 2017-12-15 DIAGNOSIS — M47816 Spondylosis without myelopathy or radiculopathy, lumbar region: Secondary | ICD-10-CM | POA: Diagnosis not present

## 2017-12-31 DIAGNOSIS — L218 Other seborrheic dermatitis: Secondary | ICD-10-CM | POA: Diagnosis not present

## 2017-12-31 DIAGNOSIS — L570 Actinic keratosis: Secondary | ICD-10-CM | POA: Diagnosis not present

## 2017-12-31 DIAGNOSIS — D485 Neoplasm of uncertain behavior of skin: Secondary | ICD-10-CM | POA: Diagnosis not present

## 2018-01-31 NOTE — Progress Notes (Signed)
Office Visit Note  Patient: Janet Giles             Date of Birth: 1940/05/21           MRN: [id number]             PCP: Alroy Dust, L.Marlou Sa, MD Referring: Alroy Dust, Carlean Jews.Marlou Sa, MD Visit Date: 02/14/2018 Occupation: @GUAROCC @  Subjective:  Left CMC joint pain   History of Present Illness: Janet Giles is a 77 y.o. female with history of autoimmune disease, Raynaud's disease, and osteoarthritis.  Patient reports that she has been having worsening peripheral neuropathy bilaterally.  She continues to take gabapentin 300 mg 4 times daily.  She reports that she is having increased discomfort in her left CMC joint.  She states that she wears a left CMC joint brace intermittently.  She denies any joint swelling at this time.  She has chronic lower back pain and takes hydrocodone as needed.  Her right hip replacement is doing well.  She takes Mobic as needed.  She has been off of Plaquenil for over a year now.  She has one oral ulceration and will be following up with her dentist soon.  She continues to have eye dryness but denies any mouth dryness.  Significant itching on her scalp especially at night.  She states she also has some redness on her right lower extremity bandages.  She denies any exposure to new products.  She reports that she followed up with dermatology.  She continues to have some sun sensitivity and wears sun protective clothing as well as a hat when outside for prolonged periods of time.    Activities of Daily Living:  Patient reports morning stiffness for 1 hour.   Patient Reports nocturnal pain.  Difficulty dressing/grooming: Denies Difficulty climbing stairs: Denies Difficulty getting out of chair: Denies Difficulty using hands for taps, buttons, cutlery, and/or writing: Reports  Review of Systems  Constitutional: Negative for fatigue.  HENT: Positive for mouth sores. Negative for trouble swallowing, trouble swallowing, mouth dryness and nose dryness.   Eyes: Negative  for pain, visual disturbance and dryness.  Respiratory: Negative for cough, hemoptysis, shortness of breath and difficulty breathing.   Cardiovascular: Negative for chest pain, palpitations, hypertension and swelling in legs/feet.  Gastrointestinal: Negative for abdominal pain, blood in stool, constipation, diarrhea, nausea and vomiting.  Genitourinary: Negative for painful urination, nocturia and pelvic pain.  Musculoskeletal: Positive for arthralgias, joint pain, joint swelling and morning stiffness. Negative for myalgias, muscle weakness, muscle tenderness and myalgias.  Skin: Positive for rash. Negative for color change, pallor, hair loss, nodules/bumps, skin tightness, ulcers and sensitivity to sunlight.  Allergic/Immunologic: Negative for susceptible to infections.  Neurological: Negative for dizziness, light-headedness, numbness, headaches, memory loss and weakness.  Hematological: Negative for swollen glands.  Psychiatric/Behavioral: Negative for depressed mood, confusion and sleep disturbance. The patient is not nervous/anxious.     PMFS History:  Patient Active Problem List   Diagnosis Date Noted  . 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 Idiopathic peripheral neuropathy 08/13/2016  . 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 Former smoker 08/13/2016  . ANA positive 08/06/2016  . Raynaud's disease without gangrene 08/06/2016  . Primary osteoarthritis of both hands 08/06/2016  . Primary osteoarthritis of both feet 08/06/2016  . High risk medication use 08/06/2016  . Primary osteoarthritis of right hip 03/24/2015  . 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 AAA (abdominal aortic aneurysm) ([place])   .  Asthma   . Complication of anesthesia    trouble breathing after back surgery  . Degenerative joint disease of spine    buttocks pain -remains a problem  . Family history of adverse reaction to anesthesia    one sister nasea and  vomiting  . GERD (gastroesophageal reflux disease)    occ  . Hyperlipidemia   . Hypothyroidism   . Lupus ([place])    Dr. Aura Fey  . Neuromuscular disorder ([facility])    neuropathy in feet  . PONV (postoperative nausea and vomiting)   . Shortness of breath dyspnea    uses inhaler occ    Family History  Problem Relation Age of Onset  . Diabetes Mother   . Heart disease Mother   . Heart attack Mother   . Bleeding Disorder Father   . Heart disease Sister   . Heart attack Sister   . Cancer Sister   . Deep vein thrombosis Sister   . Diabetes Sister   . Hyperlipidemia Sister   . Hypertension Sister   . Varicose Veins Sister   . AAA (abdominal aortic aneurysm) Sister   . Bleeding Disorder Sister   . Breast cancer Sister   . Heart disease Brother   . Heart attack Brother   . Hypertension Brother    Past Surgical History:  Procedure Laterality Date  . ABDOMINAL HYSTERECTOMY    . BACK SURGERY  02   L4-L5- fusion  . COLONOSCOPY WITH PROPOFOL N/A 03/17/2016   Procedure: COLONOSCOPY WITH PROPOFOL;  Surgeon: Garlan Fair, MD;  [facility];  Service: Endoscopy;  Laterality: N/A;  . ESOPHAGOGASTRODUODENOSCOPY (EGD) WITH PROPOFOL N/A 03/17/2016   Procedure: ESOPHAGOGASTRODUODENOSCOPY (EGD) WITH PROPOFOL;  Surgeon: Garlan Fair, MD;  [facility];  Service: Endoscopy;  Laterality: N/A;  . JOINT REPLACEMENT    . TONSILLECTOMY AND ADENOIDECTOMY    . TOTAL HIP ARTHROPLASTY Right 03/25/2015   Procedure: TOTAL HIP ARTHROPLASTY ANTERIOR APPROACH;  Surgeon: Frederik Pear, MD;  Location: [facility];  Service: Orthopedics;  Laterality: Right;   Social History   Social History Narrative  . Not on file    Objective: Vital Signs: BP 119/70 (BP Location: Left Arm, Patient Position: Sitting, Cuff Size: Normal)   Pulse 82   Resp 12   Ht 5\' 4"  (1.626 m)   Wt 141 lb 3.2 oz (64 kg)   BMI 24.24 kg/m    Physical Exam  Constitutional: She is oriented to person,  place, and time. She appears well-developed and well-nourished.  HENT:  Head: Normocephalic and atraumatic.  Eyes: Conjunctivae and EOM are normal.  Neck: Normal range of motion.  Cardiovascular: Normal rate, regular rhythm, normal heart sounds and intact distal pulses.  Pulmonary/Chest: Effort normal and breath sounds normal.  Abdominal: Soft. Bowel sounds are normal.  Lymphadenopathy:    She has no cervical adenopathy.  Neurological: She is alert and oriented to person, place, and time.  Skin: Skin is warm and dry. Capillary refill takes less than 2 seconds.  Psychiatric: She has a normal mood and affect. Her behavior is normal.  Nursing note and vitals reviewed.    Musculoskeletal Exam: C-spine limited range of motion.  Resting lumbar spine good range of motion.  No midline spinal tenderness.  No SI joint tenderness.  Shoulder joints, elbow joints, wrist joints good range of motion with no synovitis.  She has PIP and DIP synovial thickening consistent with osteoarthritis of bilateral hands.  She has bilateral CMC joint synovial thickening.  Left CMC joint tenderness.  Hip joints good range of motion with no discomfort.  No tenderness of trochanter bursa bilaterally.  She is good range of motion bilateral knee joints with no discomfort.  No warmth or effusion noted.  No tenderness or swelling of ankle joints.  No Achilles tenderness or plantar fasciitis.  CDAI Exam: CDAI Score: Not documented Patient Global Assessment: Not documented; Provider Global Assessment: Not documented Swollen: Not documented; Tender: Not documented Joint Exam   Not documented   There is currently no information documented on the homunculus. Go to the Rheumatology activity and complete the homunculus joint exam.  Investigation: No additional findings.  Imaging: No results found.  Recent Labs: Lab Results  Component Value Date   WBC 9.9 02/16/2017   HGB 11.2 (L) 02/16/2017   PLT 355 02/16/2017   NA  138 02/16/2017   K 4.6 02/16/2017   CL 100 02/16/2017   CO2 31 02/16/2017   GLUCOSE 68 02/16/2017   BUN 24 02/16/2017   CREATININE 0.91 02/16/2017   BILITOT 0.7 02/16/2017   ALKPHOS 47 08/17/2016   AST 13 02/16/2017   ALT 16 02/16/2017   PROT 7.0 02/16/2017   ALBUMIN 4.3 08/17/2016   CALCIUM 10.2 02/16/2017   GFRAA 71 02/16/2017    Speciality Comments: No specialty comments available.  Procedures:  No procedures performed Allergies: Wasp venom and Doxycycline   Assessment / Plan:     Visit Diagnoses: Autoimmune disease ([place]) - +ANA 1:40, ds DNA -, Raynauds , history of skin lupus biopsy proven by Dr. Ubaldo Glassing per patient. -Patient has been off Plaquenil since April 2018.  She has no synovitis on exam.  She has left CMC joint pain and wears brace intermittently.  She has PIP and DIP synovial thickening consistent with osteoarthritis of both hands.  She continues to have eye dryness but denies any mouth dryness.  No parotid swelling was noted on exam.  She has intermittent symptoms of Raynaud's.  No digital ulcerations or signs of gangrene were noted.  She continues to have some sensitivity.  She is encouraged to wear sunscreen on a regular basis.  She was a hat as well as sun protective clothing regularly.  She has been having itchiness of the scalp especially at night.  She was evaluated by dermatology.  She has an area of hair breakage.  She was advised to follow-up with dermatology for further evaluation.  She continues to have chronic fatigue.  We will check autoimmune labs today.- Plan: CBC with Differential/Platelet, COMPLETE METABOLIC PANEL WITH GFR, Urinalysis, Routine w reflex microscopic, Anti-DNA antibody, double-stranded, C3 and C4, Sedimentation rate, ANA  High risk medication use - Patient discontinue Plaquenil previously due to assuming it was worsening lower back pain.   Raynaud's disease without gangrene:  She has intermittent symptoms of Raynaud's.  She has no digital  ulcerations or signs of gangrene.    Primary osteoarthritis of both hands: She has PIP and DIP synovial thickening consistent with osteoarthritis of bilateral hands.  She has bilateral CMC joint synovial thickening.  She is complete fist formation bilaterally.  No synovitis was noted.  Joint protection and muscle strengthening were discussed.  Primary osteoarthritis of both feet: She has osteoarthritic changes in bilateral feet.  She has no joint discomfort at this time.  She has peripheral neuropathy bilaterally.  History of total hip replacement, right: Doing well.  Good range of motion with no discomfort.  DDD (degenerative disc disease), lumbar: Chronic pain.  She takes hydrocodone  for pain relief as needed.  Idiopathic peripheral neuropathy: She reports worsening peripheral neuropathy.  She is on gabapentin 300 mg 4 times daily.  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 CBC with Differential/Platelet  . COMPLETE METABOLIC PANEL WITH GFR  . Urinalysis, Routine w reflex microscopic  . Anti-DNA antibody, double-stranded  . C3 and C4  . Sedimentation rate  . ANA   No orders of the defined types were placed in this encounter.    Follow-Up Instructions: Return in about 6 months (around 08/16/2018) for Autoimmune Disease, Raynaud's syndrome, Osteoarthritis.   Ofilia Neas, PA-C  Note - This record has been created using Dragon software.  Chart creation errors have been sought, but may not always  have been located. Such creation errors do not reflect on  the standard of medical care.

## 2018-02-14 ENCOUNTER — Ambulatory Visit (INDEPENDENT_AMBULATORY_CARE_PROVIDER_SITE_OTHER): Payer: Medicare Other | Admitting: Physician Assistant

## 2018-02-14 ENCOUNTER — Encounter: Payer: Self-pay | Admitting: Physician Assistant

## 2018-02-14 VITALS — BP 119/70 | HR 82 | Resp 12 | Ht 64.0 in | Wt 141.2 lb

## 2018-02-14 DIAGNOSIS — M19072 Primary osteoarthritis, left ankle and foot: Secondary | ICD-10-CM

## 2018-02-14 DIAGNOSIS — I7300 Raynaud's syndrome without gangrene: Secondary | ICD-10-CM | POA: Diagnosis not present

## 2018-02-14 DIAGNOSIS — M65331 Trigger finger, right middle finger: Secondary | ICD-10-CM | POA: Diagnosis not present

## 2018-02-14 DIAGNOSIS — M5136 Other intervertebral disc degeneration, lumbar region: Secondary | ICD-10-CM | POA: Diagnosis not present

## 2018-02-14 DIAGNOSIS — Z79899 Other long term (current) drug therapy: Secondary | ICD-10-CM | POA: Diagnosis not present

## 2018-02-14 DIAGNOSIS — M19071 Primary osteoarthritis, right ankle and foot: Secondary | ICD-10-CM | POA: Diagnosis not present

## 2018-02-14 DIAGNOSIS — R5383 Other fatigue: Secondary | ICD-10-CM | POA: Diagnosis not present

## 2018-02-14 DIAGNOSIS — Z87891 Personal history of nicotine dependence: Secondary | ICD-10-CM

## 2018-02-14 DIAGNOSIS — G609 Hereditary and idiopathic neuropathy, unspecified: Secondary | ICD-10-CM

## 2018-02-14 DIAGNOSIS — M51369 Other intervertebral disc degeneration, lumbar region without mention of lumbar back pain or lower extremity pain: Secondary | ICD-10-CM

## 2018-02-14 DIAGNOSIS — M359 Systemic involvement of connective tissue, unspecified: Secondary | ICD-10-CM | POA: Diagnosis not present

## 2018-02-14 DIAGNOSIS — I77811 Abdominal aortic ectasia: Secondary | ICD-10-CM

## 2018-02-14 DIAGNOSIS — M19041 Primary osteoarthritis, right hand: Secondary | ICD-10-CM

## 2018-02-14 DIAGNOSIS — M19042 Primary osteoarthritis, left hand: Secondary | ICD-10-CM

## 2018-02-14 DIAGNOSIS — Z96641 Presence of right artificial hip joint: Secondary | ICD-10-CM

## 2018-02-15 NOTE — Progress Notes (Signed)
CBC revealed findings consistent with worsening anemia.  Please notify patient and forward results to PCP.  Sed rate stable-likely elevated due to chronic anemia.  Calcium is elevated. Please advise patient to hold off on taking calcium supplement at this time. UA abnormal.  Please ask if patient is experiencing symptoms of a UTI.  Please forward results to PCP.

## 2018-02-16 ENCOUNTER — Telehealth: Payer: Self-pay | Admitting: Rheumatology

## 2018-02-16 LAB — URINALYSIS, ROUTINE W REFLEX MICROSCOPIC
Bilirubin Urine: NEGATIVE
Glucose, UA: NEGATIVE
Hgb urine dipstick: NEGATIVE
Nitrite: NEGATIVE
Specific Gravity, Urine: 1.025 (ref 1.001–1.03)
pH: 5.5 (ref 5.0–8.0)

## 2018-02-16 LAB — SEDIMENTATION RATE: Sed Rate: 39 mm/h — ABNORMAL HIGH (ref 0–30)

## 2018-02-16 LAB — CBC WITH DIFFERENTIAL/PLATELET
Basophils Absolute: 32 cells/uL (ref 0–200)
Basophils Relative: 0.5 %
Eosinophils Absolute: 192 cells/uL (ref 15–500)
Eosinophils Relative: 3 %
HCT: 32.4 % — ABNORMAL LOW (ref 35.0–45.0)
Hemoglobin: 10.9 g/dL — ABNORMAL LOW (ref 11.7–15.5)
Lymphs Abs: 2061 cells/uL (ref 850–3900)
MCH: 30.8 pg (ref 27.0–33.0)
MCHC: 33.6 g/dL (ref 32.0–36.0)
MCV: 91.5 fL (ref 80.0–100.0)
MPV: 9 fL (ref 7.5–12.5)
Monocytes Relative: 9.7 %
Neutro Abs: 3494 cells/uL (ref 1500–7800)
Neutrophils Relative %: 54.6 %
Platelets: 353 10*3/uL (ref 140–400)
RBC: 3.54 10*6/uL — ABNORMAL LOW (ref 3.80–5.10)
RDW: 12.1 % (ref 11.0–15.0)
Total Lymphocyte: 32.2 %
WBC mixed population: 621 cells/uL (ref 200–950)
WBC: 6.4 10*3/uL (ref 3.8–10.8)

## 2018-02-16 LAB — MICROSCOPIC MESSAGE

## 2018-02-16 LAB — COMPLETE METABOLIC PANEL WITH GFR
AG Ratio: 1.6 (calc) (ref 1.0–2.5)
ALT: 13 U/L (ref 6–29)
AST: 17 U/L (ref 10–35)
Albumin: 4.5 g/dL (ref 3.6–5.1)
Alkaline phosphatase (APISO): 52 U/L (ref 33–130)
BUN: 20 mg/dL (ref 7–25)
CO2: 31 mmol/L (ref 20–32)
Calcium: 10.6 mg/dL — ABNORMAL HIGH (ref 8.6–10.4)
Chloride: 101 mmol/L (ref 98–110)
Creat: 0.81 mg/dL (ref 0.60–0.93)
GFR, Est African American: 81 mL/min/{1.73_m2} (ref >=60)
GFR, Est Non African American: 70 mL/min/{1.73_m2} (ref >=60)
Globulin: 2.8 g/dL (calc) (ref 1.9–3.7)
Glucose, Bld: 70 mg/dL (ref 65–99)
Potassium: 5.3 mmol/L (ref 3.5–5.3)
Sodium: 137 mmol/L (ref 135–146)
Total Bilirubin: 0.6 mg/dL (ref 0.2–1.2)
Total Protein: 7.3 g/dL (ref 6.1–8.1)

## 2018-02-16 LAB — ANTI-DNA ANTIBODY, DOUBLE-STRANDED: ds DNA Ab: 2 IU/mL

## 2018-02-16 LAB — C3 AND C4
C3 Complement: 167 mg/dL (ref 83–193)
C4 Complement: 19 mg/dL (ref 15–57)

## 2018-02-16 LAB — ANA: Anti Nuclear Antibody(ANA): POSITIVE — AB

## 2018-02-16 LAB — ANTI-NUCLEAR AB-TITER (ANA TITER): ANA Titer 1: 1:40 {titer} — ABNORMAL HIGH

## 2018-02-16 NOTE — Telephone Encounter (Signed)
Patient called stating she was returning your call regarding her labwork results. 

## 2018-02-16 NOTE — Telephone Encounter (Signed)
Patient advised CBC revealed findings consistent with worsening anemia. Sed rate stable-likely elevated due to chronic anemia. Calcium is elevated. Please advise patient to hold off on taking calcium supplement at this time. UA abnormal. Please ask if patient is experiencing symptoms of a UTI. Copy sent to PCP. Patient will follow up with PCP for possible UTI as she has been feeling uncomfortable and having a strong odor to her urine.

## 2018-02-17 ENCOUNTER — Telehealth: Payer: Self-pay | Admitting: Rheumatology

## 2018-02-17 NOTE — Telephone Encounter (Signed)
Patient left a voicemail stating she was returning your call.  Patient states she is out of town and can be reached on her cell [phone]

## 2018-02-17 NOTE — Progress Notes (Signed)
ANA titer stable. Complements and dsDNA are WNL.

## 2018-02-18 NOTE — Telephone Encounter (Signed)
Patient advised ANA titer stable. Complements and dsDNA are WNL.

## 2018-02-23 ENCOUNTER — Ambulatory Visit: Payer: 59 | Admitting: Rheumatology

## 2018-02-23 DIAGNOSIS — M8580 Other specified disorders of bone density and structure, unspecified site: Secondary | ICD-10-CM | POA: Diagnosis not present

## 2018-02-23 DIAGNOSIS — G629 Polyneuropathy, unspecified: Secondary | ICD-10-CM | POA: Diagnosis not present

## 2018-02-23 DIAGNOSIS — R300 Dysuria: Secondary | ICD-10-CM | POA: Diagnosis not present

## 2018-03-16 DIAGNOSIS — G629 Polyneuropathy, unspecified: Secondary | ICD-10-CM | POA: Diagnosis not present

## 2018-03-16 DIAGNOSIS — M47816 Spondylosis without myelopathy or radiculopathy, lumbar region: Secondary | ICD-10-CM | POA: Diagnosis not present

## 2018-05-13 DIAGNOSIS — H04123 Dry eye syndrome of bilateral lacrimal glands: Secondary | ICD-10-CM | POA: Diagnosis not present

## 2018-05-19 ENCOUNTER — Other Ambulatory Visit: Payer: Self-pay | Admitting: Family Medicine

## 2018-05-19 DIAGNOSIS — Z1231 Encounter for screening mammogram for malignant neoplasm of breast: Secondary | ICD-10-CM

## 2018-06-06 DIAGNOSIS — H353130 Nonexudative age-related macular degeneration, bilateral, stage unspecified: Secondary | ICD-10-CM | POA: Diagnosis not present

## 2018-06-06 DIAGNOSIS — H2513 Age-related nuclear cataract, bilateral: Secondary | ICD-10-CM | POA: Diagnosis not present

## 2018-06-14 DIAGNOSIS — G629 Polyneuropathy, unspecified: Secondary | ICD-10-CM | POA: Diagnosis not present

## 2018-06-14 DIAGNOSIS — M47816 Spondylosis without myelopathy or radiculopathy, lumbar region: Secondary | ICD-10-CM | POA: Diagnosis not present

## 2018-06-22 ENCOUNTER — Ambulatory Visit
Admission: RE | Admit: 2018-06-22 | Discharge: 2018-06-22 | Disposition: A | Discharge status: Home / Self Care | Payer: Medicare Other | Source: Ambulatory Visit | Attending: Family Medicine | Admitting: Family Medicine

## 2018-06-22 DIAGNOSIS — Z1231 Encounter for screening mammogram for malignant neoplasm of breast: Secondary | ICD-10-CM

## 2018-06-23 ENCOUNTER — Other Ambulatory Visit: Payer: Self-pay | Admitting: Family Medicine

## 2018-06-23 DIAGNOSIS — N644 Mastodynia: Secondary | ICD-10-CM

## 2018-06-24 ENCOUNTER — Ambulatory Visit: Payer: Medicare Other | Admitting: Neurology

## 2018-06-27 ENCOUNTER — Ambulatory Visit (INDEPENDENT_AMBULATORY_CARE_PROVIDER_SITE_OTHER): Payer: Medicare Other | Admitting: Neurology

## 2018-06-27 ENCOUNTER — Encounter: Payer: Self-pay | Admitting: Neurology

## 2018-06-27 VITALS — BP 112/72 | HR 85 | Ht 64.0 in | Wt 143.0 lb

## 2018-06-27 DIAGNOSIS — G609 Hereditary and idiopathic neuropathy, unspecified: Secondary | ICD-10-CM

## 2018-06-27 DIAGNOSIS — E538 Deficiency of other specified B group vitamins: Secondary | ICD-10-CM

## 2018-06-27 MED ORDER — GABAPENTIN 600 MG PO TABS: 600.0000 mg | ORAL_TABLET | Freq: Three times a day (TID) | ORAL | 3 refills | Status: DC

## 2018-06-27 NOTE — Patient Instructions (Signed)
We will go up on the gabapentin to 600 mg three times a day.   Neurontin (gabapentin) may result in drowsiness, ankle swelling, gait instability, or possibly dizziness. Please contact our office if significant side effects occur with this medication.

## 2018-06-27 NOTE — Progress Notes (Signed)
Reason for visit: Back pain, painful feet  Referring physician: Dr. Christean Leaf is a 78 y.o. female  History of present illness:  Janet Giles is a 78 year old right-handed white female with a history of lumbosacral spine surgery in 2002 with surgery at the L4-5 level done by Dr. Carloyn Manner.  The patient initially did well with minimal back pain but within about 4 years following the surgery she began having burning sensations in the buttock area, she has been on hydrocodone twice daily for this pain over a number of years.  About 3 years ago, she began having some tingling sensations and numbness in the feet and she now has burning sensations.  The patient has some gait instability, she has not had any recent falls.  She does have a history of lupus and is followed through a rheumatologist for this.  She denies any numbness or tingling in the fingers, she has no problems controlling the bowels or the bladder.  She denies any significant neck pain.  She is currently on gabapentin taking 600 mg twice daily, she indicates that this does help her pain but does not fully control the discomfort.  She has not tried to go up to higher doses.  She comes to this office for an evaluation.  Past Medical History:  Diagnosis Date  . AAA (abdominal aortic aneurysm) ([place])   . Asthma   . Complication of anesthesia    trouble breathing after back surgery  . Degenerative joint disease of spine    buttocks pain -remains a problem  . Family history of adverse reaction to anesthesia    one sister nasea and vomiting  . GERD (gastroesophageal reflux disease)    occ  . Hyperlipidemia   . Hypothyroidism   . Lupus ([place])    Dr. Aura Fey  . Neuromuscular disorder ([facility])    neuropathy in feet  . PONV (postoperative nausea and vomiting)   . Shortness of breath dyspnea    uses inhaler occ    Past Surgical History:  Procedure Laterality Date  . ABDOMINAL HYSTERECTOMY    . BACK SURGERY  02     L4-L5- fusion  . COLONOSCOPY WITH PROPOFOL N/A 03/17/2016   Procedure: COLONOSCOPY WITH PROPOFOL;  Surgeon: Garlan Fair, MD;  Location: [facility];  Service: Endoscopy;  Laterality: N/A;  . ESOPHAGOGASTRODUODENOSCOPY (EGD) WITH PROPOFOL N/A 03/17/2016   Procedure: ESOPHAGOGASTRODUODENOSCOPY (EGD) WITH PROPOFOL;  Surgeon: Garlan Fair, MD;  Location: [facility];  Service: Endoscopy;  Laterality: N/A;  . JOINT REPLACEMENT    . TONSILLECTOMY AND ADENOIDECTOMY    . TOTAL HIP ARTHROPLASTY Right 03/25/2015   Procedure: TOTAL HIP ARTHROPLASTY ANTERIOR APPROACH;  Surgeon: Frederik Pear, MD;  Location: [facility];  Service: Orthopedics;  Laterality: Right;    Family History  Problem Relation Age of Onset  . Diabetes Mother   . Heart disease Mother   . Heart attack Mother   . Bleeding Disorder Father   . Heart disease Sister   . Heart attack Sister   . Cancer Sister   . Deep vein thrombosis Sister   . Diabetes Sister   . Hyperlipidemia Sister   . Hypertension Sister   . Varicose Veins Sister   . AAA (abdominal aortic aneurysm) Sister   . Bleeding Disorder Sister   . Breast cancer Sister   . Heart disease Brother   . Heart attack Brother   . Hypertension Brother     Social history:  reports that  she quit smoking about 18 years ago. Her smoking use included cigarettes. She has a 52.50 pack-year smoking history. She has never used smokeless tobacco. She reports that she does not drink alcohol or use drugs.  Medications:  Prior to Admission medications   Medication Sig Start Date End Date Taking? Authorizing Provider  albuterol (PROVENTIL HFA;VENTOLIN HFA) 108 (90 BASE) MCG/ACT inhaler Inhale 1 puff into the lungs every 6 (six) hours as needed for wheezing or shortness of breath.   Yes [provider]  Calcium Carbonate-Vitamin D (CALCIUM + D PO) Take 1 tablet by mouth 2 (two) times daily.   Yes [provider]  [employer] BIOTIN PO Take 1 tablet by mouth daily. 10000 mcg  daily bedtime   Yes [provider]  fluticasone (FLOVENT DISKUS) 50 MCG/BLIST diskus inhaler Inhale 1 puff into the lungs 2 (two) times daily.   Yes [provider]  HYDROcodone-acetaminophen (NORCO/VICODIN) 5-325 MG tablet Take 1 tablet by mouth every 12 (twelve) hours as needed for moderate pain.    Yes [provider]  levothyroxine (SYNTHROID, LEVOTHROID) 75 MCG tablet Take 1 tablet by mouth daily. 05/18/13  Yes [provider]  vitamin B-12 (CYANOCOBALAMIN) 1000 MCG tablet Take by mouth.   Yes [provider]  gabapentin (NEURONTIN) 300 MG capsule Take 1 capsule (300 mg total) by mouth 2 (two) times daily. Patient taking differently: Take 300 mg by mouth 3 (three) times daily.  03/19/16 02/14/18  Eliezer Lofts, PA-C      Allergies  Allergen Reactions  . Wasp Venom Shortness Of Breath and Swelling  . Doxycycline Nausea And Vomiting    ROS:  Out of a complete 14 system review of symptoms, the patient complains only of the following symptoms, and all other reviewed systems are negative.  Shortness of breath Joint pain, joint swelling Itching Skin sensitivity Numbness of the feet  Blood pressure 112/72, pulse 85, height 5\' 4"  (1.626 m), weight 143 lb (64.9 kg), SpO2 97 %.  Physical Exam  General: The patient is alert and cooperative at the time of the examination.  Eyes: Pupils are equal, round, and reactive to light. Discs are flat bilaterally.  Neck: The neck is supple, no carotid bruits are noted.  Respiratory: The respiratory examination is clear.  Cardiovascular: The cardiovascular examination reveals a regular rate and rhythm, no obvious murmurs or rubs are noted.  Skin: Extremities are without significant edema.  Neurologic Exam  Mental status: The patient is alert and oriented x 3 at the time of the examination. The patient has apparent normal recent and remote memory, with an apparently normal attention span and  concentration ability.  Cranial nerves: Facial symmetry is present. There is good sensation of the face to pinprick and soft touch bilaterally. The strength of the facial muscles and the muscles to head turning and shoulder shrug are normal bilaterally. Speech is well enunciated, no aphasia or dysarthria is noted. Extraocular movements are full. Visual fields are full. The tongue is midline, and the patient has symmetric elevation of the soft palate. No obvious hearing deficits are noted.  Motor: The motor testing reveals 5 over 5 strength of all 4 extremities. Good symmetric motor tone is noted throughout.  Sensory: Sensory testing is intact to pinprick, soft touch, vibration sensation, and position sense on the upper 4 extremities.  With the lower extremities, there is a sensory pinprick deficit up to the knees bilaterally.  Vibration sensation is impaired in both feet, minimal decrease in position  sense is noted in both feet.  No evidence of extinction is noted.  Coordination: Cerebellar testing reveals good finger-nose-finger and heel-to-shin bilaterally.  Gait and station: Gait is normal. Tandem gait is slightly unsteady. Romberg is negative. No drift is seen.  Reflexes: Deep tendon reflexes are symmetric and normal bilaterally, with exception of depression of the ankle jerk reflexes bilaterally. Toes are downgoing bilaterally.   Assessment/Plan:  1.  History of lumbosacral spine surgery  2.  Bilateral foot discomfort, rule out peripheral neuropathy  3.  History of lupus  The patient has symptoms that are consistent with a peripheral neuropathy.  The patient does have lupus which can be an etiology of this.  The patient will be set up for nerve conduction studies on both legs and EMG on one leg.  Blood work will be done today.  She will follow-up here in 4 months, the gabapentin dose will be increased to 600 mg 3 times daily.  A prescription was sent in.  Janet Alexanders MD 06/27/2018  11:35 AM  [facility] [address] [place], [place] [address]  Phone [phone] Fax [phone]

## 2018-06-29 DIAGNOSIS — H2512 Age-related nuclear cataract, left eye: Secondary | ICD-10-CM | POA: Diagnosis not present

## 2018-06-29 DIAGNOSIS — H25812 Combined forms of age-related cataract, left eye: Secondary | ICD-10-CM | POA: Diagnosis not present

## 2018-06-29 LAB — MULTIPLE MYELOMA PANEL, SERUM
Albumin SerPl Elph-Mcnc: 3.9 g/dL (ref 2.9–4.4)
Albumin/Glob SerPl: 1.4 (ref 0.7–1.7)
Alpha 1: 0.2 g/dL (ref 0.0–0.4)
Alpha2 Glob SerPl Elph-Mcnc: 0.8 g/dL (ref 0.4–1.0)
B-Globulin SerPl Elph-Mcnc: 1 g/dL (ref 0.7–1.3)
Gamma Glob SerPl Elph-Mcnc: 1 g/dL (ref 0.4–1.8)
Globulin, Total: 3 g/dL (ref 2.2–3.9)
IgA/Immunoglobulin A, Serum: 253 mg/dL (ref 64–422)
IgG (Immunoglobin G), Serum: 1030 mg/dL (ref 700–1600)
IgM (Immunoglobulin M), Srm: 71 mg/dL (ref 26–217)
Total Protein: 6.9 g/dL (ref 6.0–8.5)

## 2018-06-29 LAB — COPPER, SERUM: Copper: 100 ug/dL (ref 72–166)

## 2018-06-29 LAB — ANGIOTENSIN CONVERTING ENZYME: Angio Convert Enzyme: 21 U/L (ref 14–82)

## 2018-06-29 LAB — B. BURGDORFI ANTIBODIES: Lyme IgG/IgM Ab: <0.91 {ISR} (ref 0.00–0.90)

## 2018-06-29 LAB — VITAMIN B12: Vitamin B-12: 1721 pg/mL — ABNORMAL HIGH (ref 232–1245)

## 2018-07-06 ENCOUNTER — Ambulatory Visit
Admission: RE | Admit: 2018-07-06 | Discharge: 2018-07-06 | Disposition: A | Discharge status: Home / Self Care | Payer: Medicare Other | Source: Ambulatory Visit | Attending: Family Medicine | Admitting: Family Medicine

## 2018-07-06 DIAGNOSIS — R928 Other abnormal and inconclusive findings on diagnostic imaging of breast: Secondary | ICD-10-CM | POA: Diagnosis not present

## 2018-07-06 DIAGNOSIS — N644 Mastodynia: Secondary | ICD-10-CM

## 2018-07-26 ENCOUNTER — Ambulatory Visit (INDEPENDENT_AMBULATORY_CARE_PROVIDER_SITE_OTHER): Payer: Medicare Other | Admitting: Neurology

## 2018-07-26 ENCOUNTER — Encounter: Payer: Self-pay | Admitting: Neurology

## 2018-07-26 ENCOUNTER — Other Ambulatory Visit: Payer: Self-pay

## 2018-07-26 DIAGNOSIS — G609 Hereditary and idiopathic neuropathy, unspecified: Secondary | ICD-10-CM

## 2018-07-26 MED ORDER — DULOXETINE HCL 30 MG PO CPEP: 30.0000 mg | ORAL_CAPSULE | Freq: Every day | ORAL | 3 refills | Status: DC

## 2018-07-26 NOTE — Progress Notes (Signed)
Please refer to EMG and nerve conduction procedure note.  

## 2018-07-26 NOTE — Progress Notes (Addendum)
The patient comes in for EMG nerve conduction study evaluation today.  Nerve conduction studies does not show evidence of a peripheral neuropathy, the patient could potentially have a small fiber neuropathy.  EMG of the right leg is unremarkable.  The patient is having ongoing discomfort throughout the day and night, she is not sleeping well.  She is on gabapentin and hydrocodone.  We will start low-dose Cymbalta, the patient will call for any dose adjustments.    [place]    Nerve / Sites Muscle Latency Ref. Amplitude Ref. Rel Amp Segments Distance Velocity Ref. Area    ms ms mV mV %  cm m/s m/s mVms  R Peroneal - EDB     Ankle EDB 5.2 ?6.5 5.8 ?2.0 100 Ankle - EDB 9   22.9     Fib head EDB 10.8  6.4  110 Fib head - Ankle 26 47 ?44 23.7     Pop fossa EDB 12.9  5.9  92.6 Pop fossa - Fib head 10 47 ?44 23.6     Acc Peron EDB 14.7  0.2  3.01 Pop fossa - Ankle    0.2         Acc Peron - Pop fossa      L Peroneal - EDB     Ankle EDB 4.3 ?6.5 6.0 ?2.0 100 Ankle - EDB 9   24.9     Fib head EDB 9.4  5.5  91.7 Fib head - Ankle 25 48 ?44 23.6     Pop fossa EDB 11.5  5.7  104 Pop fossa - Fib head 10 48 ?44 27.1         Pop fossa - Ankle      R Tibial - AH     Ankle AH 3.8 ?5.8 7.4 ?4.0 100 Ankle - AH 9   19.9     Pop fossa AH 11.6  5.7  76.6 Pop fossa - Ankle 34 44 ?41 18.1  L Tibial - AH     Ankle AH 4.3 ?5.8 7.0 ?4.0 100 Ankle - AH 9   22.4     Pop fossa AH 12.4  5.3  76.5 Pop fossa - Ankle 33 41 ?41 20.7             SNC    Nerve / Sites Rec. Site Peak Lat Ref.  Amp Ref. Segments Distance    ms ms V V  cm  R Sural - Ankle (Calf)     Calf Ankle 3.6 ?4.4 12 ?6 Calf - Ankle 14  L Sural - Ankle (Calf)     Calf Ankle 3.8 ?4.4 8 ?6 Calf - Ankle 14  R Superficial peroneal - Ankle     Lat leg Ankle 4.4 ?4.4 6 ?6 Lat leg - Ankle 14  L Superficial peroneal - Ankle     Lat leg Ankle 4.4 ?4.4 4 ?6 Lat leg - Ankle 14             F  Wave    Nerve F Lat Ref.   ms ms  R Tibial - AH 51.1 ?56.0  L  Tibial - AH 52.7 ?56.0

## 2018-07-26 NOTE — Procedures (Signed)
     HISTORY:  Janet Giles is a 78 year old patient with a history of prior lumbosacral spine surgery done in 2002.  The patient has had ongoing discomfort in the feet with numbness and tingling, she is unable to rest well at night because of the discomfort.  She is being evaluated for a possible neuropathy or a lumbosacral radiculopathy.  NERVE CONDUCTION STUDIES:  Nerve conduction studies were performed on both lower extremities. The distal motor latencies and motor amplitudes for the peroneal and posterior tibial nerves were within normal limits. The nerve conduction velocities for these nerves were also normal. The sensory latencies for the peroneal and sural nerves were within normal limits. The F wave latencies for the posterior tibial nerves were within normal limits.   EMG STUDIES:  EMG study was performed on the right lower extremity:  The tibialis anterior muscle reveals 2 to 4K motor units with full recruitment. No fibrillations or positive waves were seen. The peroneus tertius muscle reveals 2 to 4K motor units with full recruitment. No fibrillations or positive waves were seen. The medial gastrocnemius muscle reveals 1 to 3K motor units with full recruitment. No fibrillations or positive waves were seen. The vastus lateralis muscle reveals 2 to 4K motor units with full recruitment. No fibrillations or positive waves were seen. The iliopsoas muscle reveals 2 to 4K motor units with full recruitment. No fibrillations or positive waves were seen. The biceps femoris muscle (long head) reveals 2 to 4K motor units with full recruitment. No fibrillations or positive waves were seen. The lumbosacral paraspinal muscles were tested at 3 levels, and revealed no abnormalities of insertional activity at all 3 levels tested. There was good relaxation.   IMPRESSION:  Nerve conduction studies done on both lower extremities were within normal limits.  No evidence of a peripheral neuropathy  is seen.  A small fiber neuropathy may be missed by standard nerve conduction studies, however.  Clinical correlation is required.  EMG evaluation of the right lower extremity was unremarkable, no evidence of an overlying lumbosacral radiculopathy was seen.  Jill Alexanders MD 07/26/2018 9:21 AM  [facility] [address] [place], [place] [address]  Phone [phone] Fax [phone]

## 2018-08-17 ENCOUNTER — Ambulatory Visit: Payer: 59 | Admitting: Physician Assistant

## 2018-09-01 ENCOUNTER — Telehealth: Payer: Self-pay | Admitting: Neurology

## 2018-09-01 MED ORDER — HYDROCODONE-ACETAMINOPHEN 5-325 MG PO TABS: 1.0000 | ORAL_TABLET | Freq: Four times a day (QID) | ORAL | 0 refills | Status: DC | PRN

## 2018-09-01 NOTE — Telephone Encounter (Signed)
I called the patient.  The patient has been getting hydrocodone through Dr. Carloyn Manner for 2 years.  Dr. Carloyn Manner has now retired.  I will send in the prescription for the hydrocodone, she could not tolerate the Cymbalta, she does not wish to try another medication such as Keppra for the neuropathy pain.

## 2018-09-01 NOTE — Telephone Encounter (Signed)
Pt called in for a refill of HYDROcodone-acetaminophen (NORCO/VICODIN) 5-325 MG tablet to be sent to [facility]  - [place]  She also stated she is unable to take the Cymbalta 30mg  because it makes her nauseated

## 2018-09-01 NOTE — Telephone Encounter (Signed)
[place] drug registry verified. Last refill was 08/03/18 # 60 for a 8 day written by Jaynee Eagles NP.

## 2018-09-27 NOTE — Progress Notes (Signed)
Office Visit Note  Patient: Janet Giles             Date of Birth: 28-Dec-1940           MRN: [id number]             PCP: Alroy Dust, L.Marlou Sa, MD Referring: Alroy Dust, Carlean Jews.Marlou Sa, MD Visit Date: 10/11/2018 Occupation: @GUAROCC @  Subjective:  Right middle trigger finger   History of Present Illness: Janet Giles is a 78 y.o. female with history of autoimmune disease, osteoarthritis, DDD.  Patient denies any signs or symptoms of an autoimmune flare.  She has been off of Plaquenil since April 2018.  She denies any recent rashes or photosensitivity.  She continues to follow-up with Dr. Ubaldo Glassing on a yearly basis for skin checks.  She denies any sores in her mouth or nose.  She is very intermittent symptoms of Raynaud's in her feet but denies any ulcerations or signs of gangrene.  She has chronic eye dryness but no mouth dryness.  She has been using eyedrops on a daily basis.  She has intermittent discomfort in bilateral hands but denies any joint swelling.  She has left CMC joint pain and is severe at times.  She has not been wearing the left [facility] joint brace recently.  She also is a right middle trigger finger and would like to schedule a cortisone injection in the future.  She is chronic pain in both feet which she attributes to peripheral neuropathy.  She has been following up with Dr. Jannifer Franklin on a regular basis.  Her right hip replacement is doing well.  She has intermittent lower back discomfort.  Denies any symptoms of radiculopathy   Activities of Daily Living:  Patient reports morning stiffness for several hours.   Patient Reports nocturnal pain.  Difficulty dressing/grooming: Denies Difficulty climbing stairs: Denies Difficulty getting out of chair: Denies Difficulty using hands for taps, buttons, cutlery, and/or writing: Reports  Review of Systems  Constitutional: Negative for fatigue.  HENT: Negative for mouth sores, mouth dryness and nose dryness.   Eyes: Negative for  pain, itching, visual disturbance and dryness.  Respiratory: Negative for cough, hemoptysis, shortness of breath, wheezing and difficulty breathing.   Cardiovascular: Negative for chest pain, palpitations, hypertension and swelling in legs/feet.  Gastrointestinal: Negative for abdominal pain, blood in stool, constipation and diarrhea.  Endocrine: Negative for increased urination.  Genitourinary: Negative for painful urination and pelvic pain.  Musculoskeletal: Positive for arthralgias, joint pain, joint swelling and morning stiffness. Negative for myalgias, muscle weakness, muscle tenderness and myalgias.  Skin: Negative for color change, pallor, rash, hair loss, nodules/bumps, redness, skin tightness, ulcers and sensitivity to sunlight.  Allergic/Immunologic: Negative for susceptible to infections.  Neurological: Negative for dizziness, light-headedness, numbness, headaches, memory loss and weakness.  Hematological: Positive for bruising/bleeding tendency. Negative for swollen glands.  Psychiatric/Behavioral: Negative for depressed mood, confusion and sleep disturbance. The patient is not nervous/anxious.     PMFS History:  Patient Active Problem List   Diagnosis Date Noted  . 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 Idiopathic peripheral neuropathy 08/13/2016  . 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 Former smoker 08/13/2016  . ANA positive 08/06/2016  . Raynaud's disease without gangrene 08/06/2016  . Primary osteoarthritis of both hands 08/06/2016  . Primary osteoarthritis of both feet 08/06/2016  . High risk medication use 08/06/2016  . Primary osteoarthritis of right hip 03/24/2015  . 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 AAA (abdominal aortic aneurysm) ([place])   . Asthma   . Complication of anesthesia    trouble breathing after back surgery  . Degenerative joint disease of spine    buttocks pain -remains a problem  .  Family history of adverse reaction to anesthesia    one sister nasea and vomiting  . GERD (gastroesophageal reflux disease)    occ  . Hyperlipidemia   . Hypothyroidism   . Lupus ([place])    Dr. Aura Giles  . Neuromuscular disorder ([facility])    neuropathy in feet  . PONV (postoperative nausea and vomiting)   . Shortness of breath dyspnea    uses inhaler occ    Family History  Problem Relation Age of Onset  . Diabetes Mother   . Heart disease Mother   . Heart attack Mother   . Bleeding Disorder Father   . Heart disease Sister   . Heart attack Sister   . Cancer Sister   . Deep vein thrombosis Sister   . Diabetes Sister   . Hyperlipidemia Sister   . Hypertension Sister   . Varicose Veins Sister   . AAA (abdominal aortic aneurysm) Sister   . Bleeding Disorder Sister   . Breast cancer Sister   . Heart disease Brother   . Heart attack Brother   . Hypertension Brother    Past Surgical History:  Procedure Laterality Date  . ABDOMINAL HYSTERECTOMY    . BACK SURGERY  02   L4-L5- fusion  . COLONOSCOPY WITH PROPOFOL N/A 03/17/2016   Procedure: COLONOSCOPY WITH PROPOFOL;  Surgeon: Garlan Fair, MD;  Location: [facility];  Service: Endoscopy;  Laterality: N/A;  . ESOPHAGOGASTRODUODENOSCOPY (EGD) WITH PROPOFOL N/A 03/17/2016   Procedure: ESOPHAGOGASTRODUODENOSCOPY (EGD) WITH PROPOFOL;  Surgeon: Garlan Fair, MD;  Location: [facility];  Service: Endoscopy;  Laterality: N/A;  . JOINT REPLACEMENT    . TONSILLECTOMY AND ADENOIDECTOMY    . TOTAL HIP ARTHROPLASTY Right 03/25/2015   Procedure: TOTAL HIP ARTHROPLASTY ANTERIOR APPROACH;  Surgeon: Frederik Pear, MD;  Location: [place];  Service: Orthopedics;  Laterality: Right;   Social History   Social History Narrative   Right handed   2 cups of caffeine daily   Lives at home with husband and granddaughter     There is no immunization history on file for this patient.   Objective: Vital Signs: BP 135/73 (BP Location:  Left Arm, Patient Position: Sitting, Cuff Size: Normal)   Pulse 74   Resp 12   Ht 5\' 4"  (5.093 m)   Wt 144 lb (65.3 kg)   BMI 24.72 kg/m    Physical Exam Vitals signs and nursing note reviewed.  Constitutional:      Appearance: She is well-developed.  HENT:     Head: Normocephalic and atraumatic.  Eyes:     Conjunctiva/sclera: Conjunctivae normal.  Neck:     Musculoskeletal: Normal range of motion.  Cardiovascular:     Rate and Rhythm: Normal rate and regular rhythm.     Heart sounds: Normal heart sounds.  Pulmonary:     Effort: Pulmonary effort is normal.     Breath sounds: Normal breath sounds.  Abdominal:     General: Bowel sounds are normal.     Palpations: Abdomen is soft.  Lymphadenopathy:     Cervical: No cervical adenopathy.  Skin:    General: Skin is warm and dry.     Capillary Refill: Capillary refill takes less than 2 seconds.  Neurological:  Mental Status: She is alert and oriented to person, place, and time.  Psychiatric:        Behavior: Behavior normal.      Musculoskeletal Exam: C-spine, thoracic spine, lumbar spine good range of motion.  No midline spinal tenderness.  No SI joint tenderness.  Shoulder joints, elbow joints, wrist joints, MCPs, PIPs, DIPs good range of motion no synovitis.  PIP and DIP synovial thickening.  Bilateral CMC joint synovial thickening. Right middle trigger finger.  Hip joints, knee joints, ankle joints, MTPs, PIPs, DIPs good range of motion no synovitis.  No warmth or effusion bilateral knee joints.  No tenderness or swelling of ankle joints.  CDAI Exam: CDAI Score: Not documented Patient Global Assessment: Not documented; Provider Global Assessment: Not documented Swollen: Not documented; Tender: Not documented Joint Exam   Not documented   There is currently no information documented on the homunculus. Go to the Rheumatology activity and complete the homunculus joint exam.  Investigation: No additional findings.   Imaging: No results found.  Recent Labs: Lab Results  Component Value Date   WBC 6.4 02/14/2018   HGB 10.9 (L) 02/14/2018   PLT 353 02/14/2018   NA 137 02/14/2018   K 5.3 02/14/2018   CL 101 02/14/2018   CO2 31 02/14/2018   GLUCOSE 70 02/14/2018   BUN 20 02/14/2018   CREATININE 0.81 02/14/2018   BILITOT 0.6 02/14/2018   ALKPHOS 47 08/17/2016   AST 17 02/14/2018   ALT 13 02/14/2018   PROT 6.9 06/27/2018   ALBUMIN 4.3 08/17/2016   CALCIUM 10.6 (H) 02/14/2018   GFRAA 81 02/14/2018    Speciality Comments: No specialty comments available.  Procedures:  No procedures performed Allergies: Wasp venom and Doxycycline   Assessment / Plan:     Visit Diagnoses: Autoimmune disease ([place]) - +ANA 1:40, ds DNA -, Raynauds , history of skin lupus biopsy proven by Dr. Ubaldo Glassing per patient. -Patient has been off Plaquenil since April 2018: She denies any recent signs or symptoms of a flare.  She has been off of Plaquenil since 2018 and has not noticed any new or worsening symptoms.  She has no synovitis on exam.  She has no oral or nasal ulcerations.  She continues have eye dryness is been used eyedrops on a daily basis.  She has no mouth dryness.  She denied any recent rashes or photosensitivity.  She continues to follow-up with Dr. Ubaldo Glassing on a regular basis.  She has not had any shortness of breath or palpitations recently.  She has intermittent symptoms of Raynaud's in bilateral feet but no ulcerations or signs of gangrene were noted.  She was advised to notify us if she develops any new or worsening symptoms.  She will follow-up in the office in 1 year.  High risk medication use: She has been off of Plaquenil since April 2018.  Raynaud's disease without gangrene: She experiences intermittent symptoms of Raynaud's in bilateral feet.  No ulcerations or signs of gangrene were noted.  She was encouraged to wear thick socks and keep her core body temperature warm.  Primary osteoarthritis of both  hands: She has PIP and DIP synovial thickening consistent with osteoarthritis of bilateral hands.  She has bilateral CMC joint pain and synovial thickening.  She has not been wearing a CMC joint brace recently.  We discussed the use of arthritis gloves at night.  Joint protection and muscle strengthening were discussed.  Primary osteoarthritis of both feet: She has chronic pain in both  feet.  She has no joint swelling.  She has been experiencing severe discomfort due to symptoms of neuropathy.    History of total hip replacement, right: Doing well.  She has good ROM on exam with no discomfort.   DDD (degenerative disc disease), lumbar: She has intermittent lower back pain.    Trigger middle finger of right hand: She will schedule an ultrasound guided cortisone injection.  Idiopathic peripheral neuropathy: She had a NCV with EMG on 07/26/18 which was WNL.  She continues to follow up with Dr. Jannifer Franklin.   Other fatigue: Chronic but stable.   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  No orders of the defined types were placed in this encounter.    Follow-Up Instructions: Return in about 1 year (around 10/11/2019) for Autoimmune Disease.   Ofilia Neas, PA-C  Note - This record has been created using Dragon software.  Chart creation errors have been sought, but may not always  have been located. Such creation errors do not reflect on  the standard of medical care.

## 2018-10-05 ENCOUNTER — Telehealth: Payer: Self-pay | Admitting: Neurology

## 2018-10-05 MED ORDER — HYDROCODONE-ACETAMINOPHEN 5-325 MG PO TABS: 1.0000 | ORAL_TABLET | Freq: Four times a day (QID) | ORAL | 0 refills | Status: DC | PRN

## 2018-10-05 NOTE — Telephone Encounter (Signed)
The prescription for the hydrocodone will be sent in.

## 2018-10-05 NOTE — Telephone Encounter (Signed)
Pt is needing a refill on her HYDROcodone-acetaminophen (NORCO/VICODIN) 5-325 MG tablet sent to the [facility] on [address]

## 2018-10-05 NOTE — Telephone Encounter (Signed)
[place] drug registry verified. Rx was last refilled by Dr. Jannifer Franklin on 09/01/18 # 60 for a 28 day supply.

## 2018-10-11 ENCOUNTER — Ambulatory Visit (INDEPENDENT_AMBULATORY_CARE_PROVIDER_SITE_OTHER): Payer: Medicare Other | Admitting: Physician Assistant

## 2018-10-11 ENCOUNTER — Other Ambulatory Visit: Payer: Self-pay

## 2018-10-11 ENCOUNTER — Encounter: Payer: Self-pay | Admitting: Physician Assistant

## 2018-10-11 VITALS — BP 135/73 | HR 74 | Resp 12 | Ht 64.0 in | Wt 144.0 lb

## 2018-10-11 DIAGNOSIS — I77811 Abdominal aortic ectasia: Secondary | ICD-10-CM | POA: Diagnosis not present

## 2018-10-11 DIAGNOSIS — Z96641 Presence of right artificial hip joint: Secondary | ICD-10-CM

## 2018-10-11 DIAGNOSIS — I7300 Raynaud's syndrome without gangrene: Secondary | ICD-10-CM | POA: Diagnosis not present

## 2018-10-11 DIAGNOSIS — M65331 Trigger finger, right middle finger: Secondary | ICD-10-CM | POA: Diagnosis not present

## 2018-10-11 DIAGNOSIS — Z79899 Other long term (current) drug therapy: Secondary | ICD-10-CM

## 2018-10-11 DIAGNOSIS — M359 Systemic involvement of connective tissue, unspecified: Secondary | ICD-10-CM

## 2018-10-11 DIAGNOSIS — M5136 Other intervertebral disc degeneration, lumbar region: Secondary | ICD-10-CM | POA: Diagnosis not present

## 2018-10-11 DIAGNOSIS — G609 Hereditary and idiopathic neuropathy, unspecified: Secondary | ICD-10-CM

## 2018-10-11 DIAGNOSIS — R5383 Other fatigue: Secondary | ICD-10-CM | POA: Diagnosis not present

## 2018-10-11 DIAGNOSIS — M19071 Primary osteoarthritis, right ankle and foot: Secondary | ICD-10-CM

## 2018-10-11 DIAGNOSIS — M19041 Primary osteoarthritis, right hand: Secondary | ICD-10-CM | POA: Diagnosis not present

## 2018-10-11 DIAGNOSIS — Z87891 Personal history of nicotine dependence: Secondary | ICD-10-CM

## 2018-10-11 DIAGNOSIS — M19072 Primary osteoarthritis, left ankle and foot: Secondary | ICD-10-CM

## 2018-10-11 DIAGNOSIS — M19042 Primary osteoarthritis, left hand: Secondary | ICD-10-CM

## 2018-10-13 IMAGING — MR MR HIP*R* W/O CM
5 of 6 series · 30 of 40 positions shown · non-contrast
Comparison: MR arthrogram right hip 08/03/2007.

CLINICAL DATA: Right hip and groin pain for 9 months. History of
right hip replacement in March 2015.

EXAM:
MR OF THE RIGHT HIP WITHOUT CONTRAST
TECHNIQUE: Multiplanar, multisequence MR imaging was performed. No intravenous
contrast was administered.

[Series 4: T1 · coronal · 4.0mm · 1.25mm/px · 5 of 21 slices shown (1 of 2)]
[im 1/21]
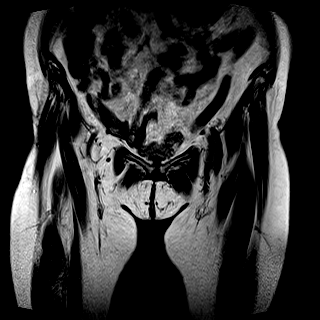
[im 6/21]
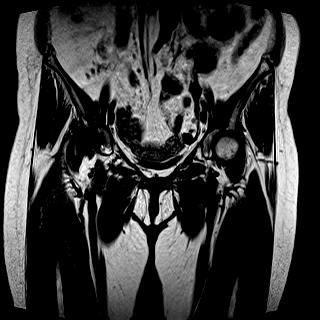
[im 11/21]
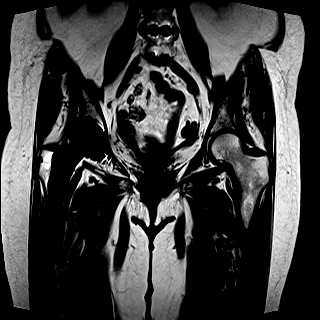
[im 16/21]
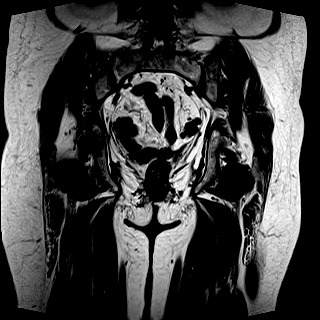
[im 21/21]
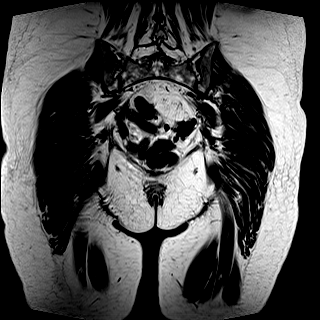

[Series 5: STIR · coronal · 4.0mm · 1.56mm/px · 4 of 21 slices shown (1 of 2)]
[im 1/21]
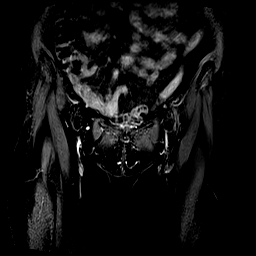
[im 7/21]
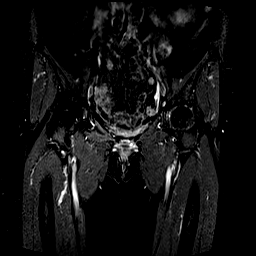
[im 14/21]
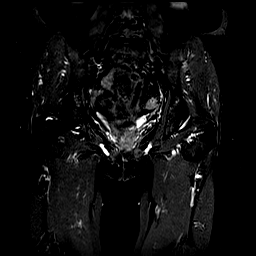
[im 21/21]
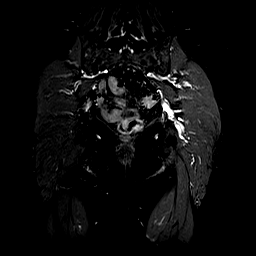

[Series 6: T1 · axial · 4.0mm · 0.74mm/px · z∈[-218,+27]mm · 9 of 52 slices shown (2 of 2)]
[im 1/52]
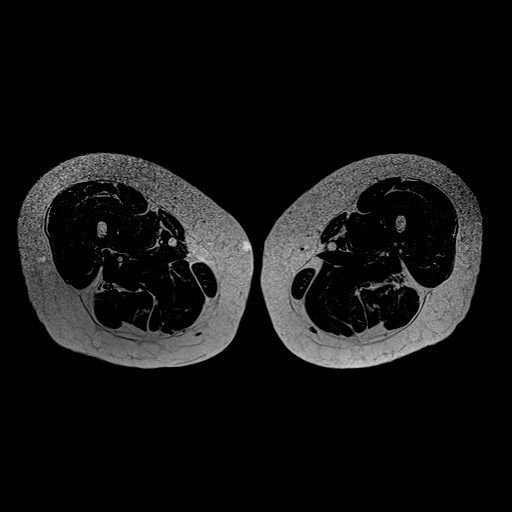
[im 7/52]
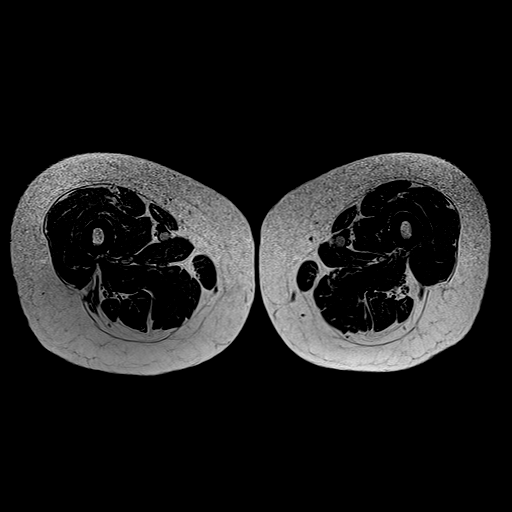
[im 13/52]
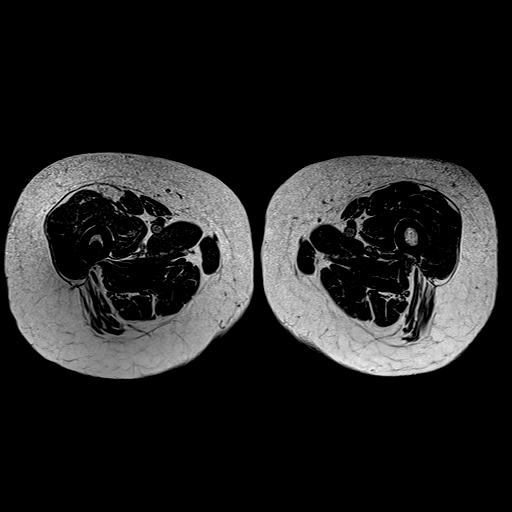
[im 20/52]
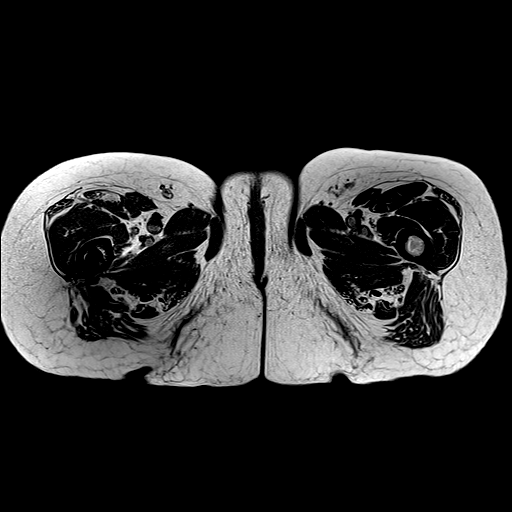
[im 26/52]
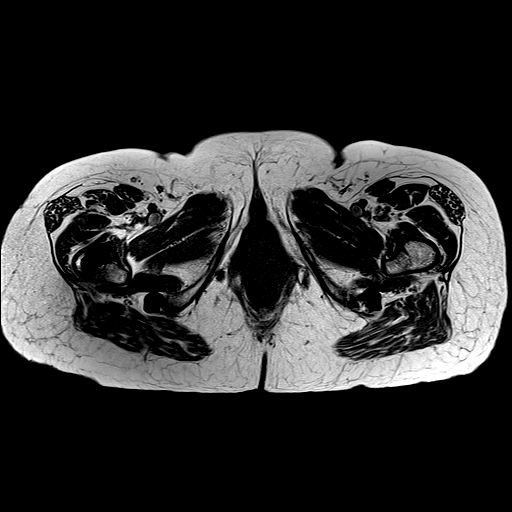
[im 32/52]
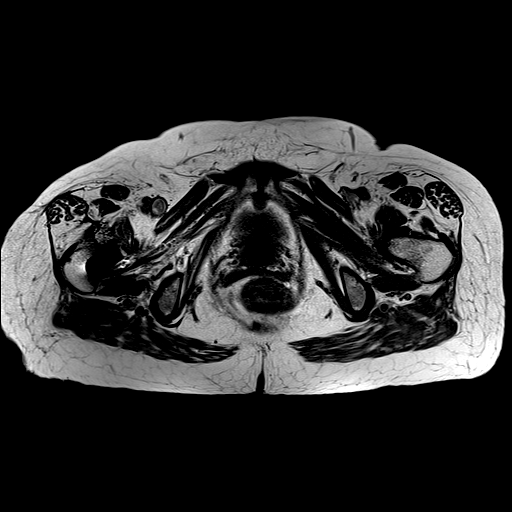
[im 39/52]
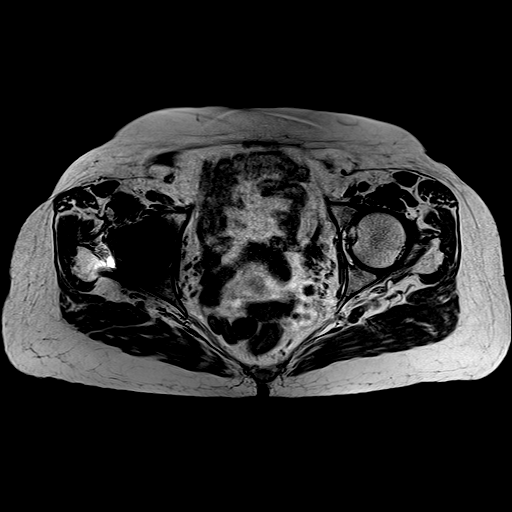
[im 45/52]
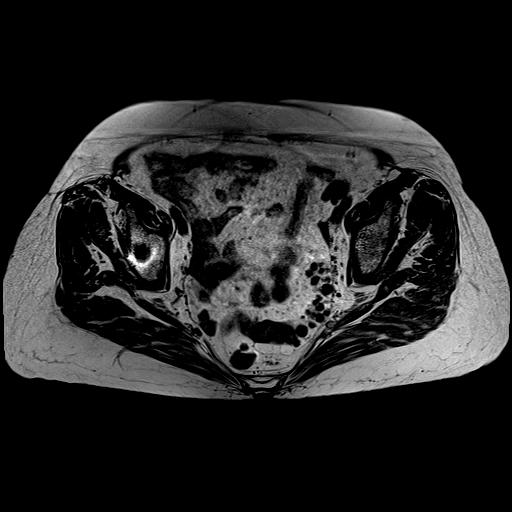
[im 52/52]
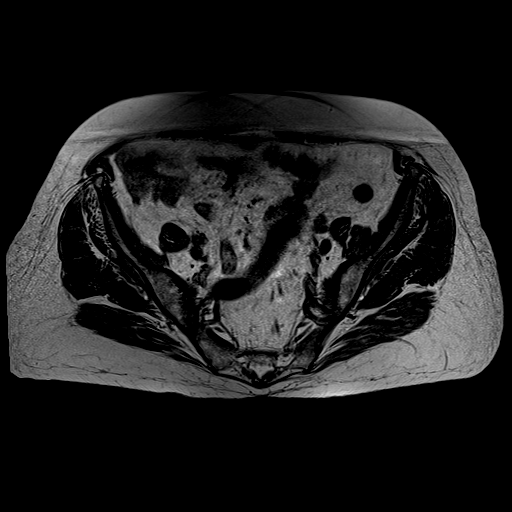

[Series 7: T2 · axial · 4.0mm · 0.99mm/px · z∈[-218,+27]mm · 9 of 52 slices shown]
[im 1/52]
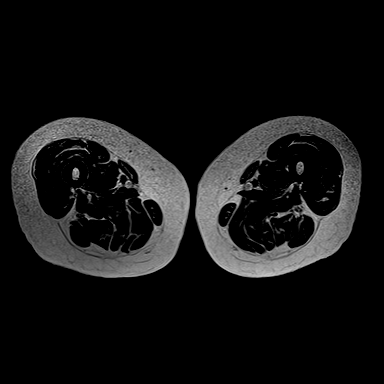
[im 7/52]
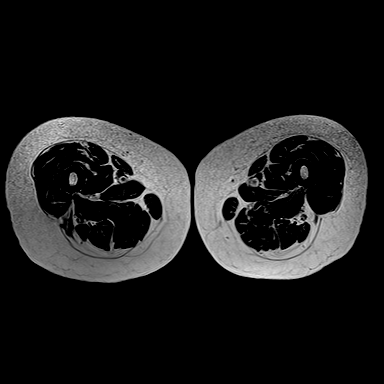
[im 13/52]
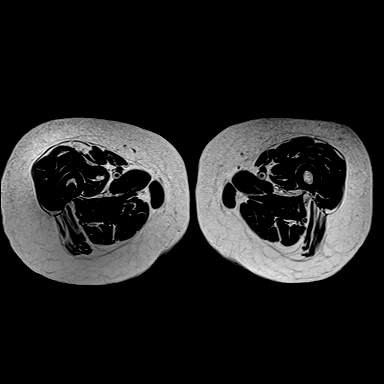
[im 20/52]
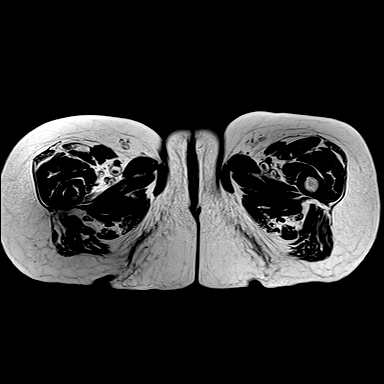
[im 26/52]
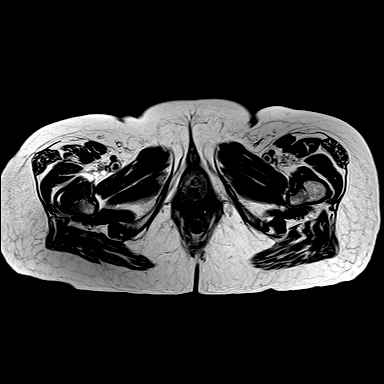
[im 32/52]
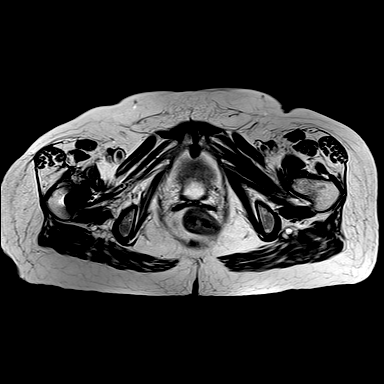
[im 39/52]
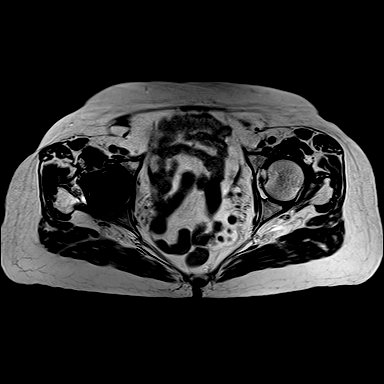
[im 45/52]
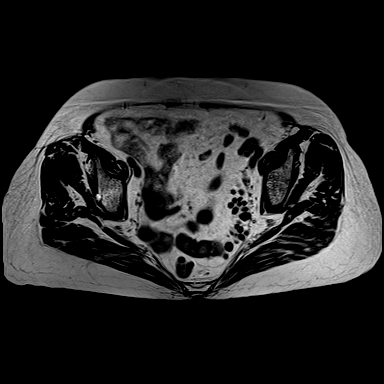
[im 52/52]
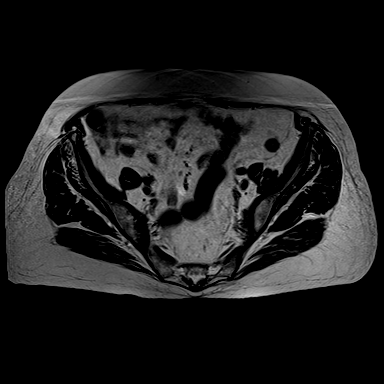

[Series 8: STIR · axial · 4.0mm · 0.99mm/px · z∈[-218,-160]mm · 3 of 52 slices shown (2 of 2)]
[im 1/52]
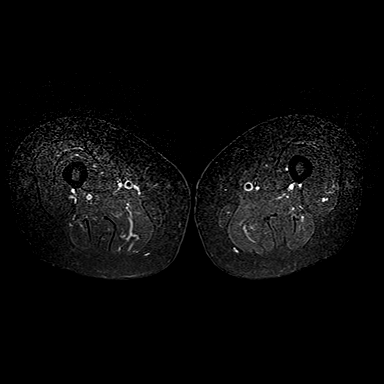
[im 7/52]
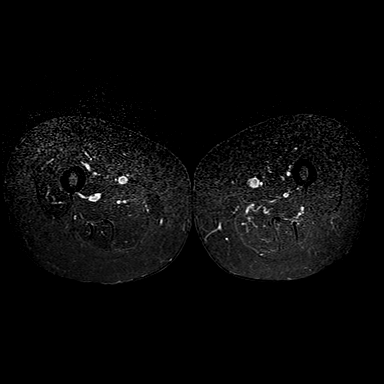
[im 13/52]
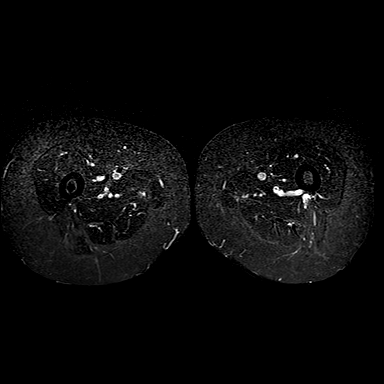

[30 of 40 positions shown; findings below may reference images not displayed]

FINDINGS: Bones: Artifact reduction techniques were used in this patient with
a right total hip arthroplasty. Bone marrow signal is normal
throughout without fracture, stress change or focal lesion. No
erosive change is identified about the patient's right hip
arthroplasty. No soft tissue mass is seen about the device. The
patient is status post lower lumbar fusion.

Articular cartilage and labrum

Articular cartilage:  Mildly degenerated on left.

Labrum:  Appears intact on the left.

Joint or bursal effusion

Joint effusion:  None.

Bursae:  Negative.

Muscles and tendons

Muscles and tendons:  Intact.

Other findings

Miscellaneous: Sigmoid diverticulosis is noted. The patient is
status post hysterectomy.
IMPRESSION: Right total hip arthroplasty in place without evidence complication.
No acute abnormality or finding to explain the patient's symptoms.

Mild appearing left hip osteoarthritis.

Diverticulosis without diverticulitis.

## 2018-10-19 ENCOUNTER — Telehealth: Payer: Self-pay | Admitting: Neurology

## 2018-10-19 NOTE — Telephone Encounter (Signed)
Due to current COVID 19 pandemic, our office is severely reducing in office visits until further notice, in order to minimize the risk to our patients and healthcare providers.   Called patient to offer a virtual visit for her 6/15 appointment. Patient accepted and verbalized understanding of the doxy process. I have sent pt an e-mail with link and directions and my office contact info for reference.  Pt understands that although there may be some limitations with this type of visit, we will take all precautions to reduce any security or privacy concerns.  Pt understands that this will be treated like an in office visit and we will file with pt's insurance, and there may be a patient responsible charge related to this service.

## 2018-10-20 NOTE — Telephone Encounter (Signed)
Attempted reach the pt and complete the pre charting for 10/24/18 vv. Pt was not available and vm was not set up.

## 2018-10-24 ENCOUNTER — Ambulatory Visit (INDEPENDENT_AMBULATORY_CARE_PROVIDER_SITE_OTHER): Payer: Medicare Other | Admitting: Neurology

## 2018-10-24 ENCOUNTER — Other Ambulatory Visit: Payer: Self-pay

## 2018-10-24 ENCOUNTER — Encounter: Payer: Self-pay | Admitting: Neurology

## 2018-10-24 DIAGNOSIS — G609 Hereditary and idiopathic neuropathy, unspecified: Secondary | ICD-10-CM

## 2018-10-24 NOTE — Progress Notes (Signed)
     Virtual Visit via Video Note  I connected with Janet Giles on 10/24/18 at 11:30 AM EDT by a video enabled telemedicine application and verified that I am speaking with the correct person using two identifiers.  Location: Patient: The patient is at home. Provider: Physician in office.   I discussed the limitations of evaluation and management by telemedicine and the availability of in person appointments. The patient expressed understanding and agreed to proceed.  History of Present Illness: Janet Giles is a 78 year old right-handed white female with a history of lupus and history of prior lumbosacral spine surgery at the L4-5 levels.  The patient has what is felt to be a neuropathy as she has numbness and burning sensations in the ball the foot and in the toes, she feels as if there is something underneath her feet when she is walking.  She gains benefit from gabapentin, she currently is on 600 mg 3 times daily.  She was given a trial on Cymbalta but did not tolerate the drug.  She wakes up to 3 times at night, usually she can get back to sleep but not always.  She denies any severe balance problems, she has not had any falls.  A recent nerve conduction study did not confirm the presence of a peripheral neuropathy.  It is possible that the patient may have a small fiber neuropathy.  She does have some discomfort into the buttocks area from time to time, she takes hydrocodone for this.  She is being reevaluated for the above issue.   Observations/Objective: The virtual visit evaluation reveals that the patient is alert and cooperative.  Speech is well enunciated, not aphasic or dysarthric.  Extract movements are full.  The patient has the ability to protrude the tongue in the midline with good lateral movement the tongue.  She has good finger-nose-finger and heel shin bilaterally.  Gait is relatively unremarkable, she is able to perform tandem gait but this is slightly unsteady.   Romberg is negative, no drift is seen.  Assessment and Plan: 1.  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  History of prior lumbosacral spine surgery  The patient will continue on the gabapentin, she will contact our office for any dose adjustments.  In the future if she needs another medication for pain we can consider carbamazepine or Keppra.  The patient will follow-up through this office in about 6 months.  She is on hydrocodone if needed for pain, this originally was prescribed through Dr. Carloyn Manner her neurosurgeon but our office has now picked up this responsibility.  I indicated that she may require a pain center referral, but she is not interested in this referral at this time.  Follow Up Instructions: 41-month follow-up, may see nurse practitioner.   I discussed the assessment and treatment plan with the patient. The patient was provided an opportunity to ask questions and all were answered. The patient agreed with the plan and demonstrated an understanding of the instructions.   The patient was advised to call back or seek an in-person evaluation if the symptoms worsen or if the condition fails to improve as anticipated.  I provided 15 minutes of non-face-to-face time during this encounter.   Kathrynn Ducking, MD

## 2018-10-25 DIAGNOSIS — L298 Other pruritus: Secondary | ICD-10-CM | POA: Diagnosis not present

## 2018-10-25 DIAGNOSIS — L821 Other seborrheic keratosis: Secondary | ICD-10-CM | POA: Diagnosis not present

## 2018-10-25 DIAGNOSIS — L570 Actinic keratosis: Secondary | ICD-10-CM | POA: Diagnosis not present

## 2018-10-25 DIAGNOSIS — L814 Other melanin hyperpigmentation: Secondary | ICD-10-CM | POA: Diagnosis not present

## 2018-10-25 DIAGNOSIS — D1801 Hemangioma of skin and subcutaneous tissue: Secondary | ICD-10-CM | POA: Diagnosis not present

## 2018-11-07 ENCOUNTER — Telehealth: Payer: Self-pay | Admitting: Neurology

## 2018-11-07 MED ORDER — HYDROCODONE-ACETAMINOPHEN 5-325 MG PO TABS: 1.0000 | ORAL_TABLET | Freq: Four times a day (QID) | ORAL | 0 refills | Status: DC | PRN

## 2018-11-07 NOTE — Telephone Encounter (Signed)
The hydrocodone prescription will be sent in.

## 2018-11-07 NOTE — Addendum Note (Signed)
Addended by: Kathrynn Ducking on: 11/07/2018 03:22 PM   Modules accepted: Orders

## 2018-11-07 NOTE — Telephone Encounter (Signed)
Pt has called for a refill on her HYDROcodone-acetaminophen (NORCO/VICODIN) 5-325 MG tablet  [place] [phone]

## 2018-11-07 NOTE — Telephone Encounter (Signed)
[place] drug registry verified. Last refill was 10/05/18 # 60 for a 28 day supply provided by Dr. Jannifer Franklin.

## 2018-12-06 ENCOUNTER — Telehealth: Payer: Self-pay | Admitting: Neurology

## 2018-12-06 MED ORDER — HYDROCODONE-ACETAMINOPHEN 5-325 MG PO TABS: 1.0000 | ORAL_TABLET | Freq: Four times a day (QID) | ORAL | 0 refills | Status: DC | PRN

## 2018-12-06 NOTE — Telephone Encounter (Signed)
The hydrocodone will be refilled. 

## 2018-12-06 NOTE — Telephone Encounter (Signed)
[place] drug registry verified. Last refill was 11/07/2018 # 60 for a 28 day supply by Dr. Jannifer Franklin.

## 2018-12-06 NOTE — Addendum Note (Signed)
Addended by: Kathrynn Ducking on: 12/06/2018 04:50 PM   Modules accepted: Orders

## 2018-12-06 NOTE — Telephone Encounter (Signed)
Pt is asking if she can get a refill on her HYDROcodone-acetaminophen (NORCO/VICODIN) 5-325 MG tablet  [place]

## 2019-01-04 ENCOUNTER — Other Ambulatory Visit: Payer: Self-pay | Admitting: Neurology

## 2019-01-04 MED ORDER — HYDROCODONE-ACETAMINOPHEN 5-325 MG PO TABS: 1.0000 | ORAL_TABLET | Freq: Four times a day (QID) | ORAL | 0 refills | Status: DC | PRN

## 2019-01-04 NOTE — Telephone Encounter (Signed)
Pt is asking for a refill HYDROcodone-acetaminophen (NORCO/VICODIN) 5-325 MG tablet  [place] [phone]

## 2019-01-04 NOTE — Addendum Note (Signed)
Addended by: Minna Antis on: 01/04/2019 04:48 PM   Modules accepted: Orders

## 2019-02-03 ENCOUNTER — Telehealth: Payer: Self-pay | Admitting: Neurology

## 2019-02-03 NOTE — Telephone Encounter (Signed)
Pt is needing a refill on her HYDROcodone-acetaminophen (NORCO/VICODIN) 5-325 MG tablet sent to the [facility] on [address]

## 2019-02-06 MED ORDER — HYDROCODONE-ACETAMINOPHEN 5-325 MG PO TABS: 1.0000 | ORAL_TABLET | Freq: Four times a day (QID) | ORAL | 0 refills | Status: DC | PRN

## 2019-02-06 NOTE — Telephone Encounter (Signed)
The [place] registry was checked, the hydrocodone prescription was sent in. 

## 2019-02-06 NOTE — Telephone Encounter (Signed)
[place] drug registry has been verified. Last refill for Hydrocodone was 01/05/2019 # 60 by Dr. Jannifer Franklin for a 15 day supply.

## 2019-02-14 ENCOUNTER — Other Ambulatory Visit: Payer: Self-pay | Admitting: Neurology

## 2019-02-17 DIAGNOSIS — Z03818 Encounter for observation for suspected exposure to other biological agents ruled out: Secondary | ICD-10-CM | POA: Diagnosis not present

## 2019-03-07 ENCOUNTER — Telehealth: Payer: Self-pay | Admitting: Neurology

## 2019-03-07 MED ORDER — HYDROCODONE-ACETAMINOPHEN 5-325 MG PO TABS: 1.0000 | ORAL_TABLET | Freq: Four times a day (QID) | ORAL | 0 refills | Status: DC | PRN

## 2019-03-07 NOTE — Telephone Encounter (Signed)
The hydrocodone prescription will be refilled. 

## 2019-03-07 NOTE — Telephone Encounter (Signed)
 drug registry verified. Last refill was # 02/06/2019 # 60 for a 28 day supply.

## 2019-03-07 NOTE — Addendum Note (Signed)
Addended by: Kathrynn Ducking on: 03/07/2019 04:27 PM   Modules accepted: Orders

## 2019-03-07 NOTE — Telephone Encounter (Signed)
Pt has called for a refill on her HYDROcodone-acetaminophen (NORCO/VICODIN) 5-325 MG tablet ° °[id number] °

## 2019-03-16 ENCOUNTER — Ambulatory Visit
Admission: EM | Admit: 2019-03-16 | Discharge: 2019-03-16 | Disposition: A | Discharge status: Home / Self Care | Payer: Medicare Other | Attending: Physician Assistant | Admitting: Physician Assistant

## 2019-03-16 DIAGNOSIS — W208XXA Other cause of strike by thrown, projected or falling object, initial encounter: Secondary | ICD-10-CM | POA: Diagnosis not present

## 2019-03-16 DIAGNOSIS — Y93H2 Activity, gardening and landscaping: Secondary | ICD-10-CM | POA: Diagnosis not present

## 2019-03-16 DIAGNOSIS — S81812A Laceration without foreign body, left lower leg, initial encounter: Secondary | ICD-10-CM

## 2019-03-16 DIAGNOSIS — S8012XA Contusion of left lower leg, initial encounter: Secondary | ICD-10-CM

## 2019-03-16 MED ORDER — LIDOCAINE-EPINEPHRINE-TETRACAINE (LET) TOPICAL GEL
3.0000 mL | Freq: Once | TOPICAL | Status: AC
  Administered 2019-03-16: 3 mL via TOPICAL

## 2019-03-16 NOTE — Discharge Instructions (Signed)
Tetanus up-to-date, did not need one today.  We applied for 4 dermaclip, 3 Steri-Strips, this will fall off on in about 10 days.  Avoid getting area wet for the next 10 days.  Keep area clean and dry.  Monitor for signs of infection such as redness, warmth, increased pain, follow-up for reevaluation.

## 2019-03-16 NOTE — ED Notes (Signed)
Non adhesive dressing and coban placed to right lower leg. Wound care instructions given.

## 2019-03-16 NOTE — ED Triage Notes (Signed)
Pt states pulling a tree branch and a limb dropped on her left leg. Lac/skin tear to lt lower leg. Bleeding controlled.

## 2019-03-16 NOTE — ED Provider Notes (Addendum)
[name]    CSN: [id number] Arrival date & time: 03/16/19  1242      History   Chief Complaint Chief Complaint  Patient presents with  . Laceration    HPI Janet Giles is a 78 y.o. female.   78 year old female comes in for left lower leg abrasion/lacerations after injury earlier today. Patient was pulling a tree branch when limb dropped and scraped her left lower leg. Bleeding controlled with pressure. Janet Giles has still been able to walk without difficulty. Dressed wound and came in for evaluation. Tetanus up to date.      Past Medical History:  Diagnosis Date  . AAA (abdominal aortic aneurysm) ([place])   . Asthma   . Complication of anesthesia    trouble breathing after back surgery  . Degenerative joint disease of spine    buttocks pain -remains a problem  . Family history of adverse reaction to anesthesia    one sister nasea and vomiting  . GERD (gastroesophageal reflux disease)    occ  . Hyperlipidemia   . Hypothyroidism   . Lupus ([place])    Dr. Aura Fey  . Neuromuscular disorder (HCC)    neuropathy in feet  . PONV (postoperative nausea and vomiting)   . Shortness of breath dyspnea    uses inhaler occ    Patient Active Problem List   Diagnosis Date Noted  . 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 Idiopathic peripheral neuropathy 08/13/2016  . 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 Former smoker 08/13/2016  . ANA positive 08/06/2016  . Raynaud's disease without gangrene 08/06/2016  . Primary osteoarthritis of both hands 08/06/2016  . Primary osteoarthritis of both feet 08/06/2016  . High risk medication use 08/06/2016  . Primary osteoarthritis of right hip 03/24/2015  . 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 ABDOMINAL HYSTERECTOMY    . BACK SURGERY  02   L4-L5- fusion  . COLONOSCOPY WITH PROPOFOL N/A 03/17/2016   Procedure: COLONOSCOPY WITH PROPOFOL;   Surgeon: Garlan Fair, MD;  Location: [facility];  Service: Endoscopy;  Laterality: N/A;  . ESOPHAGOGASTRODUODENOSCOPY (EGD) WITH PROPOFOL N/A 03/17/2016   Procedure: ESOPHAGOGASTRODUODENOSCOPY (EGD) WITH PROPOFOL;  Surgeon: Garlan Fair, MD;  Location: [facility];  Service: Endoscopy;  Laterality: N/A;  . JOINT REPLACEMENT    . TONSILLECTOMY AND ADENOIDECTOMY    . TOTAL HIP ARTHROPLASTY Right 03/25/2015   Procedure: TOTAL HIP ARTHROPLASTY ANTERIOR APPROACH;  Surgeon: Frederik Pear, MD;  Location: [facility];  Service: Orthopedics;  Laterality: Right;    OB History   No obstetric history on file.      Home Medications    Prior to Admission medications   Medication Sig Start Date End Date Taking? Authorizing Provider  albuterol (PROVENTIL HFA;VENTOLIN HFA) 108 (90 BASE) MCG/ACT inhaler Inhale 1 puff into the lungs every 6 (six) hours as needed for wheezing or shortness of breath.    [provider]  Calcium Carbonate-Vitamin D (CALCIUM + D PO) Take 1 tablet by mouth 2 (two) times daily.    [provider]  [employer] BIOTIN PO Take 1 tablet by mouth daily. 10000 mcg daily bedtime    [provider]  fluticasone (FLOVENT DISKUS) 50 MCG/BLIST diskus inhaler Inhale 1 puff into the lungs 2 (two) times daily.    [provider]  gabapentin (NEURONTIN) 600 MG tablet TAKE 1 TABLET BY MOUTH THREE TIMES DAILY 02/15/19  Kathrynn Ducking, MD  HYDROcodone-acetaminophen (NORCO/VICODIN) 5-325 MG tablet Take 1 tablet by mouth every 6 (six) hours as needed for moderate pain. Must last 28 days 03/07/19   Kathrynn Ducking, MD  levothyroxine (SYNTHROID, LEVOTHROID) 75 MCG tablet Take 1 tablet by mouth daily. 05/18/13   [provider]  vitamin B-12 (CYANOCOBALAMIN) 1000 MCG tablet Take by mouth.    [provider]    Family History Family History  Problem Relation Age of Onset  . Diabetes Mother   . Heart disease Mother   . Heart attack Mother   .  Bleeding Disorder Father   . Heart disease Sister   . Heart attack Sister   . Cancer Sister   . Deep vein thrombosis Sister   . Diabetes Sister   . Hyperlipidemia Sister   . Hypertension Sister   . Varicose Veins Sister   . AAA (abdominal aortic aneurysm) Sister   . Bleeding Disorder Sister   . Breast cancer Sister   . Heart disease Brother   . Heart attack Brother   . Hypertension Brother     Social History Social History   Tobacco Use  . Smoking status: Former Smoker    Packs/day: 1.50    Years: 35.00    Pack years: 52.50    Types: Cigarettes    Quit date: 05/11/2000    Years since quitting: 18.8  . Smokeless tobacco: Never Used  Substance Use Topics  . Alcohol use: Never    Alcohol/week: 0.0 standard drinks    Frequency: Never  . Drug use: Never     Allergies   Wasp venom and Doxycycline   Review of Systems Review of Systems  Reason unable to perform ROS: See HPI as above.     Physical Exam Triage Vital Signs ED Triage Vitals  Enc Vitals Group     BP 03/16/19 1300 (!) 152/81     Pulse Rate 03/16/19 1300 85     Resp 03/16/19 1300 20     Temp 03/16/19 1300 98.4 F (36.9 C)     Temp Source 03/16/19 1300 Oral     SpO2 03/16/19 1300 95 %     Weight --      Height --      Head Circumference --      Peak Flow --      Pain Score 03/16/19 1301 6     Pain Loc --      Pain Edu? --      Excl. in [place]? --    No data found.  Updated Vital Signs BP (!) 152/81 (BP Location: Left Arm)   Pulse 85   Temp 98.4 F (36.9 C) (Oral)   Resp 20   SpO2 95%   Physical Exam Constitutional:      General: Janet Giles is not in acute distress.    Appearance: Janet Giles is well-developed. Janet Giles is not diaphoretic.  HENT:     Head: Normocephalic and atraumatic.  Eyes:     Conjunctiva/sclera: Conjunctivae normal.     Pupils: Pupils are equal, round, and reactive to light.  Cardiovascular:     Rate and Rhythm: Normal rate and regular rhythm.     Heart sounds: Normal heart sounds. No  murmur. No friction rub. No gallop.   Pulmonary:     Effort: Pulmonary effort is normal. No accessory muscle usage or respiratory distress.     Breath sounds: Normal breath sounds. No stridor. No decreased breath sounds, wheezing, rhonchi or  rales.  Musculoskeletal:       Legs:     Comments: Tenderness to palpation surrounding hematoma/lacerations. No other tenderness to palpation. Full ROM of knee and ankles. Strength normal and equal bilaterally. NVI.   Skin:    General: Skin is warm and dry.  Neurological:     Mental Status: Janet Giles is alert and oriented to person, place, and time.      UC Treatments / Results  Labs (all labs ordered are listed, but only abnormal results are displayed) Labs Reviewed - No data to display  EKG   Radiology No results found.  Procedures Laceration Repair  Date/Time: 03/16/2019 5:41 PM Performed by: Ok Edwards, PA-C Authorized by: Ok Edwards, PA-C   Consent:    Consent obtained:  Verbal   Consent given by:  Patient   Risks discussed:  Infection, pain, poor cosmetic result, poor wound healing and need for additional repair   Alternatives discussed:  Referral, delayed treatment and no treatment Anesthesia (see MAR for exact dosages):    Anesthesia method:  Topical application   Topical anesthetic:  LET Laceration details:    Location:  Leg   Leg location:  L lower leg   Length (cm):  5   Depth (mm):  2 Repair type:    Repair type:  Simple Pre-procedure details:    Preparation:  Patient was prepped and draped in usual sterile fashion Exploration:    Hemostasis achieved with:  LET   Wound exploration: wound explored through full range of motion and entire depth of wound probed and visualized     Contaminated: no   Treatment:    Area cleansed with:  Saline   Amount of cleaning:  Extensive   Irrigation solution:  Sterile saline   Irrigation method:  Pressure wash   Visualized foreign bodies/material removed: no   Skin repair:    Repair  method:  Tissue adhesive (steristrips and dermabond) Approximation:    Approximation:  Close Post-procedure details:    Dressing:  Non-adherent dressing   Patient tolerance of procedure:  Tolerated well, no immediate complications   (including critical care time)  Medications Ordered in UC Medications  lidocaine-EPINEPHrine-tetracaine (LET) topical gel (3 mLs Topical Given 03/16/19 1333)    Initial Impression / Assessment and Plan / UC Course  I have reviewed the triage vital signs and the nursing notes.  Pertinent labs & imaging results that were available during my care of the patient were reviewed by me and considered in my medical decision making (see chart for details).    No worries for fractures or dislocation given history and exam.  Discussed laceration repair options including no repair versus Steri-Strips/dermaclip versus sutures. Risks and benefits discussed.  Patient would like to defer sutures.  Will apply LET to stop bleeding and repair with steristrip and dermaclip.   Patient tolerated procedure well without immediate complications. 4 dermaclip, 3 Steri-Strips applied.  Wound care instructions given.  Return precautions given.    Final Clinical Impressions(s) / UC Diagnoses   Final diagnoses:  Laceration of left lower leg, initial encounter  Hematoma of left lower leg   ED Prescriptions    None     PDMP not reviewed this encounter.   Ok Edwards, PA-C 03/16/19 [place], Harrold Fitchett V, PA-C 03/16/19 1746    Ok Edwards, PA-C 03/16/19 1747

## 2019-04-04 ENCOUNTER — Other Ambulatory Visit: Payer: Self-pay

## 2019-04-04 ENCOUNTER — Telehealth: Payer: Self-pay | Admitting: Neurology

## 2019-04-04 ENCOUNTER — Ambulatory Visit
Admission: EM | Admit: 2019-04-04 | Discharge: 2019-04-04 | Disposition: A | Discharge status: Home / Self Care | Payer: Medicare Other | Attending: Physician Assistant | Admitting: Physician Assistant

## 2019-04-04 DIAGNOSIS — S81812D Laceration without foreign body, left lower leg, subsequent encounter: Secondary | ICD-10-CM | POA: Diagnosis not present

## 2019-04-04 DIAGNOSIS — M79605 Pain in left leg: Secondary | ICD-10-CM | POA: Diagnosis not present

## 2019-04-04 MED ORDER — CEPHALEXIN 500 MG PO CAPS: 500.0000 mg | ORAL_CAPSULE | Freq: Four times a day (QID) | ORAL | 0 refills | Status: DC

## 2019-04-04 MED ORDER — HYDROCODONE-ACETAMINOPHEN 5-325 MG PO TABS: 1.0000 | ORAL_TABLET | Freq: Four times a day (QID) | ORAL | 0 refills | Status: DC | PRN

## 2019-04-04 MED ORDER — MELOXICAM 7.5 MG PO TABS: 7.5000 mg | ORAL_TABLET | Freq: Every day | ORAL | 0 refills | Status: DC

## 2019-04-04 NOTE — Discharge Instructions (Signed)
Continue Mobic daily for 5-7 days to decrease inflammation. Do not take ibuprofen (motrin/advil)/ naproxen (aleve) while on mobic. Start keflex to cover for infection. Warm compresses. Follow up with orthopedics if symptoms not improving.

## 2019-04-04 NOTE — ED Provider Notes (Signed)
[name]    CSN: [id number] Arrival date & time: 04/04/19  1046      History   Chief Complaint Chief Complaint  Patient presents with  . Leg Pain    HPI Janet Giles is a 78 y.o. female.   78 year old female returns to clinic for wound check after laceration repair 03/16/2019. States area has healed and was having improvement of pain. However, about 2-3 days ago, started having more pain, with tingling sensation around the wound. Denies swelling. Has noticed mild blood on bed sheets, but has not noticed active bleeding/drainage to the wound. Removed the dermaclip and steristrips around 03/26/2019 without problems. States cleaned it with hydrogen peroxide and hibiclens after removal. Noticed more redness around the wound as well. Denies spreading erythema, warmth, fever. Still able to walk without difficulty. Denies new injury/trauma since repair. Took 1 dose of mobic without significant relief.     Past Medical History:  Diagnosis Date  . AAA (abdominal aortic aneurysm) ([place])   . Asthma   . Complication of anesthesia    trouble breathing after back surgery  . Degenerative joint disease of spine    buttocks pain -remains a problem  . Family history of adverse reaction to anesthesia    one sister nasea and vomiting  . GERD (gastroesophageal reflux disease)    occ  . Hyperlipidemia   . Hypothyroidism   . Lupus ([place])    Dr. Aura Fey  . Neuromuscular disorder (HCC)    neuropathy in feet  . PONV (postoperative nausea and vomiting)   . Shortness of breath dyspnea    uses inhaler occ    Patient Active Problem List   Diagnosis Date Noted  . 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 Idiopathic peripheral neuropathy 08/13/2016  . 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 Former smoker 08/13/2016  . ANA positive 08/06/2016  . Raynaud's disease without gangrene 08/06/2016  . Primary osteoarthritis of both hands 08/06/2016  .  Primary osteoarthritis of both feet 08/06/2016  . High risk medication use 08/06/2016  . Primary osteoarthritis of right hip 03/24/2015  . 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 ABDOMINAL HYSTERECTOMY    . BACK SURGERY  02   L4-L5- fusion  . COLONOSCOPY WITH PROPOFOL N/A 03/17/2016   Procedure: COLONOSCOPY WITH PROPOFOL;  Surgeon: Garlan Fair, MD;  Location: [facility];  Service: Endoscopy;  Laterality: N/A;  . ESOPHAGOGASTRODUODENOSCOPY (EGD) WITH PROPOFOL N/A 03/17/2016   Procedure: ESOPHAGOGASTRODUODENOSCOPY (EGD) WITH PROPOFOL;  Surgeon: Garlan Fair, MD;  Location[facility];  Service: Endoscopy;  Laterality: N/A;  . JOINT REPLACEMENT    . TONSILLECTOMY AND ADENOIDECTOMY    . TOTAL HIP ARTHROPLASTY Right 03/25/2015   Procedure: TOTAL HIP ARTHROPLASTY ANTERIOR APPROACH;  Surgeon: Frederik Pear, MD;  Location: [facility];  Service: Orthopedics;  Laterality: Right;    OB History   No obstetric history on file.      Home Medications    Prior to Admission medications   Medication Sig Start Date End Date Taking? Authorizing Provider  albuterol (PROVENTIL HFA;VENTOLIN HFA) 108 (90 BASE) MCG/ACT inhaler Inhale 1 puff into the lungs every 6 (six) hours as needed for wheezing or shortness of breath.    [provider]  Calcium Carbonate-Vitamin D (CALCIUM + D PO) Take 1 tablet by mouth 2 (two) times daily.    [provider]  cephALEXin (KEFLEX) 500 MG capsule  Take 1 capsule (500 mg total) by mouth 4 (four) times daily. 04/04/19   Yu, Amy V, PA-C  [employer] BIOTIN PO Take 1 tablet by mouth daily. 10000 mcg daily bedtime    [provider]  fluticasone (FLOVENT DISKUS) 50 MCG/BLIST diskus inhaler Inhale 1 puff into the lungs 2 (two) times daily.    [provider]  gabapentin (NEURONTIN) 600 MG tablet TAKE 1 TABLET BY MOUTH THREE TIMES DAILY 02/15/19   Kathrynn Ducking, MD   HYDROcodone-acetaminophen (NORCO/VICODIN) 5-325 MG tablet Take 1 tablet by mouth every 6 (six) hours as needed for moderate pain. Must last 28 days 03/07/19   Kathrynn Ducking, MD  levothyroxine (SYNTHROID, LEVOTHROID) 75 MCG tablet Take 1 tablet by mouth daily. 05/18/13   [provider]  meloxicam (MOBIC) 7.5 MG tablet Take 1 tablet (7.5 mg total) by mouth daily. 04/04/19   Tasia Catchings, Amy V, PA-C  vitamin B-12 (CYANOCOBALAMIN) 1000 MCG tablet Take by mouth.    [provider]    Family History Family History  Problem Relation Age of Onset  . Diabetes Mother   . Heart disease Mother   . Heart attack Mother   . Bleeding Disorder Father   . Heart disease Sister   . Heart attack Sister   . Cancer Sister   . Deep vein thrombosis Sister   . Diabetes Sister   . Hyperlipidemia Sister   . Hypertension Sister   . Varicose Veins Sister   . AAA (abdominal aortic aneurysm) Sister   . Bleeding Disorder Sister   . Breast cancer Sister   . Heart disease Brother   . Heart attack Brother   . Hypertension Brother     Social History Social History   Tobacco Use  . Smoking status: Former Smoker    Packs/day: 1.50    Years: 35.00    Pack years: 52.50    Types: Cigarettes    Quit date: 05/11/2000    Years since quitting: 18.9  . Smokeless tobacco: Never Used  Substance Use Topics  . Alcohol use: Never    Alcohol/week: 0.0 standard drinks    Frequency: Never  . Drug use: Never     Allergies   Wasp venom and Doxycycline   Review of Systems Review of Systems  Reason unable to perform ROS: See HPI as above.     Physical Exam Triage Vital Signs ED Triage Vitals  Enc Vitals Group     BP 04/04/19 1058 (!) 168/77     Pulse Rate 04/04/19 1058 67     Resp 04/04/19 1058 18     Temp 04/04/19 1058 97.7 F (36.5 C)     Temp Source 04/04/19 1058 Oral     SpO2 04/04/19 1058 94 %     Weight --      Height --      Head Circumference --      Peak Flow --      Pain Score  04/04/19 1059 4     Pain Loc --      Pain Edu? --      Excl. in [place]? --    No data found.  Updated Vital Signs BP (!) 168/77 (BP Location: Left Arm)   Pulse 67   Temp 97.7 F (36.5 C) (Oral)   Resp 18   SpO2 94%   Physical Exam Constitutional:      General: She is not in acute distress.    Appearance: She is well-developed. She  is not diaphoretic.  HENT:     Head: Normocephalic and atraumatic.  Eyes:     Conjunctiva/sclera: Conjunctivae normal.     Pupils: Pupils are equal, round, and reactive to light.  Pulmonary:     Effort: Pulmonary effort is normal. No respiratory distress.  Musculoskeletal:     Comments: See picture below. Healing wound to the left mid anterior shin. Wound is clean and dry without drainage. Mild erythema surrounding wound without warmth. Superficial hematoma noted to the lateral side of the wound. Mild tenderness to palpation. Sensation intact. No obvious abscess palpated  Skin:    General: Skin is warm and dry.  Neurological:     Mental Status: She is alert and oriented to person, place, and time.        UC Treatments / Results  Labs (all labs ordered are listed, but only abnormal results are displayed) Labs Reviewed - No data to display  EKG   Radiology No results found.  Procedures Procedures (including critical care time)  Medications Ordered in UC Medications - No data to display  Initial Impression / Assessment and Plan / UC Course  I have reviewed the triage vital signs and the nursing notes.  Pertinent labs & imaging results that were available during my care of the patient were reviewed by me and considered in my medical decision making (see chart for details).    Area cleaned with hibiclens. Hematomas opened with #11 scalpel. Scant amount of blood removed. No purulent drainage. No signs of abscess. Patient tolerated procedure well without immediate complications. Will cover for infection given acute worsening of healing  process. Start keflex as directed. Continue NSAIDs for inflammation. Warm compress. Return precautions given. Patient expresses understanding and agrees to plan.  Final Clinical Impressions(s) / UC Diagnoses   Final diagnoses:  Laceration of left lower extremity, subsequent encounter  Left leg pain   ED Prescriptions    Medication Sig Dispense Auth. Provider   meloxicam (MOBIC) 7.5 MG tablet Take 1 tablet (7.5 mg total) by mouth daily. 15 tablet Yu, Amy V, PA-C   cephALEXin (KEFLEX) 500 MG capsule Take 1 capsule (500 mg total) by mouth 4 (four) times daily. 28 capsule Ok Edwards, PA-C     PDMP not reviewed this encounter.   Ok Edwards, PA-C 04/04/19 1140

## 2019-04-04 NOTE — Telephone Encounter (Signed)
The [facility] registry was checked, the patient will have a refill for hydrocodone.

## 2019-04-04 NOTE — ED Triage Notes (Signed)
Pt states here 3wks ago here do to laceration to lt lower leg. Pt c/o pain to that area. No drainage noted

## 2019-04-04 NOTE — Telephone Encounter (Signed)
1) Medication(s) Requested (by name): HYDROcodone-acetaminophen (NORCO/VICODIN) 5-325 MG tablet   2) Pharmacy of Choice: [facility] [place]

## 2019-04-19 ENCOUNTER — Ambulatory Visit (INDEPENDENT_AMBULATORY_CARE_PROVIDER_SITE_OTHER): Payer: Medicare Other | Admitting: Neurology

## 2019-04-19 ENCOUNTER — Other Ambulatory Visit: Payer: Self-pay

## 2019-04-19 ENCOUNTER — Encounter: Payer: Self-pay | Admitting: Neurology

## 2019-04-19 VITALS — BP 134/76 | HR 80 | Temp 97.8°F | Ht 64.0 in | Wt 145.3 lb

## 2019-04-19 DIAGNOSIS — G609 Hereditary and idiopathic neuropathy, unspecified: Secondary | ICD-10-CM | POA: Diagnosis not present

## 2019-04-19 MED ORDER — LEVETIRACETAM 250 MG PO TABS: 250.0000 mg | ORAL_TABLET | Freq: Two times a day (BID) | ORAL | 3 refills | Status: DC

## 2019-04-19 MED ORDER — MELOXICAM 7.5 MG PO TABS: 7.5000 mg | ORAL_TABLET | Freq: Every day | ORAL | 3 refills | Status: DC

## 2019-04-19 NOTE — Progress Notes (Signed)
Reason for visit: Peripheral neuropathy  Janet Giles is an 78 y.o. 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  The patient has a history of lupus, she is not on any disease modifying agents at this point.  She has been on gabapentin for her neuropathy, she does not have full pain control with this medication.  She takes hydrocodone if needed as well.  She recently had a laceration to the left leg and was placed on Mobic for a short period time and she realized this helped her back pain significantly.  The patient has not had any falls, occasionally she may use a cane for ambulation.  She comes to the office today for an evaluation.  The patient has discomfort in the feet if something touches them, she cannot wear regular shoes, even the sheets touching the feet at night are uncomfortable.  Past Medical History:  Diagnosis Date  . AAA (abdominal aortic aneurysm) ([place])   . Asthma   . Complication of anesthesia    trouble breathing after back surgery  . Degenerative joint disease of spine    buttocks pain -remains a problem  . Family history of adverse reaction to anesthesia    one sister nasea and vomiting  . GERD (gastroesophageal reflux disease)    occ  . Hyperlipidemia   . Hypothyroidism   . Lupus ([place])    Dr. Aura Giles  . Neuromuscular disorder (HCC)    neuropathy in feet  . PONV (postoperative nausea and vomiting)   . Shortness of breath dyspnea    uses inhaler occ    Past Surgical History:  Procedure Laterality Date  . ABDOMINAL HYSTERECTOMY    . BACK SURGERY  02   L4-L5- fusion  . COLONOSCOPY WITH PROPOFOL N/A 03/17/2016   Procedure: COLONOSCOPY WITH PROPOFOL;  Surgeon: Janet Fair, MD;  Location: [facility];  Service: Endoscopy;  Laterality: N/A;  . ESOPHAGOGASTRODUODENOSCOPY (EGD)  WITH PROPOFOL N/A 03/17/2016   Procedure: ESOPHAGOGASTRODUODENOSCOPY (EGD) WITH PROPOFOL;  Surgeon: Janet Fair, MD;  [facility];  Service: Endoscopy;  Laterality: N/A;  . JOINT REPLACEMENT    . TONSILLECTOMY AND ADENOIDECTOMY    . TOTAL HIP ARTHROPLASTY Right 03/25/2015   Procedure: TOTAL HIP ARTHROPLASTY ANTERIOR APPROACH;  Surgeon: Janet Pear, MD;  Location: [place];  Service: Orthopedics;  Laterality: Right;    Family History  Problem Relation Age of Onset  . Diabetes Mother   . Heart disease Mother   . Heart attack Mother   . Bleeding Disorder Father   . Heart disease Sister   . Heart attack Sister   . Cancer Sister   . Deep vein thrombosis Sister   . Diabetes Sister   . Hyperlipidemia Sister   . Hypertension Sister   . Varicose Veins Sister   . AAA (abdominal aortic aneurysm) Sister   . Bleeding Disorder Sister   . Breast cancer Sister   . Heart disease Brother   . Heart attack Brother   . Hypertension Brother     Social history:  reports that she quit smoking about 18 years ago. Her smoking use included cigarettes. She has a 52.50 pack-year smoking history. She has never used smokeless tobacco. She reports that she does not drink alcohol or use drugs.    Allergies  Allergen Reactions  . Wasp Venom Shortness  Of Breath and Swelling  . Janet Giles Nausea And Vomiting    Medications:  Prior to Admission medications   Medication Sig Start Date End Date Taking? Authorizing Provider  albuterol (PROVENTIL HFA;VENTOLIN HFA) 108 (90 BASE) MCG/ACT inhaler Inhale 1 puff into the lungs every 6 (six) hours as needed for wheezing or shortness of breath.   Yes [provider]  Calcium Carbonate-Vitamin D (CALCIUM + D PO) Take 1 tablet by mouth 2 (two) times daily.   Yes [provider]  cephALEXin (KEFLEX) 500 MG capsule Take 1 capsule (500 mg total) by mouth 4 (four) times daily. 04/04/19  Yes Yu, Amy V, PA-C  [employer] BIOTIN PO Take 1 tablet by mouth  daily. 10000 mcg daily bedtime   Yes [provider]  fluticasone (FLOVENT DISKUS) 50 MCG/BLIST diskus inhaler Inhale 1 puff into the lungs 2 (two) times daily.   Yes [provider]  gabapentin (NEURONTIN) 600 MG tablet TAKE 1 TABLET BY MOUTH THREE TIMES DAILY 02/15/19  Yes Janet Ducking, MD  HYDROcodone-acetaminophen (NORCO/VICODIN) 5-325 MG tablet Take 1 tablet by mouth every 6 (six) hours as needed for moderate pain. Must last 28 days 04/04/19  Yes Janet Ducking, MD  levothyroxine (SYNTHROID, LEVOTHROID) 75 MCG tablet Take 1 tablet by mouth daily. 05/18/13  Yes [provider]  vitamin B-12 (CYANOCOBALAMIN) 1000 MCG tablet Take by mouth.   Yes [provider]  meloxicam (MOBIC) 7.5 MG tablet Take 1 tablet (7.5 mg total) by mouth daily. Patient not taking: Reported on 04/19/2019 04/04/19   Janet Edwards, PA-C    ROS:  Out of a complete 14 system review of symptoms, the patient complains only of the following symptoms, and all other reviewed systems are negative.  Back pain, hand discomfort Foot pain, dysesthesias  Blood pressure 134/76, pulse 80, temperature 97.8 F (36.6 C), temperature source Temporal, height 5\' 4"  (1.626 m), weight 145 lb 5 oz (65.9 kg).  Physical Exam  General: The patient is alert and cooperative at the time of the examination.  Skin: No significant peripheral edema is noted.   Neurologic Exam  Mental status: The patient is alert and oriented x 3 at the time of the examination. The patient has apparent normal recent and remote memory, with an apparently normal attention span and concentration ability.   Cranial nerves: Facial symmetry is present. Speech is normal, no aphasia or dysarthria is noted. Extraocular movements are full. Visual fields are full.  Motor: The patient has good strength in all 4 extremities.  Sensory examination: Soft touch sensation is symmetric on the face, arms, and legs.  Coordination: The  patient has good finger-nose-finger and heel-to-shin bilaterally.  Gait and station: The patient has a normal gait. Tandem gait is minimally unsteady. Romberg is negative. No drift is seen.  Reflexes: Deep tendon reflexes are symmetric.   Assessment/Plan:  1.  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  History of lupus, back pain  The patient will be given a prescription for the Mobic 7.5 mg tablets taking 1 daily.  She will be placed on Keppra taking 250 mg twice daily for the neuropathy pain, she will continue the gabapentin.  She will follow-up here in 6 months, sooner if needed.  Jill Alexanders MD 04/19/2019 9:57 AM  [facility] [address] [place], [place] [address]  Phone [phone] Fax [phone]

## 2019-04-20 ENCOUNTER — Ambulatory Visit: Payer: Medicare Other | Admitting: Family

## 2019-04-20 ENCOUNTER — Other Ambulatory Visit (HOSPITAL_COMMUNITY): Payer: Medicare Other

## 2019-04-26 ENCOUNTER — Telehealth (HOSPITAL_COMMUNITY): Payer: Self-pay | Admitting: *Deleted

## 2019-04-26 ENCOUNTER — Other Ambulatory Visit: Payer: Self-pay

## 2019-04-26 DIAGNOSIS — I714 Abdominal aortic aneurysm, without rupture, unspecified: Secondary | ICD-10-CM

## 2019-04-26 NOTE — Telephone Encounter (Signed)
The above patient or their representative was contacted and gave the following answers to these questions:         Do you have any of the following symptoms?    NO  Fever                    Cough                   Shortness of breath  Do  you have any of the following other symptoms?    muscle pain         vomiting,        diarrhea        rash         weakness        red eye        abdominal pain         bruising          bruising or bleeding              joint pain           severe headache    Have you been in contact with someone who was or has been sick in the past 2 weeks?  NO  Yes                 Unsure                         Unable to assess   Does the person that you were in contact with have any of the following symptoms? n  Cough         shortness of breath           muscle pain         vomiting,            diarrhea            rash            weakness           fever            red eye           abdominal pain           bruising  or  bleeding                joint pain                severe headache                 COMMENTS OR ACTION PLAN FOR THIS PATIENT:         q 

## 2019-04-27 ENCOUNTER — Ambulatory Visit (INDEPENDENT_AMBULATORY_CARE_PROVIDER_SITE_OTHER): Payer: Medicare Other | Admitting: Family

## 2019-04-27 ENCOUNTER — Encounter: Payer: Self-pay | Admitting: Family

## 2019-04-27 ENCOUNTER — Ambulatory Visit (HOSPITAL_COMMUNITY)
Admission: RE | Admit: 2019-04-27 | Discharge: 2019-04-27 | Disposition: A | Discharge status: Home / Self Care | Payer: Medicare Other | Source: Ambulatory Visit | Attending: Family | Admitting: Family

## 2019-04-27 ENCOUNTER — Other Ambulatory Visit: Payer: Self-pay

## 2019-04-27 VITALS — BP 108/68 | HR 73 | Temp 97.3°F | Resp 20 | Ht 64.0 in | Wt 142.0 lb

## 2019-04-27 DIAGNOSIS — R011 Cardiac murmur, unspecified: Secondary | ICD-10-CM

## 2019-04-27 DIAGNOSIS — I77811 Abdominal aortic ectasia: Secondary | ICD-10-CM | POA: Diagnosis not present

## 2019-04-27 DIAGNOSIS — I714 Abdominal aortic aneurysm, without rupture, unspecified: Secondary | ICD-10-CM

## 2019-04-27 NOTE — Patient Instructions (Signed)
Abdominal Aortic Aneurysm  An aneurysm is a bulge in one of the blood vessels that carry blood away from the heart (artery). It happens when blood pushes up against a weak or damaged place in the wall of an artery. An abdominal aortic aneurysm happens in the main artery of the body (aorta). Some aneurysms may not cause problems. If it grows, it can burst or tear, causing bleeding inside the body. This is an emergency. It needs to be treated right away. What are the causes? The exact cause of this condition is not known. What increases the risk? The following may make you more likely to get this condition:  Being a female who is 78 years of age or older.  Being white (Caucasian).  Using tobacco.  Having a family history of aneurysms.  Having the following conditions: ? Hardening of the arteries (arteriosclerosis). ? Inflammation of the walls of an artery (arteritis). ? Certain genetic conditions. ? Being very overweight (obesity). ? An infection in the wall of the aorta (infectious aortitis). ? High cholesterol. ? High blood pressure (hypertension). What are the signs or symptoms? Symptoms depend on the size of the aneurysm and how fast it is growing. Most grow slowly and do not cause any symptoms. If symptoms do occur, they may include:  Pain in the belly (abdomen), side, or back.  Feeling full after eating only small amounts of food.  Feeling a throbbing lump in the belly. Symptoms that the aneurysm has burst (ruptured) include:  Sudden, very bad pain in the belly, side, or back.  Feeling sick to your stomach (nauseous).  Throwing up (vomiting).  Feeling light-headed or passing out. How is this treated? Treatment for this condition depends on:  The size of the aneurysm.  How fast it is growing.  78.  Your risk of having it burst. If your aneurysm is smaller than 2 inches (5 cm), your doctor may manage it by:  Checking it often to see if it is getting bigger.  You may have an imaging test (ultrasound) to check it every 3-6 months, every year, or every few years.  Giving you medicines to: ? Control blood pressure. ? Treat pain. ? Fight infection. If your aneurysm is larger than 2 inches (5 cm), you may need surgery to fix it. Follow these instructions at home: Lifestyle  Do not use any products that have nicotine or tobacco in them. This includes cigarettes, e-cigarettes, and chewing tobacco. If you need help quitting, ask your doctor.  Get regular exercise. Ask your doctor what types of exercise are best for you. Eating and drinking  Eat a heart-healthy diet. This includes eating plenty of: ? Fresh fruits and vegetables. ? Whole grains. ? Low-fat (lean) protein. ? Low-fat dairy products.  Avoid foods that are high in saturated fat and cholesterol. These foods include red meat and some dairy products.  Do not drink alcohol if: ? Your doctor tells you not to drink. ? You are pregnant, may be pregnant, or are planning to become pregnant.  If you drink alcohol: ? Limit how much you use to:  0-1 drink a day for women.  0-2 drinks a day for men. ? Be aware of how much alcohol is in your drink. In the [place] one drink equals any of these:  One typical bottle of beer (12 oz).  One-half glass of wine (5 oz).  One shot of hard liquor (1 oz). General instructions  Take over-the-counter and prescription medicines only as   told by your doctor.  Keep your blood pressure within normal limits. Ask your doctor what your blood pressure should be.  Have your blood sugar (glucose) level and cholesterol levels checked regularly. Keep your blood sugar level and cholesterol levels within normal limits.  Avoid heavy lifting and activities that take a lot of effort. Ask your doctor what activities are safe for you.  Keep all follow-up visits as told by your doctor. This is important. ? Talk to your doctor about regular screenings to see if the  aneurysm is getting bigger. Contact a doctor if you:  Have pain in your belly, side, or back.  Have a throbbing feeling in your belly.  Have a family history of aneurysms. Get help right away if you:  Have sudden, bad pain in your belly, side, or back.  Feel sick to your stomach.  Throw up.  Have trouble pooping (constipation).  Have trouble peeing (urinating).  Feel light-headed.  Have a fast heart rate when you stand.  Have sweaty skin that is cold to the touch (clammy).  Have shortness of breath.  Have a fever. These symptoms may be an emergency. Do not wait to see if the symptoms will go away. Get medical help right away. Call your local emergency services (911 in the [place] Do not drive yourself to the hospital. Summary  An aneurysm is a bulge in one of the blood vessels that carry blood away from the heart (artery). Some aneurysms may not cause problems.  You may need to have yours checked often. If it grows, it can burst or tear. This causes bleeding inside the body. It needs to be treated right away.  Follow instructions from your doctor about healthy lifestyle changes.  Keep all follow-up visits as told by your doctor. This is important. This information is not intended to replace advice given to you by your health care provider. Make sure you discuss any questions you have with your health care provider. Document Released: 08/22/2012 Document Revised: 08/15/2018 Document Reviewed: 12/04/2017 Elsevier Patient Education  2020 Elsevier Inc.  

## 2019-04-27 NOTE — Progress Notes (Signed)
[facility] [place]   CC: Follow up Abdominal Aortic Ectasia   History of Present Illness  Janet Giles is a 78 y.o. (October 30, 1940) female whom Dr. 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 She returns today for follow up.   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  The patient does have chronic back pain and has undergone back procedures in the past. She has some intermittent sharp umbilical area pain, pt states her PCP told her this may be due to an umbilical hernia. The patient is not a smoker. She has never had any embolic symptoms. Pt received sclerotherapy with [facility].  Dr. Bridgett Larsson last saw pt on 05/26/13. At that time, based on the patient's exam and diagnostic studies, Dr. Bridgett Larsson recommended every 2 year aortic duplex. If no further growth, can consider discontinuing AAA duplex.  She had a right hip replacement surgery on 03/25/15, Dr. Frederik Pear with [facility].  Her feet symptoms are bothersome to her since June 2016: numbness, feet and hands stay cold. These symptoms are the same whether she is supine or sitting, more painful on the soles of her feet with walking. She has a lumpy sensation on the sole of her feet when walking. She saw Dr. Jannifer Franklin for these symptoms.   She has chronic low back pain, had lumbar spine surgery about 2001.   She denies non healing wounds.  The patient denies history of stroke or TIA symptoms.  Diabetic: No Tobacco use: former smoker, quit in 2001  Past Medical History:  Diagnosis Date  . AAA (abdominal aortic aneurysm) ([place])   . Asthma   . Complication of anesthesia    trouble breathing after back surgery  . Degenerative joint disease of spine    buttocks pain -remains a problem  . Family history of adverse reaction to anesthesia    one sister nasea and vomiting  . GERD (gastroesophageal reflux disease)    occ  . Hyperlipidemia   .  Hypothyroidism   . Lupus ([place])    Dr. Aura Fey  . Neuromuscular disorder (HCC)    neuropathy in feet  . PONV (postoperative nausea and vomiting)   . Shortness of breath dyspnea    uses inhaler occ   Past Surgical History:  Procedure Laterality Date  . ABDOMINAL HYSTERECTOMY    . BACK SURGERY  02   L4-L5- fusion  . COLONOSCOPY WITH PROPOFOL N/A 03/17/2016   Procedure: COLONOSCOPY WITH PROPOFOL;  Surgeon: Garlan Fair, MD;  [facility];  Service: Endoscopy;  Laterality: N/A;  . ESOPHAGOGASTRODUODENOSCOPY (EGD) WITH PROPOFOL N/A 03/17/2016   Procedure: ESOPHAGOGASTRODUODENOSCOPY (EGD) WITH PROPOFOL;  Surgeon: Garlan Fair, MD;  [facility];  Service: Endoscopy;  Laterality: N/A;  . JOINT REPLACEMENT    . TONSILLECTOMY AND ADENOIDECTOMY    . TOTAL HIP ARTHROPLASTY Right 03/25/2015   Procedure: TOTAL HIP ARTHROPLASTY ANTERIOR APPROACH;  Surgeon: Frederik Pear, MD;  Location: [place];  Service: Orthopedics;  Laterality: Right;   Social History Social History   Socioeconomic History  . Marital status: Married    Spouse name: Not on file  . Number of children: 3  . Years of education: Not on file  . Highest education level: Some college, no degree  Occupational History  . Occupation: Retired  Tobacco Use  . Smoking status: Former Smoker    Packs/day: 1.50    Years: 35.00    Pack years: 52.50    Types: Cigarettes  Quit date: 05/11/2000    Years since quitting: 18.9  . Smokeless tobacco: Never Used  Substance and Sexual Activity  . Alcohol use: Never    Alcohol/week: 0.0 standard drinks  . Drug use: Never  . Sexual activity: Not on file  Other Topics Concern  . Not on file  Social History Narrative   Right handed   2 cups of caffeine daily   Lives at home with husband and granddaughter    Social Determinants of Health   Financial Resource Strain:   . Difficulty of Paying Living Expenses: Not on file  Food Insecurity:   . Worried  About Charity fundraiser in the Last Year: Not on file  . Ran Out of Food in the Last Year: Not on file  Transportation Needs:   . Lack of Transportation (Medical): Not on file  . Lack of Transportation (Non-Medical): Not on file  Physical Activity:   . Days of Exercise per Week: Not on file  . Minutes of Exercise per Session: Not on file  Stress:   . Feeling of Stress : Not on file  Social Connections:   . Frequency of Communication with Friends and Family: Not on file  . Frequency of Social Gatherings with Friends and Family: Not on file  . Attends Religious Services: Not on file  . Active Member of Clubs or Organizations: Not on file  . Attends Archivist Meetings: Not on file  . Marital Status: Not on file  Intimate Partner Violence:   . Fear of Current or Ex-Partner: Not on file  . Emotionally Abused: Not on file  . Physically Abused: Not on file  . Sexually Abused: Not on file   Family History Family History  Problem Relation Age of Onset  . Diabetes Mother   . Heart disease Mother   . Heart attack Mother   . Bleeding Disorder Father   . Heart disease Sister   . Heart attack Sister   . Cancer Sister   . Deep vein thrombosis Sister   . Diabetes Sister   . Hyperlipidemia Sister   . Hypertension Sister   . Varicose Veins Sister   . AAA (abdominal aortic aneurysm) Sister   . Bleeding Disorder Sister   . Breast cancer Sister   . Heart disease Brother   . Heart attack Brother   . Hypertension Brother     Current Outpatient Medications on File Prior to Visit  Medication Sig Dispense Refill  . albuterol (PROVENTIL HFA;VENTOLIN HFA) 108 (90 BASE) MCG/ACT inhaler Inhale 1 puff into the lungs every 6 (six) hours as needed for wheezing or shortness of breath.    . Calcium Carbonate-Vitamin D (CALCIUM + D PO) Take 1 tablet by mouth 2 (two) times daily.    . cephALEXin (KEFLEX) 500 MG capsule Take 1 capsule (500 mg total) by mouth 4 (four) times daily. 28 capsule 0   . [facility] BIOTIN PO Take 1 tablet by mouth daily. 10000 mcg daily bedtime    . fluticasone (FLOVENT DISKUS) 50 MCG/BLIST diskus inhaler Inhale 1 puff into the lungs 2 (two) times daily.    Marland Kitchen gabapentin (NEURONTIN) 600 MG tablet TAKE 1 TABLET BY MOUTH THREE TIMES DAILY 90 tablet 3  . HYDROcodone-acetaminophen (NORCO/VICODIN) 5-325 MG tablet Take 1 tablet by mouth every 6 (six) hours as needed for moderate pain. Must last 28 days 60 tablet 0  . levETIRAcetam (KEPPRA) 250 MG tablet Take 1 tablet (250 mg total) by mouth 2 (  two) times daily. 60 tablet 3  . levothyroxine (SYNTHROID, LEVOTHROID) 75 MCG tablet Take 1 tablet by mouth daily.    . meloxicam (MOBIC) 7.5 MG tablet Take 1 tablet (7.5 mg total) by mouth daily. 30 tablet 3  . vitamin B-12 (CYANOCOBALAMIN) 1000 MCG tablet Take by mouth.     No current facility-administered medications on file prior to visit.   Allergies  Allergen Reactions  . Wasp Venom Shortness Of Breath and Swelling  . Doxycycline Nausea And Vomiting    ROS: See HPI for pertinent positives and negatives.  Physical Examination  Vitals:   04/27/19 0932  BP: 108/68  Pulse: 73  Resp: 20  Temp: (!) 97.3 F (36.3 C)  SpO2: 97%  Weight: 142 lb (64.4 kg)  Height: 5\' 4"  (1.626 m)   Body mass index is 24.37 kg/m.  General: A&O x 3, WD, petite female in NAD. HEENT: Grossly intact and WNL.  Pulmonary: Sym exp, respirations are non labored, good air movement in all fileds, CTAB, no rales, rhonchi, or wheezes. Cardiac: Regular rhythm and rate, mid grade murmur ausculted   Carotid Bruits Right Left   Negative Negative   Adominal aortic pulse is not palpable Radial pulses are 2+ palpable                           VASCULAR EXAM:                                                                                                         LE Pulses Right Left       FEMORAL  2+ palpable  2+ palpable        POPLITEAL  1+ palpable   not palpable       POSTERIOR TIBIAL  2+  palpable   not palpable        DORSALIS PEDIS      ANTERIOR TIBIAL not palpable  1+ palpable    Gastrointestinal: soft, NTND, -G/R, - HSM, - masses palpated, - CVAT B. Musculoskeletal: M/S 5/5 in upper extremities, 3-4/5 in lower extremitiies, Extremities without ischemic changes. Skin: No rashes, no ulcers, no cellulitis.   Neurologic: CN 2-12 intact, Pain and light touch intact in extremities are intact, motor exam as listed above. Psychiatric: Normal thought content, mood appropriate to clinical situation.     DATA  AAA Duplex (04/27/2019): Abdominal Aorta Findings: +-----------+-------+----------+----------+--------+--------+--------+ Location   AP (cm)Trans (cm)PSV (cm/s)WaveformThrombusComments +-----------+-------+----------+----------+--------+--------+--------+ Proximal   1.66   1.80      57                                 +-----------+-------+----------+----------+--------+--------+--------+ Mid        1.60   1.74      80        biphasic                 +-----------+-------+----------+----------+--------+--------+--------+ Distal     2.73   2.61  86        biphasic                 +-----------+-------+----------+----------+--------+--------+--------+ RT CIA Prox0.8    0.7       111       biphasic                 +-----------+-------+----------+----------+--------+--------+--------+ LT CIA Prox1.2    1.2       91        biphasic                 +-----------+-------+----------+----------+--------+--------+--------+ Summary: Abdominal Aorta: The largest aortic diameter remains essentially unchanged compared to prior exam. Previous diameter measurement was 2.7 cm obtained on 03/05/2017.   Medical Decision Making  The patient is a 78 y.o. female who presents with asymptomatic abdominal aortic ectasia with no increase in size, at 2.7 cm.  Mid grade cardiac murmur auscultated. Pt is not seeing a cardiologist, has no known  cardiac murmur, denies any known cardiac problems. She states her dyspnea and fatigue are worsening. I advised her to discuss with her PCP whether an echocardiogram would be advisable.    Based on this patient's exam and diagnostic studies, the patient will follow up in 2 years  with the following studies: AAA duplex.  Consideration for repair of AAA would be made when the size is 5.5cm, growth > 1 cm/yr, and symptomatic status.  Abdominal aortic aneurysm less than 5-1/2 cm in diameter has less then 1/2% risk of rupture per year.        The patient was given information about AAA including signs, symptoms, treatment, and how to minimize the risk of enlargement and rupture of aneurysms.    I emphasized the importance of maximal medical management including strict control of blood pressure, blood glucose, and lipid levels, antiplatelet agents, obtaining regular exercise, and continued cessation of smoking.   The patient was advised to call 911 should the patient experience sudden onset abdominal or back pain.   Thank you for allowing [place] to participate in this patient's care.  Clemon Chambers, RN, MSN, FNP-C [facility] Office: [facility] Physician: Trula Slade  04/27/2019, 9:47 AM

## 2019-04-28 ENCOUNTER — Other Ambulatory Visit (HOSPITAL_COMMUNITY): Payer: Self-pay

## 2019-04-28 DIAGNOSIS — R011 Cardiac murmur, unspecified: Secondary | ICD-10-CM

## 2019-05-02 ENCOUNTER — Telehealth: Payer: Self-pay | Admitting: Neurology

## 2019-05-02 MED ORDER — HYDROCODONE-ACETAMINOPHEN 5-325 MG PO TABS: 1.0000 | ORAL_TABLET | Freq: Four times a day (QID) | ORAL | 0 refills | Status: DC | PRN

## 2019-05-02 NOTE — Telephone Encounter (Signed)
Lastt refill of hydrocodone was 04/04/2019 # 60 for a 28 day supply. Provided by Dr. Jannifer Franklin. Last 6 refills have been rx'd by Dr. Jannifer Franklin.

## 2019-05-02 NOTE — Telephone Encounter (Signed)
1) Medication(s) Requested (by name): HYDROcodone-acetaminophen (NORCO/VICODIN) 5-325 MG tablet   2) Pharmacy of Choice: [facility] [place]  [id number], [place] ([place]) [place] [address]

## 2019-05-02 NOTE — Telephone Encounter (Signed)
A prescription for hydrocodone will be sent in.

## 2019-05-08 ENCOUNTER — Ambulatory Visit (HOSPITAL_COMMUNITY): Payer: Medicare Other | Attending: Cardiology

## 2019-05-08 ENCOUNTER — Other Ambulatory Visit: Payer: Self-pay

## 2019-05-08 DIAGNOSIS — R011 Cardiac murmur, unspecified: Secondary | ICD-10-CM | POA: Diagnosis present

## 2019-05-11 ENCOUNTER — Telehealth: Payer: Self-pay | Admitting: Cardiology

## 2019-05-11 NOTE — Telephone Encounter (Signed)
It means that her aortic valve is thickened what is a normal aging process, however the degree of stenosis is mild and does not need to be intervened on, it will be followed on a regular basis to see if it is getting worse.  Nothing needs to be done right now.

## 2019-05-11 NOTE — Telephone Encounter (Signed)
Janet Giles from [facility] office is calling to get clarification on Echocardiogram that Dr. Meda Coffee read specifically on impressions 12 and 13. Please call to discuss. Ask for [name].

## 2019-05-11 NOTE — Telephone Encounter (Signed)
I spoke to Cliff[place] from [place], Dr Firefighter.  She called because Dr Alroy Dust would like a better explanation for Impressions 12 and 13 from the patient's Echo reading.  Please reach out to Dr Virgilio Belling assistant Ollen Gross [phone]) to further explain.  Thank you.

## 2019-05-15 ENCOUNTER — Other Ambulatory Visit: Payer: Self-pay | Admitting: *Deleted

## 2019-05-15 DIAGNOSIS — I77811 Abdominal aortic ectasia: Secondary | ICD-10-CM

## 2019-05-16 ENCOUNTER — Telehealth: Payer: Self-pay | Admitting: Cardiology

## 2019-05-16 NOTE — Telephone Encounter (Signed)
Attempted to call Ollen Gross at [facility], at number provided, and phone goes straight to an automated message requesting to enter in employees entire name.  Will fax this telephone encounter with Dr. Francesca Oman response regarding pts echo read, to [place] at [place], via epic fax function.

## 2019-05-16 NOTE — Telephone Encounter (Signed)
Spoke with Janet Giles at [facility] and informed her of what Dr. Meda Coffee advised on the pts echo reading.  Informed Janet Giles that I also faxed this encounter to her office so that Dr. Alroy Dust could review, and determine pts course of treatment from there, being she is not established with a Cardiologist.  Janet Giles verbalized understanding and agrees with this plan. Janet Giles was more than gracious for all the assistance provided.

## 2019-05-16 NOTE — Telephone Encounter (Signed)
New Message    Pt is calling about her Echo that she had done  She states she saw on my chart the results and that her Primary Care has different results then what she has on my chart     Please call

## 2019-05-16 NOTE — Telephone Encounter (Signed)
Pt is calling about her echo report her PCP endorsed to her from our office.  Pts PCP Dr. Alroy Dust ordered for the pt to have an echo done by our office.  Dr. Meda Coffee was the reader of the echo. Pts PCP office called her today to give her, her echo results in simple terms, to make sense of what she was reading on mychart.  Pt states she is wanting to reach out to her PCP to ask if she could get a referral to our office to establish with a Cardiologist, given her echo results, age, and strong family history of cardiac issues.  Advised the pt to reach out to her PCP, who was also the ordering Provider of her echo, to obtain what her course of treatment based on those results should be.  Pt states the reason her PCP ordered the echo is because she was experiencing some sob.  Informed the pt that I think its a reasonable request to reach out to her PCP Dr. Alroy Dust, and request that he refer her to see a Cardiologist with our Practice, for her complaints of sob and to establish care.  Advised the pt to reach out to her PCP about this.  Pt verbalized understanding and agrees with this plan.

## 2019-06-05 ENCOUNTER — Other Ambulatory Visit: Payer: Self-pay | Admitting: Neurology

## 2019-06-05 MED ORDER — HYDROCODONE-ACETAMINOPHEN 5-325 MG PO TABS: 1.0000 | ORAL_TABLET | Freq: Four times a day (QID) | ORAL | 0 refills | Status: DC | PRN

## 2019-06-05 NOTE — Telephone Encounter (Signed)
Message sent to Dr .Krista Blue work in am doctor refill on hydrocodone medication.

## 2019-06-05 NOTE — Addendum Note (Signed)
Addended by: Marcial Pacas on: 06/05/2019 05:40 PM   Modules accepted: Orders

## 2019-06-05 NOTE — Telephone Encounter (Signed)
Pt has called for a refill on her HYDROcodone-acetaminophen (NORCO/VICODIN) 5-325 MG tablet   [facility] [phone]

## 2019-06-20 NOTE — Progress Notes (Signed)
CARDIOLOGY CONSULT NOTE       Patient ID: Janet Giles MRN: [id number] DOB/AGE: 1940/06/24 79 y.o.  Admit date: (Not on file) Referring Physician: Marlou Sa Primary Physician: Alroy Dust, L.Marlou Sa, MD Primary Cardiologist: new Reason for Consultation: Aortic Valve Disease   Active Problems:   * No active hospital problems. *   HPI:  79 y.o. referred by Dr Marlou Sa for aortic valve disease Last echo done 05/08/19 with tri leaflet AV mean gradient Only 7 mmHg normal EF and mild MR She has also had serial abdominal US for AAA but most recent 04/27/19 only 2.7 cm unchanged from 2018 She has history of hypothyroidism and seizures. As well as asthma and neuropathy in her feet takes Gabapentin and sees Dr Jannifer Franklin in neurology   She has had exertional dyspnea worse last few months. Diagnosed with asthma Has not had PFTls in years No history of DVT/ PE or connective tissue disease   ROS All other systems reviewed and negative except as noted above  Past Medical History:  Diagnosis Date  . AAA (abdominal aortic aneurysm) ([place])   . ANA positive 08/06/2016  . 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 Asthma   . Complication of anesthesia    trouble breathing after back surgery  . 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 Carloyn Manner  . Degenerative joint disease of spine    buttocks pain -remains a problem  . Family history of adverse reaction to anesthesia    one sister nasea and vomiting  . Former smoker 08/13/2016  . GERD (gastroesophageal reflux disease)    occ  . High risk medication use 08/06/2016  . 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 Hyperlipidemia   . Hypothyroidism   . Idiopathic peripheral neuropathy 08/13/2016  . Lupus ([place])    Dr. Aura Fey  . Neuromuscular disorder (HCC)    neuropathy in feet  . PONV (postoperative nausea and vomiting)   . Primary osteoarthritis of both feet 08/06/2016  . Primary osteoarthritis of both hands 08/06/2016   . Primary osteoarthritis of right hip 03/24/2015  . Raynaud's disease without gangrene 08/06/2016  . Shortness of breath dyspnea    uses inhaler occ    Family History  Problem Relation Age of Onset  . Diabetes Mother   . Heart disease Mother   . Heart attack Mother   . Bleeding Disorder Father   . Heart disease Sister   . Heart attack Sister   . Cancer Sister   . Deep vein thrombosis Sister   . Diabetes Sister   . Hyperlipidemia Sister   . Hypertension Sister   . Varicose Veins Sister   . AAA (abdominal aortic aneurysm) Sister   . Bleeding Disorder Sister   . Breast cancer Sister   . Heart disease Brother   . Heart attack Brother   . Hypertension Brother     Social History   Socioeconomic History  . Marital status: Married    Spouse name: Not on file  . Number of children: 3  . Years of education: Not on file  . Highest education level: Some college, no degree  Occupational History  . Occupation: Retired  Tobacco Use  . Smoking status: Former Smoker    Packs/day: 1.50    Years: 35.00    Pack years: 52.50    Types: Cigarettes    Quit date: 05/11/2000    Years since quitting: 19.1  . Smokeless tobacco: Never Used  Substance and Sexual Activity  .  Alcohol use: Never    Alcohol/week: 0.0 standard drinks  . Drug use: Never  . Sexual activity: Not on file  Other Topics Concern  . Not on file  Social History Narrative   Right handed   2 cups of caffeine daily   Lives at home with husband and granddaughter    Social Determinants of Health   Financial Resource Strain:   . Difficulty of Paying Living Expenses: Not on file  Food Insecurity:   . Worried About Charity fundraiser in the Last Year: Not on file  . Ran Out of Food in the Last Year: Not on file  Transportation Needs:   . Lack of Transportation (Medical): Not on file  . Lack of Transportation (Non-Medical): Not on file  Physical Activity:   . Days of Exercise per Week: Not on file  . Minutes of  Exercise per Session: Not on file  Stress:   . Feeling of Stress : Not on file  Social Connections:   . Frequency of Communication with Friends and Family: Not on file  . Frequency of Social Gatherings with Friends and Family: Not on file  . Attends Religious Services: Not on file  . Active Member of Clubs or Organizations: Not on file  . Attends Archivist Meetings: Not on file  . Marital Status: Not on file  Intimate Partner Violence:   . Fear of Current or Ex-Partner: Not on file  . Emotionally Abused: Not on file  . Physically Abused: Not on file  . Sexually Abused: Not on file    Past Surgical History:  Procedure Laterality Date  . ABDOMINAL HYSTERECTOMY    . BACK SURGERY  02   L4-L5- fusion  . COLONOSCOPY WITH PROPOFOL N/A 03/17/2016   Procedure: COLONOSCOPY WITH PROPOFOL;  Surgeon: Garlan Fair, MD;  Location: [facility];  Service: Endoscopy;  Laterality: N/A;  . ESOPHAGOGASTRODUODENOSCOPY (EGD) WITH PROPOFOL N/A 03/17/2016   Procedure: ESOPHAGOGASTRODUODENOSCOPY (EGD) WITH PROPOFOL;  Surgeon: Garlan Fair, MD;  Location: [facility];  Service: Endoscopy;  Laterality: N/A;  . JOINT REPLACEMENT    . TONSILLECTOMY AND ADENOIDECTOMY    . TOTAL HIP ARTHROPLASTY Right 03/25/2015   Procedure: TOTAL HIP ARTHROPLASTY ANTERIOR APPROACH;  Surgeon: Frederik Pear, MD;  Location: [facility];  Service: Orthopedics;  Laterality: Right;      Current Outpatient Medications:  .  albuterol (PROVENTIL HFA;VENTOLIN HFA) 108 (90 BASE) MCG/ACT inhaler, Inhale 1 puff into the lungs every 6 (six) hours as needed for wheezing or shortness of breath., Disp: , Rfl:  .  Calcium Carbonate-Vitamin D (CALCIUM + D PO), Take 1 tablet by mouth 2 (two) times daily., Disp: , Rfl:  .  cephALEXin (KEFLEX) 500 MG capsule, Take 1 capsule (500 mg total) by mouth 4 (four) times daily., Disp: 28 capsule, Rfl: 0 .  [employer] BIOTIN PO, Take 1 tablet by mouth daily. 10000 mcg daily bedtime, Disp: , Rfl:  .   gabapentin (NEURONTIN) 600 MG tablet, TAKE 1 TABLET BY MOUTH THREE TIMES DAILY, Disp: 90 tablet, Rfl: 3 .  HYDROcodone-acetaminophen (NORCO/VICODIN) 5-325 MG tablet, Take 1 tablet by mouth every 6 (six) hours as needed for moderate pain. Must last 28 days, Disp: 60 tablet, Rfl: 0 .  levothyroxine (SYNTHROID, LEVOTHROID) 75 MCG tablet, Take 1 tablet by mouth daily., Disp: , Rfl:  .  meloxicam (MOBIC) 7.5 MG tablet, Take 1 tablet (7.5 mg total) by mouth daily., Disp: 30 tablet, Rfl: 3 .  vitamin B-12 (  CYANOCOBALAMIN) 1000 MCG tablet, Take by mouth., Disp: , Rfl:  .  FLOVENT HFA 110 MCG/ACT inhaler, , Disp: , Rfl:     Physical Exam: Blood pressure 118/82, pulse 80, height 5\' 4"  (1.626 m), weight 147 lb (66.7 kg), SpO2 98 %.    Affect appropriate Healthy:  appears stated age 43: normal Neck supple with no adenopathy JVP normal no bruits no thyromegaly Lungs inspiratory crackles at base no wheezing and good diaphragmatic motion Heart:  S1/S2  Mild AS  murmur, no rub, gallop or click PMI normal Abdomen: benighn, BS positve, no tenderness, no AAA no bruit.  No HSM or HJR Distal pulses intact with no bruits No edema Neuro non-focal Skin warm and dry No muscular weakness   Labs:   Lab Results  Component Value Date   WBC 6.4 02/14/2018   HGB 10.9 (L) 02/14/2018   HCT 32.4 (L) 02/14/2018   MCV 91.5 02/14/2018   PLT 353 02/14/2018      Radiology: No results found.  EKG: SR rate 63 normal ECG 03/13/15 06/28/19 SR rate 80 normal    ASSESSMENT AND PLAN:   1. Aortic Stenosis:  Mild by echo 05/08/19 repeat in a year  Discussed diagnosis and natural history of valve disease.  2. Neuropathy:  Continue gabapentin f/u Dr Jannifer Franklin 3. AAA: not clear this needs yearly [place] f/u has diameter only 2.7 cm since 2018 f/u primary  4. Hypothyroidism :  On replacement labs with primary  5. Lipids:  F/u primary target LDL 70 or less 6. Dyspnea:  Not related to heart Abnormal lung exam Non contrast  chest CT r/o ILD   Signed: Jenkins Rouge 06/28/2019, 3:58 PM

## 2019-06-28 ENCOUNTER — Other Ambulatory Visit: Payer: Self-pay

## 2019-06-28 ENCOUNTER — Ambulatory Visit (INDEPENDENT_AMBULATORY_CARE_PROVIDER_SITE_OTHER): Payer: Medicare Other | Admitting: Cardiovascular Disease

## 2019-06-28 ENCOUNTER — Encounter: Payer: Self-pay | Admitting: Cardiovascular Disease

## 2019-06-28 VITALS — BP 118/82 | HR 80 | Ht 64.0 in | Wt 147.0 lb

## 2019-06-28 DIAGNOSIS — I350 Nonrheumatic aortic (valve) stenosis: Secondary | ICD-10-CM

## 2019-06-28 DIAGNOSIS — R0609 Other forms of dyspnea: Secondary | ICD-10-CM

## 2019-06-28 DIAGNOSIS — R0600 Dyspnea, unspecified: Secondary | ICD-10-CM

## 2019-06-28 DIAGNOSIS — J849 Interstitial pulmonary disease, unspecified: Secondary | ICD-10-CM

## 2019-06-28 NOTE — Patient Instructions (Addendum)
Medication Instructions:  *If you need a refill on your cardiac medications before your next appointment, please call your pharmacy*  Lab Work: If you have labs (blood work) drawn today and your tests are completely normal, you will receive your results only by: Marland Kitchen MyChart Message (if you have MyChart) OR . A paper copy in the mail If you have any lab test that is abnormal or we need to change your treatment, we will call you to review the results.  Testing/Procedures: Non-Contrast chest CT scanning, (CAT scanning), is a noninvasive, special x-ray that produces cross-sectional images of the body using x-rays and a computer. CT scans help physicians diagnose and treat medical conditions. For some CT exams, a contrast material is used to enhance visibility in the area of the body being studied. CT scans provide greater clarity and reveal more details than regular x-ray exams.  Your physician has requested that you have an echocardiogram in 1 year with. Echocardiography is a painless test that uses sound waves to create images of your heart. It provides your doctor with information about the size and shape of your heart and how well your heart's chambers and valves are working. This procedure takes approximately one hour. There are no restrictions for this procedure.  Follow-Up: At [facility], you and your health needs are our priority.  As part of our continuing mission to provide you with exceptional heart care, we have created designated Provider Care Teams.  These Care Teams include your primary Cardiologist (physician) and Advanced Practice Providers (APPs -  Physician Assistants and Nurse Practitioners) who all work together to provide you with the care you need, when you need it.  Your next appointment:   1 year(s)  The format for your next appointment:   In Person  Provider:   You may see Dr. Johnsie Cancel or one of the following Advanced Practice Providers on your designated Care Team:     Truitt Merle, NP  Cecilie Kicks, NP  Kathyrn Drown, NP

## 2019-07-03 ENCOUNTER — Telehealth: Payer: Self-pay | Admitting: Neurology

## 2019-07-03 ENCOUNTER — Other Ambulatory Visit: Payer: Self-pay

## 2019-07-03 MED ORDER — HYDROCODONE-ACETAMINOPHEN 5-325 MG PO TABS: 1.0000 | ORAL_TABLET | Freq: Four times a day (QID) | ORAL | 0 refills | Status: DC | PRN

## 2019-07-03 NOTE — Telephone Encounter (Signed)
1) Medication(s) Requested (by name): HYDROcodone-acetaminophen (NORCO/VICODIN) 5-325 MG tablet   2) Pharmacy of Choice: [facility] [address] ([address]), [place]  [id number] [place] ([place]) [place] [address]  Phone: [phone] Fax: [phone]

## 2019-07-03 NOTE — Telephone Encounter (Signed)
Drug registry check last refill June 05, 2019. Sent to DR [employer] [employer] work in [place].

## 2019-07-05 ENCOUNTER — Ambulatory Visit (HOSPITAL_COMMUNITY)
Admission: RE | Admit: 2019-07-05 | Discharge: 2019-07-05 | Disposition: A | Discharge status: Home / Self Care | Payer: Medicare Other | Source: Ambulatory Visit | Attending: Cardiovascular Disease | Admitting: Cardiovascular Disease

## 2019-07-05 ENCOUNTER — Other Ambulatory Visit: Payer: Self-pay

## 2019-07-05 DIAGNOSIS — R0609 Other forms of dyspnea: Secondary | ICD-10-CM

## 2019-07-05 DIAGNOSIS — J849 Interstitial pulmonary disease, unspecified: Secondary | ICD-10-CM

## 2019-07-05 DIAGNOSIS — R0600 Dyspnea, unspecified: Secondary | ICD-10-CM | POA: Diagnosis not present

## 2019-07-07 ENCOUNTER — Telehealth: Payer: Self-pay

## 2019-07-07 DIAGNOSIS — J479 Bronchiectasis, uncomplicated: Secondary | ICD-10-CM

## 2019-07-07 DIAGNOSIS — R0602 Shortness of breath: Secondary | ICD-10-CM

## 2019-07-07 NOTE — Telephone Encounter (Signed)
-----   Message from Josue Hector, MD sent at 07/06/2019  1:14 PM EST ----- CT shows bronchiectasis needs f/u CT in 3 months refer to pulmonary for this and dyspnea

## 2019-07-07 NOTE — Telephone Encounter (Signed)
Called patient with CT results. Will put in referral and follow up CT for patient.

## 2019-07-21 ENCOUNTER — Telehealth: Payer: Self-pay | Admitting: Critical Care Medicine

## 2019-07-21 ENCOUNTER — Other Ambulatory Visit: Payer: Self-pay

## 2019-07-21 ENCOUNTER — Ambulatory Visit (INDEPENDENT_AMBULATORY_CARE_PROVIDER_SITE_OTHER): Payer: Medicare Other | Admitting: Critical Care Medicine

## 2019-07-21 ENCOUNTER — Encounter: Payer: Self-pay | Admitting: Critical Care Medicine

## 2019-07-21 VITALS — BP 120/76 | HR 79 | Temp 97.0°F | Ht 64.0 in | Wt 144.6 lb

## 2019-07-21 DIAGNOSIS — J479 Bronchiectasis, uncomplicated: Secondary | ICD-10-CM

## 2019-07-21 DIAGNOSIS — R918 Other nonspecific abnormal finding of lung field: Secondary | ICD-10-CM

## 2019-07-21 DIAGNOSIS — J4550 Severe persistent asthma, uncomplicated: Secondary | ICD-10-CM

## 2019-07-21 LAB — COMPREHENSIVE METABOLIC PANEL
ALT: 15 U/L (ref 0–35)
AST: 18 U/L (ref 0–37)
Albumin: 4.2 g/dL (ref 3.5–5.2)
Alkaline Phosphatase: 48 U/L (ref 39–117)
BUN: 22 mg/dL (ref 6–23)
CO2: 26 mEq/L (ref 19–32)
Calcium: 9.9 mg/dL (ref 8.4–10.5)
Chloride: 103 mEq/L (ref 96–112)
Creatinine, Ser: 0.77 mg/dL (ref 0.40–1.20)
GFR: 72.3 mL/min (ref >60.00)
Glucose, Bld: 87 mg/dL (ref 70–99)
Potassium: 5 mEq/L (ref 3.5–5.1)
Sodium: 135 mEq/L (ref 135–145)
Total Bilirubin: 0.5 mg/dL (ref 0.2–1.2)
Total Protein: 7.1 g/dL (ref 6.0–8.3)

## 2019-07-21 LAB — CBC WITH DIFFERENTIAL/PLATELET
Basophils Absolute: 0 10*3/uL (ref 0.0–0.1)
Basophils Relative: 0.6 % (ref 0.0–3.0)
Eosinophils Absolute: 0.2 10*3/uL (ref 0.0–0.7)
Eosinophils Relative: 3.4 % (ref 0.0–5.0)
HCT: 30.5 % — ABNORMAL LOW (ref 36.0–46.0)
Hemoglobin: 10.4 g/dL — ABNORMAL LOW (ref 12.0–15.0)
Lymphocytes Relative: 30.7 % (ref 12.0–46.0)
Lymphs Abs: 1.6 10*3/uL (ref 0.7–4.0)
MCHC: 33.9 g/dL (ref 30.0–36.0)
MCV: 92.4 fl (ref 78.0–100.0)
Monocytes Absolute: 0.4 10*3/uL (ref 0.1–1.0)
Monocytes Relative: 8.5 % (ref 3.0–12.0)
Neutro Abs: 2.9 10*3/uL (ref 1.4–7.7)
Neutrophils Relative %: 56.8 % (ref 43.0–77.0)
Platelets: 345 10*3/uL (ref 150.0–400.0)
RBC: 3.3 Mil/uL — ABNORMAL LOW (ref 3.87–5.11)
RDW: 13.3 % (ref 11.5–15.5)
WBC: 5.1 10*3/uL (ref 4.0–10.5)

## 2019-07-21 MED ORDER — FERROUS GLUCONATE 324 (38 FE) MG PO TABS: 324.0000 mg | ORAL_TABLET | Freq: Two times a day (BID) | ORAL | 3 refills | Status: DC

## 2019-07-21 NOTE — Telephone Encounter (Signed)
Do you have any suggestions for which one to prescribe to get patient assistance? It seems like Janet Giles is the best choice?  LPC

## 2019-07-21 NOTE — H&P (View-Only) (Signed)
Synopsis: Referred in March 2021 for shortness of breath by Alroy Dust, L.Marlou Sa, MD.  Subjective:   PATIENT ID: Janet Giles GENDER: female DOB: 04-23-1941, MRN: [id number]  Chief Complaint  Patient presents with  . Consult    Patient is here to establish care for shortness of breath all the time with exertion making it worse. Patient has had it since 2015 and it has got worse. Patient did a PFT at [facility] in 2015 and was told she has asthma. Patient had a CT done on 2/24.     Janet Giles is a 79 year old woman who presents for evaluation of shortness of breath since 2015.  She has a history of chronic asthma, which has been worse recently.  She saw her cardiologist Dr. Johnsie Cancel on 06/28/2019-note reviewed.  He felt that her cardiac studies did not explain the degree of dyspnea she is having.  She has been taking Flovent twice daily for several years for asthma, but is unsure if it has ever improved her symptoms.  She uses albuterol several times per day with improvement in her symptoms, but seldom previously required it.  She has chest tightness, coughing, and wheezing, but no sputum production.  Her symptoms are worse first thing in the morning, but improve throughout the day.  She has no significant nighttime symptoms that wake her up.  She has no history of allergies.  No nasal congestion, postnasal drip, sneezing, soft palate or throat itching.  No fever, chills, sweats, weight loss, change in appetite.  She has significant fatigue all the time.  No history of bleeding or bruising, not taking any anticoagulation or antiplatelet medications.  She has a history of anemia.  She has a history of lupus, but is not currently on antirheumatic medications.  She follows with Dr. Estanislado Pandy from rheumatology.  Has a history of tobacco abuse, 25 years x 1.5 packs/day before quitting in 2002.      Past Medical History:  Diagnosis Date  . AAA (abdominal aortic aneurysm) ([place])   . ANA positive 08/06/2016   . 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 Asthma   . Complication of anesthesia    trouble breathing after back surgery  . 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 Carloyn Manner  . Degenerative joint disease of spine    buttocks pain -remains a problem  . Family history of adverse reaction to anesthesia    one sister nasea and vomiting  . Former smoker 08/13/2016  . GERD (gastroesophageal reflux disease)    occ  . High risk medication use 08/06/2016  . 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 Hyperlipidemia   . Hypothyroidism   . Idiopathic peripheral neuropathy 08/13/2016  . Lupus ([place])    Dr. Aura Fey  . Neuromuscular disorder ([facility])    neuropathy in feet  . PONV (postoperative nausea and vomiting)   . Primary osteoarthritis of both feet 08/06/2016  . Primary osteoarthritis of both hands 08/06/2016  . Primary osteoarthritis of right hip 03/24/2015  . Raynaud's disease without gangrene 08/06/2016  . Shortness of breath dyspnea    uses inhaler occ     Family History  Problem Relation Age of Onset  . Diabetes Mother   . Heart disease Mother   . Heart attack Mother   . Bleeding Disorder Father   . Heart disease Sister   . Heart attack Sister   . Cancer Sister   . Deep vein thrombosis Sister   . Diabetes Sister   .  Hyperlipidemia Sister   . Hypertension Sister   . Varicose Veins Sister   . AAA (abdominal aortic aneurysm) Sister   . Bleeding Disorder Sister   . Breast cancer Sister   . Heart disease Brother   . Heart attack Brother   . Hypertension Brother      Past Surgical History:  Procedure Laterality Date  . ABDOMINAL HYSTERECTOMY    . BACK SURGERY  02   L4-L5- fusion  . COLONOSCOPY WITH PROPOFOL N/A 03/17/2016   Procedure: COLONOSCOPY WITH PROPOFOL;  Surgeon: Garlan Fair, MD;  Location: [facility];  Service: Endoscopy;  Laterality: N/A;  . ESOPHAGOGASTRODUODENOSCOPY (EGD) WITH PROPOFOL N/A 03/17/2016   Procedure:  ESOPHAGOGASTRODUODENOSCOPY (EGD) WITH PROPOFOL;  Surgeon: Garlan Fair, MD;  Location: [facility];  Service: Endoscopy;  Laterality: N/A;  . JOINT REPLACEMENT    . TONSILLECTOMY AND ADENOIDECTOMY    . TOTAL HIP ARTHROPLASTY Right 03/25/2015   Procedure: TOTAL HIP ARTHROPLASTY ANTERIOR APPROACH;  Surgeon: Frederik Pear, MD;  Location: [place];  Service: Orthopedics;  Laterality: Right;    Social History   Socioeconomic History  . Marital status: Married    Spouse name: Not on file  . Number of children: 3  . Years of education: Not on file  . Highest education level: Some college, no degree  Occupational History  . Occupation: Retired  Tobacco Use  . Smoking status: Former Smoker    Packs/day: 1.50    Years: 35.00    Pack years: 52.50    Types: Cigarettes    Quit date: 05/11/2000    Years since quitting: 19.2  . Smokeless tobacco: Never Used  Substance and Sexual Activity  . Alcohol use: Never    Alcohol/week: 0.0 standard drinks  . Drug use: Never  . Sexual activity: Not on file  Other Topics Concern  . Not on file  Social History Narrative   Right handed   2 cups of caffeine daily   Lives at home with husband and granddaughter    Social Determinants of Health   Financial Resource Strain:   . Difficulty of Paying Living Expenses:   Food Insecurity:   . Worried About Charity fundraiser in the Last Year:   . Arboriculturist in the Last Year:   Transportation Needs:   . Film/video editor (Medical):   Marland Kitchen Lack of Transportation (Non-Medical):   Physical Activity:   . Days of Exercise per Week:   . Minutes of Exercise per Session:   Stress:   . Feeling of Stress :   Social Connections:   . Frequency of Communication with Friends and Family:   . Frequency of Social Gatherings with Friends and Family:   . Attends Religious Services:   . Active Member of Clubs or Organizations:   . Attends Archivist Meetings:   Marland Kitchen Marital Status:   Intimate Partner  Violence:   . Fear of Current or Ex-Partner:   . Emotionally Abused:   Marland Kitchen Physically Abused:   . Sexually Abused:      Allergies  Allergen Reactions  . Wasp Venom Shortness Of Breath and Swelling  . Doxycycline Nausea And Vomiting     Immunization History  Administered Date(s) Administered  . Fluad Quad(high Dose 65+) 02/20/2019    Outpatient Medications Prior to Visit  Medication Sig Dispense Refill  . albuterol (PROVENTIL HFA;VENTOLIN HFA) 108 (90 BASE) MCG/ACT inhaler Inhale 1 puff into the lungs every 6 (six) hours as needed  for wheezing or shortness of breath.    . Calcium Carbonate-Vitamin D (CALCIUM + D PO) Take 1 tablet by mouth 2 (two) times daily.    . cephALEXin (KEFLEX) 500 MG capsule Take 1 capsule (500 mg total) by mouth 4 (four) times daily. 28 capsule 0  . [employer] BIOTIN PO Take 1 tablet by mouth daily. 10000 mcg daily bedtime    . FLOVENT HFA 110 MCG/ACT inhaler     . gabapentin (NEURONTIN) 600 MG tablet Take 600 mg by mouth 2 (two) times daily.    Marland Kitchen HYDROcodone-acetaminophen (NORCO/VICODIN) 5-325 MG tablet Take 1 tablet by mouth every 6 (six) hours as needed for moderate pain. Must last 28 days 60 tablet 0  . levothyroxine (SYNTHROID, LEVOTHROID) 75 MCG tablet Take 1 tablet by mouth daily.    . meloxicam (MOBIC) 7.5 MG tablet Take 1 tablet (7.5 mg total) by mouth daily. 30 tablet 3  . vitamin B-12 (CYANOCOBALAMIN) 1000 MCG tablet Take by mouth.     No facility-administered medications prior to visit.    Review of Systems  Constitutional: Positive for malaise/fatigue. Negative for chills, fever and weight loss.  HENT: Negative for congestion.        Hoarseness  Respiratory: Positive for cough and shortness of breath. Negative for sputum production.   Cardiovascular: Negative for chest pain and leg swelling.  Endo/Heme/Allergies: Negative for environmental allergies. Does not bruise/bleed easily.     Objective:   Vitals:   07/21/19 1029  BP: 120/76  Pulse:  79  Temp: (!) 97 F (36.1 C)  TempSrc: Temporal  SpO2: 93%  Weight: 144 lb 9.6 oz (65.6 kg)  Height: _0  (1.626 m)     on   RA BMI Readings from Last 3 Encounters:  06/28/19 25.23 kg/m  04/27/19 24.37 kg/m  04/19/19 24.94 kg/m   Wt Readings from Last 3 Encounters:  06/28/19 147 lb (66.7 kg)  04/27/19 142 lb (64.4 kg)  04/19/19 145 lb 5 oz (65.9 kg)    Physical Exam Vitals reviewed.  Constitutional:      Appearance: Normal appearance.  HENT:     Head: Normocephalic and atraumatic.  Eyes:     General: No scleral icterus. Cardiovascular:     Rate and Rhythm: Normal rate and regular rhythm.  Pulmonary:     Comments: Prolonged exhalation, no wheezing or rhales. Breathing comfortably on RA. No conversational dyspnea. Abdominal:     General: There is no distension.     Palpations: Abdomen is soft.     Tenderness: There is no abdominal tenderness.  Musculoskeletal:        General: No swelling or deformity.     Cervical back: Neck supple.  Lymphadenopathy:     Cervical: No cervical adenopathy.  Skin:    General: Skin is warm and dry.     Findings: No rash.  Neurological:     General: No focal deficit present.     Mental Status: She is alert.     Motor: No weakness.     Coordination: Coordination normal.  Psychiatric:        Mood and Affect: Mood normal.      CBC    Component Value Date/Time   WBC 6.4 02/14/2018 1134   RBC 3.54 (L) 02/14/2018 1134   HGB 10.9 (L) 02/14/2018 1134   HCT 32.4 (L) 02/14/2018 1134   PLT 353 02/14/2018 1134   MCV 91.5 02/14/2018 1134   MCH 30.8 02/14/2018 1134   MCHC 33.6  02/14/2018 1134   RDW 12.1 02/14/2018 1134   LYMPHSABS 2,061 02/14/2018 1134   MONOABS 552 08/17/2016 1150   EOSABS 192 02/14/2018 1134   BASOSABS 32 02/14/2018 1134    CHEMISTRY No results for input(s): NA, K, CL, CO2, GLUCOSE, BUN, CREATININE, CALCIUM, MG, PHOS in the last 168 hours. CrCl cannot be calculated (Patient's most recent lab result is older  than the maximum 21 days allowed.).   06/27/2018 labs: IgG 1030 IgM 71 IgA 253  02/14/2018 ANA positive 1: 40, nuclear homogenous pattern dsDNA antibody normal C3  & C4 normal  Chest Imaging- films reviewed: CT chest 07/05/2019-mild bronchiectasis in all lobes, right upper lobe peripheral tree-in-bud opacities, associated micronodules.  No significant nodularity in RML, minimal distal inferior lingular nodules.  Linear scarring bilateral lower lobes.  Borderline mediastinal adenopathy.  Pulmonary Functions Testing Results: PFT Results Latest Ref Rng & Units 01/09/2014  FVC-Pre L 2.01  FVC-Predicted Pre % 69  FVC-Post L 2.33  FVC-Predicted Post % 81  Pre FEV1/FVC % % 63  Post FEV1/FCV % % 60  FEV1-Pre L 1.26  FEV1-Predicted Pre % 58  FEV1-Post L 1.39  DLCO UNC% % 48  DLCO COR %Predicted % 69  TLC L 4.45  TLC % Predicted % 88  RV % Predicted % 105   2015- moderate obstruction with bronchodilator reversibility.  No air trapping or hyperinflation.  Moderate diffusion impairment.   Echocardiogram 10/06/2018: LVEF 55 to 71%, grade 1 diastolic dysfunction with elevated LVEDP.  Normal LA, RV, RA.  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  Mild MR.      Assessment & Plan:     ICD-10-CM   1. Bronchiectasis without complication ([place])  G62.6   2. Abnormal CT scan, lung  R91.8   3. Severe persistent asthma without complication  R48.54      Dyspnea on exertion, likely severe persistent asthma -Recommend escalating therapy to LABA/ICS.  We have asked pharmacy for assistance to determine the most affordable inhaler.  Appreciate their recommendations. -Stop Flovent once starting combination inhaler -Continue albuterol as needed -CBC with differential, IgE, CMP -Repeat PFTs -Recommend Covid vaccine.  Up-to-date on seasonal flu vaccine  Abnormal CT concerning for chronic infection, multi- lobar bronchiectasis -Recommend obtaining sputum  cultures.  If she is unable to expectorate sputum, would recommend bronchoscopy to obtain adequate samples.  We discussed the risk, benefits, alternatives to bronchoscopy with BAL.  She is willing to proceed.  Normocytic anemia -Recommend iron supplements twice daily -Defer to PCP for additional work-up  RTC in 6 weeks.   Current Outpatient Medications:  .  albuterol (PROVENTIL HFA;VENTOLIN HFA) 108 (90 BASE) MCG/ACT inhaler, Inhale 1 puff into the lungs every 6 (six) hours as needed for wheezing or shortness of breath., Disp: , Rfl:  .  Calcium Carbonate-Vitamin D (CALCIUM + D PO), Take 1 tablet by mouth 2 (two) times daily., Disp: , Rfl:  .  cephALEXin (KEFLEX) 500 MG capsule, Take 1 capsule (500 mg total) by mouth 4 (four) times daily., Disp: 28 capsule, Rfl: 0 .  [employer] BIOTIN PO, Take 1 tablet by mouth daily. 10000 mcg daily bedtime, Disp: , Rfl:  .  FLOVENT HFA 110 MCG/ACT inhaler, , Disp: , Rfl:  .  gabapentin (NEURONTIN) 600 MG tablet, Take 600 mg by mouth 2 (two) times daily., Disp: , Rfl:  .  HYDROcodone-acetaminophen (NORCO/VICODIN) 5-325 MG tablet, Take 1 tablet by mouth every 6 (six) hours as needed for moderate  pain. Must last 28 days, Disp: 60 tablet, Rfl: 0 .  levothyroxine (SYNTHROID, LEVOTHROID) 75 MCG tablet, Take 1 tablet by mouth daily., Disp: , Rfl:  .  meloxicam (MOBIC) 7.5 MG tablet, Take 1 tablet (7.5 mg total) by mouth daily., Disp: 30 tablet, Rfl: 3 .  vitamin B-12 (CYANOCOBALAMIN) 1000 MCG tablet, Take by mouth., Disp: , Rfl:     Julian Hy, DO [facility] 07/21/2019 10:31 AM

## 2019-07-21 NOTE — Telephone Encounter (Signed)
Findings of benefits investigation via test claims at [facility]:  Insurance: [employer] Medicare   Advair Diskus - # 1 inhaler for a 1 month supply through patient's insurance is $ 414.13.  Symbicort - # 1 inhaler for a 1 month supply through patient's insurance is $ 390.74.  Advair HFA - # 1 inhaler for a 1 month supply through patient's insurance is $ 426.54.  Wixela - # 1 inhaler for a 1 month supply through patient's insurance is $ 109.70.  *Patient has a $428.09 unmet deductible that is applying to all claims. There are no grants open at this time.   Patient assistance for the chosen inhaler likely the best option for patient, if she unable to afford.   Attempted to call patient, she was not available.  1:39 PM Beatriz Chancellor, CPhT

## 2019-07-21 NOTE — Progress Notes (Signed)
 Synopsis: Referred in March 2021 for shortness of breath by Mitchell, L.Dean, MD.  Subjective:   PATIENT ID: Janet Giles GENDER: female DOB: 03/27/1941, MRN: [id number]  Chief Complaint  Patient presents with  . Consult    Patient is here to establish care for shortness of breath all the time with exertion making it worse. Patient has had it since 2015 and it has got worse. Patient did a PFT at [facility] in 2015 and was told she has asthma. Patient had a CT done on 2/24.     Janet Giles is a 79-year-old woman who presents for evaluation of shortness of breath since 2015.  She has a history of chronic asthma, which has been worse recently.  She saw her cardiologist Dr. Nishan on 06/28/2019-note reviewed.  He felt that her cardiac studies did not explain the degree of dyspnea she is having.  She has been taking Flovent twice daily for several years for asthma, but is unsure if it has ever improved her symptoms.  She uses albuterol several times per day with improvement in her symptoms, but seldom previously required it.  She has chest tightness, coughing, and wheezing, but no sputum production.  Her symptoms are worse first thing in the morning, but improve throughout the day.  She has no significant nighttime symptoms that wake her up.  She has no history of allergies.  No nasal congestion, postnasal drip, sneezing, soft palate or throat itching.  No fever, chills, sweats, weight loss, change in appetite.  She has significant fatigue all the time.  No history of bleeding or bruising, not taking any anticoagulation or antiplatelet medications.  She has a history of anemia.  She has a history of lupus, but is not currently on antirheumatic medications.  She follows with Dr. Deveshwar from rheumatology.  Has a history of tobacco abuse, 25 years x 1.5 packs/day before quitting in 2002.      Past Medical History:  Diagnosis Date  . AAA (abdominal aortic aneurysm) (HCC)   . ANA positive 08/06/2016   . 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 Asthma   . Complication of anesthesia    trouble breathing after back surgery  . 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 Roy  . Degenerative joint disease of spine    buttocks pain -remains a problem  . Family history of adverse reaction to anesthesia    one sister nasea and vomiting  . Former smoker 08/13/2016  . GERD (gastroesophageal reflux disease)    occ  . High risk medication use 08/06/2016  . 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 Hyperlipidemia   . Hypothyroidism   . Idiopathic peripheral neuropathy 08/13/2016  . Lupus (HCC)    Dr. Deveshwar,rheumatology  . Neuromuscular disorder (HCC)    neuropathy in feet  . PONV (postoperative nausea and vomiting)   . Primary osteoarthritis of both feet 08/06/2016  . Primary osteoarthritis of both hands 08/06/2016  . Primary osteoarthritis of right hip 03/24/2015  . Raynaud's disease without gangrene 08/06/2016  . Shortness of breath dyspnea    uses inhaler occ     Family History  Problem Relation Age of Onset  . Diabetes Mother   . Heart disease Mother   . Heart attack Mother   . Bleeding Disorder Father   . Heart disease Sister   . Heart attack Sister   . Cancer Sister   . Deep vein thrombosis Sister   . Diabetes Sister   .   Hyperlipidemia Sister   . Hypertension Sister   . Varicose Veins Sister   . AAA (abdominal aortic aneurysm) Sister   . Bleeding Disorder Sister   . Breast cancer Sister   . Heart disease Brother   . Heart attack Brother   . Hypertension Brother      Past Surgical History:  Procedure Laterality Date  . ABDOMINAL HYSTERECTOMY    . BACK SURGERY  02   L4-L5- fusion  . COLONOSCOPY WITH PROPOFOL N/A 03/17/2016   Procedure: COLONOSCOPY WITH PROPOFOL;  Surgeon: Garlan Fair, MD;  Location: [facility];  Service: Endoscopy;  Laterality: N/A;  . ESOPHAGOGASTRODUODENOSCOPY (EGD) WITH PROPOFOL N/A 03/17/2016   Procedure:  ESOPHAGOGASTRODUODENOSCOPY (EGD) WITH PROPOFOL;  Surgeon: Garlan Fair, MD;  Location: [facility];  Service: Endoscopy;  Laterality: N/A;  . JOINT REPLACEMENT    . TONSILLECTOMY AND ADENOIDECTOMY    . TOTAL HIP ARTHROPLASTY Right 03/25/2015   Procedure: TOTAL HIP ARTHROPLASTY ANTERIOR APPROACH;  Surgeon: Frederik Pear, MD;  Location: [place];  Service: Orthopedics;  Laterality: Right;    Social History   Socioeconomic History  . Marital status: Married    Spouse name: Not on file  . Number of children: 3  . Years of education: Not on file  . Highest education level: Some college, no degree  Occupational History  . Occupation: Retired  Tobacco Use  . Smoking status: Former Smoker    Packs/day: 1.50    Years: 35.00    Pack years: 52.50    Types: Cigarettes    Quit date: 05/11/2000    Years since quitting: 19.2  . Smokeless tobacco: Never Used  Substance and Sexual Activity  . Alcohol use: Never    Alcohol/week: 0.0 standard drinks  . Drug use: Never  . Sexual activity: Not on file  Other Topics Concern  . Not on file  Social History Narrative   Right handed   2 cups of caffeine daily   Lives at home with husband and granddaughter    Social Determinants of Health   Financial Resource Strain:   . Difficulty of Paying Living Expenses:   Food Insecurity:   . Worried About Charity fundraiser in the Last Year:   . Arboriculturist in the Last Year:   Transportation Needs:   . Film/video editor (Medical):   Marland Kitchen Lack of Transportation (Non-Medical):   Physical Activity:   . Days of Exercise per Week:   . Minutes of Exercise per Session:   Stress:   . Feeling of Stress :   Social Connections:   . Frequency of Communication with Friends and Family:   . Frequency of Social Gatherings with Friends and Family:   . Attends Religious Services:   . Active Member of Clubs or Organizations:   . Attends Archivist Meetings:   Marland Kitchen Marital Status:   Intimate Partner  Violence:   . Fear of Current or Ex-Partner:   . Emotionally Abused:   Marland Kitchen Physically Abused:   . Sexually Abused:      Allergies  Allergen Reactions  . Wasp Venom Shortness Of Breath and Swelling  . Doxycycline Nausea And Vomiting     Immunization History  Administered Date(s) Administered  . Fluad Quad(high Dose 65+) 02/20/2019    Outpatient Medications Prior to Visit  Medication Sig Dispense Refill  . albuterol (PROVENTIL HFA;VENTOLIN HFA) 108 (90 BASE) MCG/ACT inhaler Inhale 1 puff into the lungs every 6 (six) hours as needed  for wheezing or shortness of breath.    . Calcium Carbonate-Vitamin D (CALCIUM + D PO) Take 1 tablet by mouth 2 (two) times daily.    . cephALEXin (KEFLEX) 500 MG capsule Take 1 capsule (500 mg total) by mouth 4 (four) times daily. 28 capsule 0  . [employer]S BIOTIN PO Take 1 tablet by mouth daily. 10000 mcg daily bedtime    . FLOVENT HFA 110 MCG/ACT inhaler     . gabapentin (NEURONTIN) 600 MG tablet Take 600 mg by mouth 2 (two) times daily.    Marland Kitchen HYDROcodone-acetaminophen (NORCO/VICODIN) 5-325 MG tablet Take 1 tablet by mouth every 6 (six) hours as needed for moderate pain. Must last 28 days 60 tablet 0  . levothyroxine (SYNTHROID, LEVOTHROID) 75 MCG tablet Take 1 tablet by mouth daily.    . meloxicam (MOBIC) 7.5 MG tablet Take 1 tablet (7.5 mg total) by mouth daily. 30 tablet 3  . vitamin B-12 (CYANOCOBALAMIN) 1000 MCG tablet Take by mouth.     No facility-administered medications prior to visit.    Review of Systems  Constitutional: Positive for malaise/fatigue. Negative for chills, fever and weight loss.  HENT: Negative for congestion.        Hoarseness  Respiratory: Positive for cough and shortness of breath. Negative for sputum production.   Cardiovascular: Negative for chest pain and leg swelling.  Endo/Heme/Allergies: Negative for environmental allergies. Does not bruise/bleed easily.     Objective:   Vitals:   07/21/19 1029  BP: 120/76  Pulse:  79  Temp: (!) 97 F (36.1 C)  TempSrc: Temporal  SpO2: 93%  Weight: 144 lb 9.6 oz (65.6 kg)  Height: _0  (1.626 m)     on   RA BMI Readings from Last 3 Encounters:  06/28/19 25.23 kg/m  04/27/19 24.37 kg/m  04/19/19 24.94 kg/m   Wt Readings from Last 3 Encounters:  06/28/19 147 lb (66.7 kg)  04/27/19 142 lb (64.4 kg)  04/19/19 145 lb 5 oz (65.9 kg)    Physical Exam Vitals reviewed.  Constitutional:      Appearance: Normal appearance.  HENT:     Head: Normocephalic and atraumatic.  Eyes:     General: No scleral icterus. Cardiovascular:     Rate and Rhythm: Normal rate and regular rhythm.  Pulmonary:     Comments: Prolonged exhalation, no wheezing or rhales. Breathing comfortably on RA. No conversational dyspnea. Abdominal:     General: There is no distension.     Palpations: Abdomen is soft.     Tenderness: There is no abdominal tenderness.  Musculoskeletal:        General: No swelling or deformity.     Cervical back: Neck supple.  Lymphadenopathy:     Cervical: No cervical adenopathy.  Skin:    General: Skin is warm and dry.     Findings: No rash.  Neurological:     General: No focal deficit present.     Mental Status: She is alert.     Motor: No weakness.     Coordination: Coordination normal.  Psychiatric:        Mood and Affect: Mood normal.      CBC    Component Value Date/Time   WBC 6.4 02/14/2018 1134   RBC 3.54 (L) 02/14/2018 1134   HGB 10.9 (L) 02/14/2018 1134   HCT 32.4 (L) 02/14/2018 1134   PLT 353 02/14/2018 1134   MCV 91.5 02/14/2018 1134   MCH 30.8 02/14/2018 1134   MCHC 33.6  02/14/2018 1134   RDW 12.1 02/14/2018 1134   LYMPHSABS 2,061 02/14/2018 1134   MONOABS 552 08/17/2016 1150   EOSABS 192 02/14/2018 1134   BASOSABS 32 02/14/2018 1134    CHEMISTRY No results for input(s): NA, K, CL, CO2, GLUCOSE, BUN, CREATININE, CALCIUM, MG, PHOS in the last 168 hours. CrCl cannot be calculated (Patient's most recent lab result is older  than the maximum 21 days allowed.).   06/27/2018 labs: IgG 1030 IgM 71 IgA 253  02/14/2018 ANA positive 1: 40, nuclear homogenous pattern dsDNA antibody normal C3  & C4 normal  Chest Imaging- films reviewed: CT chest 07/05/2019-mild bronchiectasis in all lobes, right upper lobe peripheral tree-in-bud opacities, associated micronodules.  No significant nodularity in RML, minimal distal inferior lingular nodules.  Linear scarring bilateral lower lobes.  Borderline mediastinal adenopathy.  Pulmonary Functions Testing Results: PFT Results Latest Ref Rng & Units 01/09/2014  FVC-Pre L 2.01  FVC-Predicted Pre % 69  FVC-Post L 2.33  FVC-Predicted Post % 81  Pre FEV1/FVC % % 63  Post FEV1/FCV % % 60  FEV1-Pre L 1.26  FEV1-Predicted Pre % 58  FEV1-Post L 1.39  DLCO UNC% % 48  DLCO COR %Predicted % 69  TLC L 4.45  TLC % Predicted % 88  RV % Predicted % 105   2015- moderate obstruction with bronchodilator reversibility.  No air trapping or hyperinflation.  Moderate diffusion impairment.   Echocardiogram 10/06/2018: LVEF 55 to 60%, grade 1 diastolic dysfunction with elevated LVEDP.  Normal LA, RV, RA.  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  Mild MR.      Assessment & Plan:     ICD-10-CM   1. Bronchiectasis without complication (HCC)  J47.9   2. Abnormal CT scan, lung  R91.8   3. Severe persistent asthma without complication  J45.50      Dyspnea on exertion, likely severe persistent asthma -Recommend escalating therapy to LABA/ICS.  We have asked pharmacy for assistance to determine the most affordable inhaler.  Appreciate their recommendations. -Stop Flovent once starting combination inhaler -Continue albuterol as needed -CBC with differential, IgE, CMP -Repeat PFTs -Recommend Covid vaccine.  Up-to-date on seasonal flu vaccine  Abnormal CT concerning for chronic infection, multi- lobar bronchiectasis -Recommend obtaining sputum  cultures.  If she is unable to expectorate sputum, would recommend bronchoscopy to obtain adequate samples.  We discussed the risk, benefits, alternatives to bronchoscopy with BAL.  She is willing to proceed.  Normocytic anemia -Recommend iron supplements twice daily -Defer to PCP for additional work-up  RTC in 6 weeks.   Current Outpatient Medications:  .  albuterol (PROVENTIL HFA;VENTOLIN HFA) 108 (90 BASE) MCG/ACT inhaler, Inhale 1 puff into the lungs every 6 (six) hours as needed for wheezing or shortness of breath., Disp: , Rfl:  .  Calcium Carbonate-Vitamin D (CALCIUM + D PO), Take 1 tablet by mouth 2 (two) times daily., Disp: , Rfl:  .  cephALEXin (KEFLEX) 500 MG capsule, Take 1 capsule (500 mg total) by mouth 4 (four) times daily., Disp: 28 capsule, Rfl: 0 .  CVS BIOTIN PO, Take 1 tablet by mouth daily. 10000 mcg daily bedtime, Disp: , Rfl:  .  FLOVENT HFA 110 MCG/ACT inhaler, , Disp: , Rfl:  .  gabapentin (NEURONTIN) 600 MG tablet, Take 600 mg by mouth 2 (two) times daily., Disp: , Rfl:  .  HYDROcodone-acetaminophen (NORCO/VICODIN) 5-325 MG tablet, Take 1 tablet by mouth every 6 (six) hours as needed for moderate   pain. Must last 28 days, Disp: 60 tablet, Rfl: 0 .  levothyroxine (SYNTHROID, LEVOTHROID) 75 MCG tablet, Take 1 tablet by mouth daily., Disp: , Rfl:  .  meloxicam (MOBIC) 7.5 MG tablet, Take 1 tablet (7.5 mg total) by mouth daily., Disp: 30 tablet, Rfl: 3 .  vitamin B-12 (CYANOCOBALAMIN) 1000 MCG tablet, Take by mouth., Disp: , Rfl:     Janet Nightengale P Edon Hoadley, DO [facility] 07/21/2019 10:31 AM  

## 2019-07-21 NOTE — Telephone Encounter (Signed)
Ok we will just need to keep trying to call her to let her know the situation with her deductible. Thanks for your help!  LPC

## 2019-07-21 NOTE — Telephone Encounter (Signed)
Pt returning a phone call. Pt can be reached at [phone]. Pt is asking to be called back on Monday 3.15.21 because she is leaving out and will not be by a phone

## 2019-07-21 NOTE — Progress Notes (Signed)
Please let her know that she has ongoing anemia and likely would benefit from iron supplements, which have been prescribed.

## 2019-07-21 NOTE — Telephone Encounter (Signed)
Patient needs assistance with which LABA ICS is covered by her insurance. If you could please look into this and contact the patient so we can get her set up. Thank you

## 2019-07-21 NOTE — Patient Instructions (Addendum)
Thank you for visiting Dr. Carlis Abbott at [facility]. We recommend the following: Orders Placed This Encounter  Procedures  . CBC w/Diff  . IgE  . Comp Met (CMET)  . Pulmonary function test   Orders Placed This Encounter  Procedures  . CBC w/Diff    Standing Status:   Future    Standing Expiration Date:   07/20/2020  . IgE    Standing Status:   Future    Standing Expiration Date:   07/20/2020  . Comp Met (CMET)    Standing Status:   Future    Standing Expiration Date:   07/20/2020  . Pulmonary function test    Standing Status:   Future    Standing Expiration Date:   07/20/2020    Order Specific Question:   Where should this test be performed?    Answer:   [facility]    Order Specific Question:   Full PFT: includes the following: basic spirometry, spirometry pre & post bronchodilator, diffusion capacity (DLCO), lung volumes    Answer:   Full PFT    Pharmacy team will call you next week to discuss inhalers. When you start the new inhaler that we prescribe after you talk to them, you will stop your Flovent.  The procedure you need is called a bronchoscopy- this will let us go into the lungs to get cultures to find out if you have a chronic lung infection. This will be done under light anesthesia so you will need a ride to the hospital that day. You will be covid tested 3 days before.    Return in about 6 weeks (around 09/01/2019).    Please do your part to reduce the spread of COVID-19.

## 2019-07-24 LAB — IGE: IgE (Immunoglobulin E), Serum: 82 kU/L (ref <=114)

## 2019-07-24 NOTE — Telephone Encounter (Signed)
[facility] [employer] is the cheapest option, but does not appear to have a patient assistance program. This may be the best option if patient will not qualify for patient assistance.   Advair, Memory Dance- are available through [place] patient assistance. Symbicort is available through [place]. Both programs require patient to fall within certain income guidelines and meet certain out of pocket spending on prescriptions for the year, GSK-$600 or AZ&ME 3% of income.  8:29 AM Beatriz Chancellor, CPhT

## 2019-07-24 NOTE — Telephone Encounter (Signed)
Can you figure out how the best way to answer if this patient will qualify for patient assistance so we can prescribe her an inhaler?  I guess just ask her monthly income? Thanks!  LPC

## 2019-07-25 ENCOUNTER — Telehealth: Payer: Self-pay | Admitting: Critical Care Medicine

## 2019-07-25 NOTE — Telephone Encounter (Signed)
ATC pt, no answer. Left message for pt to call back.  LC do you have any information on when the bronch will be done so when pt calls back I can let her know.

## 2019-07-25 NOTE — Telephone Encounter (Signed)
Patient is returning phone call.  Patient phone number is [phone].

## 2019-07-25 NOTE — Addendum Note (Signed)
Addended by: Lia Foyer R on: 07/25/2019 12:11 PM   Modules accepted: Orders

## 2019-07-25 NOTE — Telephone Encounter (Signed)
Will await response from [facility]

## 2019-07-26 NOTE — Telephone Encounter (Signed)
Patient is scheduled for bronch on 08/07/19 with covid testing on 08/04/19.  Called spoke with patient to ask about her questions. patient stated that she was wondering about the date/time, arrival time for the bronch and she has received all this information.  Patient also asked if the covid testing is necessary before the bronch and the pft if she has already had both her covid vaccines.  Advised patient that at this time, yes the testing still needs to be completed.  She voiced her understanding.  Nothing further needed; will sign off.

## 2019-08-02 ENCOUNTER — Other Ambulatory Visit: Payer: Self-pay | Admitting: Neurology

## 2019-08-02 MED ORDER — HYDROCODONE-ACETAMINOPHEN 5-325 MG PO TABS: 1.0000 | ORAL_TABLET | Freq: Four times a day (QID) | ORAL | 0 refills | Status: DC | PRN

## 2019-08-02 NOTE — Telephone Encounter (Signed)
Drug registry last refill 07/03/2019 by provider at [facility]. Pt needs refill.Dr.Willis pt.

## 2019-08-02 NOTE — Telephone Encounter (Signed)
Pt is needing a refill on her HYDROcodone-acetaminophen (NORCO/VICODIN) 5-325 MG tablet sent in to the [facility] on [address]

## 2019-08-03 NOTE — Telephone Encounter (Signed)
Can you please see if the patient will qualify for patient assistance.  Thank you

## 2019-08-04 ENCOUNTER — Other Ambulatory Visit (HOSPITAL_COMMUNITY)
Admission: RE | Admit: 2019-08-04 | Discharge: 2019-08-04 | Disposition: A | Discharge status: Home / Self Care | Payer: Medicare Other | Source: Ambulatory Visit | Attending: Critical Care Medicine | Admitting: Critical Care Medicine

## 2019-08-04 DIAGNOSIS — Z20822 Contact with and (suspected) exposure to covid-19: Secondary | ICD-10-CM | POA: Diagnosis not present

## 2019-08-04 DIAGNOSIS — Z01812 Encounter for preprocedural laboratory examination: Secondary | ICD-10-CM | POA: Diagnosis not present

## 2019-08-04 LAB — SARS CORONAVIRUS 2 (TAT 6-24 HRS): SARS Coronavirus 2: NEGATIVE

## 2019-08-04 NOTE — Telephone Encounter (Signed)
Spoke with the pt  She will have her spouse come and pick up the forms for pt assistance and will drop off once her portion is done  Then we will fill out our part and fax to [facility] with rx

## 2019-08-04 NOTE — Telephone Encounter (Signed)
Symbicort sounds great to me. Thanks!  LPC

## 2019-08-04 NOTE — Telephone Encounter (Signed)
PT called back, please return call 

## 2019-08-04 NOTE — Telephone Encounter (Signed)
Attempted to call pt but unable to reach. Left message for pt to return call. 

## 2019-08-04 NOTE — Telephone Encounter (Signed)
We do not handle patient assistance for inhalers. Provider needs to determine which inhaler she would like patient to be on.  My recommendation would be Symbicort as AZ&ME as we just found out they are not requiring specific OOP spending for 2021.  The patient may have insurance and an income at or below $35,000 for an individual; $48,000 for a couple; $60,000 for a family of three; $70,000 for a family of four to qualify.  Mariella Saa, PharmD, [place], [place] [facility] Pharmacist [phone]  08/04/2019 8:09 AM

## 2019-08-07 ENCOUNTER — Ambulatory Visit (HOSPITAL_COMMUNITY): Payer: Medicare Other | Admitting: Certified Registered Nurse Anesthetist

## 2019-08-07 ENCOUNTER — Ambulatory Visit (HOSPITAL_COMMUNITY)
Admission: RE | Admit: 2019-08-07 | Discharge: 2019-08-07 | Disposition: A | Discharge status: Home / Self Care | Payer: Medicare Other | Source: Ambulatory Visit | Attending: Critical Care Medicine | Admitting: Critical Care Medicine

## 2019-08-07 ENCOUNTER — Encounter (HOSPITAL_COMMUNITY): Payer: Self-pay | Admitting: Critical Care Medicine

## 2019-08-07 ENCOUNTER — Encounter (HOSPITAL_COMMUNITY)
Admission: RE | Disposition: A | Discharge status: Home / Self Care | Payer: Self-pay | Source: Ambulatory Visit | Attending: Critical Care Medicine

## 2019-08-07 DIAGNOSIS — J479 Bronchiectasis, uncomplicated: Secondary | ICD-10-CM

## 2019-08-07 DIAGNOSIS — E039 Hypothyroidism, unspecified: Secondary | ICD-10-CM | POA: Insufficient documentation

## 2019-08-07 DIAGNOSIS — I7300 Raynaud's syndrome without gangrene: Secondary | ICD-10-CM | POA: Insufficient documentation

## 2019-08-07 DIAGNOSIS — Z981 Arthrodesis status: Secondary | ICD-10-CM | POA: Insufficient documentation

## 2019-08-07 DIAGNOSIS — M329 Systemic lupus erythematosus, unspecified: Secondary | ICD-10-CM | POA: Insufficient documentation

## 2019-08-07 DIAGNOSIS — K219 Gastro-esophageal reflux disease without esophagitis: Secondary | ICD-10-CM | POA: Insufficient documentation

## 2019-08-07 DIAGNOSIS — Z87891 Personal history of nicotine dependence: Secondary | ICD-10-CM | POA: Insufficient documentation

## 2019-08-07 DIAGNOSIS — E785 Hyperlipidemia, unspecified: Secondary | ICD-10-CM | POA: Diagnosis not present

## 2019-08-07 DIAGNOSIS — M1611 Unilateral primary osteoarthritis, right hip: Secondary | ICD-10-CM | POA: Diagnosis not present

## 2019-08-07 DIAGNOSIS — Z79890 Hormone replacement therapy (postmenopausal): Secondary | ICD-10-CM | POA: Diagnosis not present

## 2019-08-07 DIAGNOSIS — M1990 Unspecified osteoarthritis, unspecified site: Secondary | ICD-10-CM | POA: Insufficient documentation

## 2019-08-07 DIAGNOSIS — Z79899 Other long term (current) drug therapy: Secondary | ICD-10-CM | POA: Insufficient documentation

## 2019-08-07 DIAGNOSIS — J4550 Severe persistent asthma, uncomplicated: Secondary | ICD-10-CM | POA: Diagnosis not present

## 2019-08-07 HISTORY — PX: BRONCHIAL WASHINGS: SHX5105

## 2019-08-07 HISTORY — PX: BRONCHIAL BRUSHINGS: SHX5108

## 2019-08-07 HISTORY — PX: VIDEO BRONCHOSCOPY: SHX5072

## 2019-08-07 SURGERY — VIDEO BRONCHOSCOPY WITHOUT FLUORO
Anesthesia: General

## 2019-08-07 MED ORDER — ROCURONIUM BROMIDE 10 MG/ML (PF) SYRINGE
PREFILLED_SYRINGE | INTRAVENOUS | Status: DC | PRN
  Administered 2019-08-07: 60 mg via INTRAVENOUS

## 2019-08-07 MED ORDER — LACTATED RINGERS IV SOLN
INTRAVENOUS | Status: DC
  Administered 2019-08-07: 08:00:00 1000 mL via INTRAVENOUS

## 2019-08-07 MED ORDER — PHENYLEPHRINE HCL 0.25 % NA SOLN: 1.0000 | Freq: Four times a day (QID) | NASAL | Status: DC | PRN

## 2019-08-07 MED ORDER — SUGAMMADEX SODIUM 200 MG/2ML IV SOLN
INTRAVENOUS | Status: DC | PRN
  Administered 2019-08-07: 200 mg via INTRAVENOUS

## 2019-08-07 MED ORDER — PROPOFOL 1000 MG/100ML IV EMUL
INTRAVENOUS | Status: AC
  Filled 2019-08-07: qty 100

## 2019-08-07 MED ORDER — PROPOFOL 500 MG/50ML IV EMUL
INTRAVENOUS | Status: AC
  Filled 2019-08-07: qty 50

## 2019-08-07 MED ORDER — LIDOCAINE 2% (20 MG/ML) 5 ML SYRINGE
INTRAMUSCULAR | Status: DC | PRN
  Administered 2019-08-07: 80 mg via INTRAVENOUS

## 2019-08-07 MED ORDER — DEXAMETHASONE SODIUM PHOSPHATE 10 MG/ML IJ SOLN
INTRAMUSCULAR | Status: DC | PRN
  Administered 2019-08-07: 10 mg via INTRAVENOUS

## 2019-08-07 MED ORDER — BUTAMBEN-TETRACAINE-BENZOCAINE 2-2-14 % EX AERO: 1.0000 | INHALATION_SPRAY | Freq: Once | CUTANEOUS | Status: DC

## 2019-08-07 MED ORDER — ONDANSETRON HCL 4 MG/2ML IJ SOLN
INTRAMUSCULAR | Status: DC | PRN
  Administered 2019-08-07: 4 mg via INTRAVENOUS

## 2019-08-07 MED ORDER — LIDOCAINE HCL URETHRAL/MUCOSAL 2 % EX GEL: 1.0000 "application " | Freq: Once | CUTANEOUS | Status: DC

## 2019-08-07 MED ORDER — PROPOFOL 10 MG/ML IV BOLUS
INTRAVENOUS | Status: DC | PRN
  Administered 2019-08-07: 100 mg via INTRAVENOUS
  Administered 2019-08-07: 100 ug/kg/min via INTRAVENOUS

## 2019-08-07 NOTE — Op Note (Signed)
Video Bronchoscopy Procedure Note  Date of Operation: 08/07/2019  Pre-op Diagnosis: abnormal CT scan  Post-op Diagnosis: Same  Surgeon: Julian Hy  Assistants: none  Anesthesia: heavy sedation  Meds Given: per Anesthesia  Operation: Flexible video fiberoptic bronchoscopy with BAL and brush biopsies.  Estimated Blood Loss: 0 cc  Complications: none noted  Indications and History: Janet Giles is 79 y.o. with history of bronchiectasis, abnormal CT scan.  Recommendation was to perform video fiberoptic bronchoscopy with BAL, possible brush biopsy. The risks, benefits, complications, treatment options and expected outcomes were discussed with the patient.  The possibilities of pneumothorax, pneumonia, reaction to medication, pulmonary aspiration, perforation of a viscus, bleeding, failure to diagnose a condition and creating a complication requiring transfusion or operation were discussed with the patient who freely signed the consent.    Description of Procedure: The patient was seen in the Preoperative Area, was examined and was deemed appropriate to proceed.  The patient was taken to endoscopy, identified as Janet Giles and the procedure verified as Flexible Video Fiberoptic Bronchoscopy.  A Time Out was held and the above information confirmed.   Conscious sedation was initiated as indicated above. The video fiberoptic bronchoscope was introduced via the ETT and a general inspection was performed which showed normal cords, normal trachea, normal main carina. The R sided airways were inspected and showed normal RUL, BI, RML and RLL. The L side was then inspected. The LLL, Lingular and LUL airways were normal.   BAL of RUL apical segment were performed. 180cc instilled with minimal return. Micro brush biopsy was performed for cultures. BAL was performed in the inferior lingula to be sent for cultures. The patient tolerated the procedure well. The bronchoscope was  removed. There were no obvious complications.   Samples: 1. BAL RUL- respiratory culture, AFB, fungal culture 2. BAL lingula -respiratory culture, AFB, fungal culture 3. Micro brush- AFB, fungal culture  Plans:  We will review the microbiology results with the patient when they become available.  Outpatient followup will be with Dr Carlis Abbott as scheduled  Julian Hy, DO 08/07/19 9:35 AM [facility]

## 2019-08-07 NOTE — Anesthesia Preprocedure Evaluation (Addendum)
Anesthesia Evaluation  Patient identified by MRN, date of birth, ID band Patient awake    Reviewed: Allergy & Precautions, NPO status , Patient's Chart, lab work & pertinent test results  History of Anesthesia Complications (+) PONV and history of anesthetic complications  Airway Mallampati: I  TM Distance: >3 FB Neck ROM: Full    Dental  (+) Teeth Intact, Dental Advisory Given   Pulmonary asthma , former smoker,    breath sounds clear to auscultation       Cardiovascular negative cardio ROS   Rhythm:Regular Rate:Normal     Neuro/Psych  Neuromuscular disease    GI/Hepatic Neg liver ROS, GERD  ,  Endo/Other  Hypothyroidism   Renal/GU negative Renal ROS     Musculoskeletal  (+) Arthritis ,   Abdominal Normal abdominal exam  (+)   Peds  Hematology   Anesthesia Other Findings   Reproductive/Obstetrics                            Anesthesia Physical Anesthesia Plan  ASA: II  Anesthesia Plan: General   Post-op Pain Management:    Induction: Intravenous  PONV Risk Score and Plan: 2 and Ondansetron, Treatment may vary due to age or medical condition and TIVA  Airway Management Planned: Oral ETT  Additional Equipment: None  Intra-op Plan:   Post-operative Plan: Extubation in OR  Informed Consent: I have reviewed the patients History and Physical, chart, labs and discussed the procedure including the risks, benefits and alternatives for the proposed anesthesia with the patient or authorized representative who has indicated his/her understanding and acceptance.     Dental advisory given  Plan Discussed with: CRNA  Anesthesia Plan Comments:        Anesthesia Quick Evaluation

## 2019-08-07 NOTE — Discharge Summary (Signed)
Physician Discharge Summary   Patient ID: Janet Giles [id number] y.o. 08-31-1940  Admit date: 08/07/2019  Discharge date and time: No discharge date for patient encounter.   Admitting Physician: Julian Hy, DO   Discharge Physician: Julian Hy   Admission Diagnoses: BRONCHIECTASIS  Discharge Diagnoses:  Bronchiectasis, abnormal CT scan  Admission Condition: stable  Discharged Condition: stable  Indication for Admission: bronchoscopy  Hospital Course: Admitted for bronchoscopy, which was performed uneventfully. Samples from RUL & lingula sent for cultures.  Consults: None  Significant Diagnostic Studies: microbiology: routine respiratory, AFB, fungal cultures  Treatments: IV hydration and analgesia: per anesthesia record  Disposition:  home with husband  Patient Instructions:  Allergies as of 08/07/2019      Reactions   Wasp Venom Shortness Of Breath, Swelling   Doxycycline Nausea And Vomiting      Medication List    TAKE these medications   albuterol 108 (90 Base) MCG/ACT inhaler Commonly known as: VENTOLIN HFA Inhale 1 puff into the lungs every 6 (six) hours as needed for wheezing or shortness of breath.   Biotin 5000 MCG Caps Take 5,000 mcg by mouth every evening.   CALCIUM + D PO Take 1 tablet by mouth 2 (two) times daily.   CALCIUM + D3 PO Take 1 tablet by mouth daily.   ferrous gluconate 324 MG tablet Commonly known as: FERGON Take 1 tablet (324 mg total) by mouth 2 (two) times daily with a meal.   Flovent HFA 110 MCG/ACT inhaler Generic drug: fluticasone Inhale 1 puff into the lungs in the morning and at bedtime.   gabapentin 600 MG tablet Commonly known as: NEURONTIN Take 600 mg by mouth 2 (two) times daily.   HYDROcodone-acetaminophen 5-325 MG tablet Commonly known as: NORCO/VICODIN Take 1 tablet by mouth every 6 (six) hours as needed for moderate pain. Must last 28 days   levothyroxine 75 MCG tablet Commonly known  as: SYNTHROID Take 75 mcg by mouth at bedtime.   meloxicam 7.5 MG tablet Commonly known as: Mobic Take 1 tablet (7.5 mg total) by mouth daily. What changed:   when to take this  reasons to take this   Theratears 0.25 % Soln Generic drug: Carboxymethylcellulose Sodium Place 1 drop into both eyes 3 (three) times daily as needed (dry/irritated eyes.).   vitamin B-12 500 MCG tablet Commonly known as: CYANOCOBALAMIN Take 500 mcg by mouth every evening.      Activity: activity as tolerated and no driving today Diet: regular diet Wound Care: none needed  Follow-up with Dr. Carlis Abbott as planned on 4/20.  Signed: Julian Hy 08/07/2019 9:46 AM

## 2019-08-07 NOTE — Anesthesia Postprocedure Evaluation (Signed)
Anesthesia Post Note  Patient: Janet Giles  Procedure(s) Performed: VIDEO BRONCHOSCOPY WITHOUT FLUORO (N/A ) BRONCHIAL BRUSHINGS BRONCHIAL WASHINGS     Patient location during evaluation: PACU Anesthesia Type: General Level of consciousness: awake and alert Pain management: pain level controlled Vital Signs Assessment: post-procedure vital signs reviewed and stable Respiratory status: spontaneous breathing, nonlabored ventilation, respiratory function stable and patient connected to nasal cannula oxygen Cardiovascular status: blood pressure returned to baseline and stable Postop Assessment: no apparent nausea or vomiting Anesthetic complications: no    Last Vitals:  Vitals:   08/07/19 1001 08/07/19 1010  BP: 136/66 (!) 149/61  Pulse: 70 63  Resp: 18 17  Temp:    SpO2: 91% 97%    Last Pain:  Vitals:   08/07/19 1010  TempSrc:   PainSc: 2                  [name] Berkshire

## 2019-08-07 NOTE — Transfer of Care (Signed)
Immediate Anesthesia Transfer of Care Note  Patient: Janet Giles  Procedure(s) Performed: Procedure(s): VIDEO BRONCHOSCOPY WITHOUT FLUORO (N/A)  Patient Location: PACU  Anesthesia Type:General  Level of Consciousness: Alert, Awake, Oriented  Airway & Oxygen Therapy: Patient Spontanous Breathing  Post-op Assessment: Report given to RN  Post vital signs: Reviewed and stable  Last Vitals:  Vitals:   08/07/19 0745 08/07/19 0941  BP: (!) 133/57 (!) 147/56  Pulse: 67 73  Resp: 16 15  Temp: 36.6 C (!) 36 C  SpO2: [id number]    Complications: No apparent anesthesia complications

## 2019-08-07 NOTE — Anesthesia Procedure Notes (Signed)
Procedure Name: Intubation Date/Time: 08/07/2019 9:00 AM Performed by: Gerald Leitz, CRNA Pre-anesthesia Checklist: Patient identified, Patient being monitored, Timeout performed, Emergency Drugs available and Suction available Patient Re-evaluated:Patient Re-evaluated prior to induction Oxygen Delivery Method: Circle system utilized Preoxygenation: Pre-oxygenation with 100% oxygen Induction Type: IV induction Ventilation: Mask ventilation without difficulty Laryngoscope Size: Mac and 3 Grade View: Grade I Tube type: Oral Tube size: 9.0 mm Number of attempts: 1 Placement Confirmation: ETT inserted through vocal cords under direct vision,  positive ETCO2 and breath sounds checked- equal and bilateral Secured at: 21 cm Tube secured with: Tape Dental Injury: Teeth and Oropharynx as per pre-operative assessment

## 2019-08-07 NOTE — Interval H&P Note (Signed)
History and Physical Interval Note:  08/07/2019 8:43 AM  Janet Giles  has presented today for surgery, with the diagnosis of BRONCHIECTASIS.  The various methods of treatment have been discussed with the patient and family. After consideration of risks, benefits and other options for treatment, the patient has consented to  Procedure(s): VIDEO BRONCHOSCOPY WITHOUT FLUORO (N/A) as a surgical intervention.  The patient's history has been reviewed, patient examined, no change in status, stable for surgery.  I have reviewed the patient's chart and labs.  Questions were answered to the patient's satisfaction.    No AC or AP medications. Husband present to drive home. NPO since last night. Took inhalers. No change in medical history since her last visit.  Julian Hy

## 2019-08-10 LAB — ACID FAST SMEAR (AFB, MYCOBACTERIA)
Acid Fast Smear: NEGATIVE
Acid Fast Smear: NEGATIVE

## 2019-08-10 LAB — CULTURE, RESPIRATORY W GRAM STAIN
Culture: NO GROWTH
Culture: NO GROWTH
Gram Stain: NONE SEEN

## 2019-08-16 LAB — ACID FAST SMEAR (AFB, MYCOBACTERIA)

## 2019-08-16 LAB — FUNGAL ORGANISM REFLEX

## 2019-08-16 LAB — ACID FAST CULTURE WITH REFLEXED SENSITIVITIES (MYCOBACTERIA)

## 2019-08-16 LAB — FUNGUS CULTURE WITH STAIN

## 2019-08-16 LAB — FUNGUS CULTURE RESULT

## 2019-08-18 ENCOUNTER — Other Ambulatory Visit: Payer: Self-pay | Admitting: Family Medicine

## 2019-08-18 DIAGNOSIS — Z1231 Encounter for screening mammogram for malignant neoplasm of breast: Secondary | ICD-10-CM

## 2019-08-29 ENCOUNTER — Ambulatory Visit (INDEPENDENT_AMBULATORY_CARE_PROVIDER_SITE_OTHER): Payer: Medicare Other | Admitting: Critical Care Medicine

## 2019-08-29 ENCOUNTER — Other Ambulatory Visit: Payer: Self-pay

## 2019-08-29 ENCOUNTER — Encounter: Payer: Self-pay | Admitting: Critical Care Medicine

## 2019-08-29 VITALS — BP 118/60 | HR 79 | Temp 97.0°F | Ht 64.0 in | Wt 145.4 lb

## 2019-08-29 DIAGNOSIS — J479 Bronchiectasis, uncomplicated: Secondary | ICD-10-CM | POA: Diagnosis not present

## 2019-08-29 DIAGNOSIS — R918 Other nonspecific abnormal finding of lung field: Secondary | ICD-10-CM

## 2019-08-29 DIAGNOSIS — J449 Chronic obstructive pulmonary disease, unspecified: Secondary | ICD-10-CM | POA: Diagnosis not present

## 2019-08-29 MED ORDER — BEVESPI AEROSPHERE 9-4.8 MCG/ACT IN AERO: 2.0000 | INHALATION_SPRAY | Freq: Two times a day (BID) | RESPIRATORY_TRACT | 11 refills | Status: DC

## 2019-08-29 NOTE — Patient Instructions (Addendum)
Thank you for visiting Dr. Carlis Abbott at [facility]. We recommend the following:   Meds ordered this encounter  Medications  . Glycopyrrolate-Formoterol (BEVESPI AEROSPHERE) 9-4.8 MCG/ACT AERO    Sig: Inhale 2 puffs into the lungs 2 (two) times daily.    Dispense:  10.7 g    Refill:  11    Keep using albuterol every 4 hours as needed.   Return in about 3 months (around 11/28/2019).    Please do your part to reduce the spread of COVID-19.

## 2019-08-29 NOTE — Progress Notes (Signed)
Synopsis: Referred in March 2021 for shortness of breath by Janet Giles, L.Janet Sa, MD.  Subjective:   PATIENT ID: Janet Giles GENDER: female DOB: 05/23/1940, MRN: [id number]  Chief Complaint  Patient presents with  . Follow-up    Patient feels about the same as last visit. Patient still has shortness of breath that is worse with exertion. Denies cough.     Janet Giles is a 79 y/o woman who presents for follow-up from chronic dyspnea on exertion and abnormal CT.  She underwent bronchoscopy on 3/29 and all cultures so far negative.  At her last visit she was started on iron supplements which have helped her fatigue, but not her shortness of breath.  She has not been able to switch from Flovent to Symbicort due to inability to afford it on her insurance.  She has ongoing dyspnea on exertion and wheezing but no cough or sputum production.  She has no previous history of asthma prior to the last few years.  No history of allergies.  She has a significant tobacco history but is no longer smoking.   OV 07/21/19: Janet Giles is a 79 year old woman who presents for evaluation of shortness of breath since 2015.  She has a history of chronic asthma, which has been worse recently.  She saw her cardiologist Dr. Johnsie Cancel on 06/28/2019-note reviewed.  He felt that her cardiac studies did not explain the degree of dyspnea she is having.  She has been taking Flovent twice daily for several years for asthma, but is unsure if it has ever improved her symptoms.  She uses albuterol several times per day with improvement in her symptoms, but seldom previously required it.  She has chest tightness, coughing, and wheezing, but no sputum production.  Her symptoms are worse first thing in the morning, but improve throughout the day.  She has no significant nighttime symptoms that wake her up.  She has no history of allergies.  No nasal congestion, postnasal drip, sneezing, soft palate or throat itching.  No fever,  chills, sweats, weight loss, change in appetite.  She has significant fatigue all the time.  No history of bleeding or bruising, not taking any anticoagulation or antiplatelet medications.  She has a history of anemia.  She has a history of lupus, but is not currently on antirheumatic medications.  She follows with Dr. Estanislado Pandy from rheumatology.  Has a history of tobacco abuse, 25 years x 1.5 packs/day before quitting in 2002.    Past Medical History:  Diagnosis Date  . AAA (abdominal aortic aneurysm) ([place])   . ANA positive 08/06/2016  . 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 Asthma   . Complication of anesthesia    trouble breathing after back surgery  . 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 Carloyn Manner  . Degenerative joint disease of spine    buttocks pain -remains a problem  . Family history of adverse reaction to anesthesia    one sister nasea and vomiting  . Former smoker 08/13/2016  . GERD (gastroesophageal reflux disease)    occ  . High risk medication use 08/06/2016  . 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 Hyperlipidemia   . Hypothyroidism   . Idiopathic peripheral neuropathy 08/13/2016  . Lupus ([place])    Dr. Aura Fey  . Neuromuscular disorder (HCC)    neuropathy in feet  . PONV (postoperative nausea and vomiting)   . Primary osteoarthritis of both feet 08/06/2016  . Primary osteoarthritis of  both hands 08/06/2016  . Primary osteoarthritis of right hip 03/24/2015  . Raynaud's disease without gangrene 08/06/2016  . Shortness of breath dyspnea    uses inhaler occ     Family History  Problem Relation Age of Onset  . Diabetes Mother   . Heart disease Mother   . Heart attack Mother   . Bleeding Disorder Father   . Peptic Ulcer Father   . Heart disease Sister   . Heart attack Sister   . Cancer Sister   . Deep vein thrombosis Sister   . Diabetes Sister   . Hyperlipidemia Sister   . Hypertension Sister   . Varicose Veins Sister   .  AAA (abdominal aortic aneurysm) Sister   . Bleeding Disorder Sister   . Breast cancer Sister   . Heart disease Brother   . Heart attack Brother   . Hypertension Brother      Past Surgical History:  Procedure Laterality Date  . ABDOMINAL HYSTERECTOMY    . BACK SURGERY  02   L4-L5- fusion  . BRONCHIAL BRUSHINGS  08/07/2019   Procedure: BRONCHIAL BRUSHINGS;  Surgeon: Julian Hy, DO;  Location: [facility];  Service: Endoscopy;;  . BRONCHIAL WASHINGS  08/07/2019   Procedure: BRONCHIAL WASHINGS;  Surgeon: Julian Hy, DO;  Location: [facility];  Service: Endoscopy;;  . COLONOSCOPY WITH PROPOFOL N/A 03/17/2016   Procedure: COLONOSCOPY WITH PROPOFOL;  Surgeon: Garlan Fair, MD;  Location: [facility];  Service: Endoscopy;  Laterality: N/A;  . ESOPHAGOGASTRODUODENOSCOPY (EGD) WITH PROPOFOL N/A 03/17/2016   Procedure: ESOPHAGOGASTRODUODENOSCOPY (EGD) WITH PROPOFOL;  Surgeon: Garlan Fair, MD;  Location: [facility];  Service: Endoscopy;  Laterality: N/A;  . JOINT REPLACEMENT    . TONSILLECTOMY AND ADENOIDECTOMY    . TOTAL HIP ARTHROPLASTY Right 03/25/2015   Procedure: TOTAL HIP ARTHROPLASTY ANTERIOR APPROACH;  Surgeon: Frederik Pear, MD;  Location: [place];  Service: Orthopedics;  Laterality: Right;  Marland Kitchen VIDEO BRONCHOSCOPY N/A 08/07/2019   Procedure: VIDEO BRONCHOSCOPY WITHOUT FLUORO;  Surgeon: Julian Hy, DO;  Location: [facility];  Service: Endoscopy;  Laterality: N/A;    Social History   Socioeconomic History  . Marital status: Married    Spouse name: Not on file  . Number of children: 3  . Years of education: Not on file  . Highest education level: Some college, no degree  Occupational History  . Occupation: Retired  Tobacco Use  . Smoking status: Former Smoker    Packs/day: 1.50    Years: 35.00    Pack years: 52.50    Types: Cigarettes    Quit date: 05/11/2000    Years since quitting: 19.3  . Smokeless tobacco: Never Used  Substance and Sexual Activity  .  Alcohol use: Never    Alcohol/week: 0.0 standard drinks  . Drug use: Never  . Sexual activity: Not on file  Other Topics Concern  . Not on file  Social History Narrative   Right handed   2 cups of caffeine daily   Lives at home with husband and granddaughter    Social Determinants of Health   Financial Resource Strain:   . Difficulty of Paying Living Expenses:   Food Insecurity:   . Worried About Charity fundraiser in the Last Year:   . Arboriculturist in the Last Year:   Transportation Needs:   . Film/video editor (Medical):   Marland Kitchen Lack of Transportation (Non-Medical):   Physical Activity:   . Days of  Exercise per Week:   . Minutes of Exercise per Session:   Stress:   . Feeling of Stress :   Social Connections:   . Frequency of Communication with Friends and Family:   . Frequency of Social Gatherings with Friends and Family:   . Attends Religious Services:   . Active Member of Clubs or Organizations:   . Attends Archivist Meetings:   Marland Kitchen Marital Status:   Intimate Partner Violence:   . Fear of Current or Ex-Partner:   . Emotionally Abused:   Marland Kitchen Physically Abused:   . Sexually Abused:      Allergies  Allergen Reactions  . Wasp Venom Shortness Of Breath and Swelling  . Doxycycline Nausea And Vomiting     Immunization History  Administered Date(s) Administered  . Fluad Quad(high Dose 65+) 02/20/2019    Outpatient Medications Prior to Visit  Medication Sig Dispense Refill  . albuterol (PROVENTIL HFA;VENTOLIN HFA) 108 (90 BASE) MCG/ACT inhaler Inhale 1 puff into the lungs every 6 (six) hours as needed for wheezing or shortness of breath.    . Biotin 5000 MCG CAPS Take 5,000 mcg by mouth every evening.    . Calcium Carb-Cholecalciferol (CALCIUM + D3 PO) Take 1 tablet by mouth daily.    . Calcium Carbonate-Vitamin D (CALCIUM + D PO) Take 1 tablet by mouth 2 (two) times daily.    . Carboxymethylcellulose Sodium (THERATEARS) 0.25 % SOLN Place 1 drop into  both eyes 3 (three) times daily as needed (dry/irritated eyes.).    Marland Kitchen ferrous gluconate (FERGON) 324 MG tablet Take 1 tablet (324 mg total) by mouth 2 (two) times daily with a meal. 60 tablet 3  . FLOVENT HFA 110 MCG/ACT inhaler Inhale 1 puff into the lungs in the morning and at bedtime.     . gabapentin (NEURONTIN) 600 MG tablet Take 600 mg by mouth 2 (two) times daily.    Marland Kitchen HYDROcodone-acetaminophen (NORCO/VICODIN) 5-325 MG tablet Take 1 tablet by mouth every 6 (six) hours as needed for moderate pain. Must last 28 days 60 tablet 0  . levothyroxine (SYNTHROID, LEVOTHROID) 75 MCG tablet Take 75 mcg by mouth at bedtime.     . meloxicam (MOBIC) 7.5 MG tablet Take 1 tablet (7.5 mg total) by mouth daily. (Patient taking differently: Take 7.5 mg by mouth daily as needed (pain.). ) 30 tablet 3  . vitamin B-12 (CYANOCOBALAMIN) 500 MCG tablet Take 500 mcg by mouth every evening.     No facility-administered medications prior to visit.    Review of Systems  Constitutional: Positive for malaise/fatigue. Negative for chills, fever and weight loss.  HENT: Negative for congestion.        Hoarseness  Respiratory: Positive for cough and shortness of breath. Negative for sputum production.   Cardiovascular: Negative for chest pain and leg swelling.  Endo/Heme/Allergies: Negative for environmental allergies. Does not bruise/bleed easily.     Objective:   Vitals:   08/29/19 1028  BP: 118/60  Pulse: 79  Temp: (!) 97 F (36.1 C)  TempSrc: Temporal  SpO2: 92%  Weight: 145 lb 6.4 oz (66 kg)  Height: _0  (1.626 m)   92% on   RA BMI Readings from Last 3 Encounters:  08/29/19 24.96 kg/m  08/07/19 24.71 kg/m  07/21/19 24.82 kg/m   Wt Readings from Last 3 Encounters:  08/29/19 145 lb 6.4 oz (66 kg)  08/07/19 143 lb 15.4 oz (65.3 kg)  07/21/19 144 lb 9.6 oz (65.6 kg)  Physical Exam Vitals reviewed.  Constitutional:      Appearance: Normal appearance. She is not ill-appearing.  HENT:      Head: Normocephalic and atraumatic.  Eyes:     General: No scleral icterus. Cardiovascular:     Rate and Rhythm: Normal rate and regular rhythm.     Heart sounds: No murmur.  Pulmonary:     Comments: CTAB, breathing comfortably on RA Abdominal:     General: There is no distension.     Palpations: Abdomen is soft.  Musculoskeletal:        General: No swelling or deformity.     Cervical back: Neck supple.  Lymphadenopathy:     Cervical: No cervical adenopathy.  Skin:    General: Skin is warm and dry.     Findings: No rash.  Neurological:     General: No focal deficit present.     Mental Status: She is alert.     Coordination: Coordination normal.  Psychiatric:        Mood and Affect: Mood normal.        Behavior: Behavior normal.      CBC    Component Value Date/Time   WBC 5.1 07/21/2019 1127   RBC 3.30 (L) 07/21/2019 1127   HGB 10.4 Repeated and verified X2. (L) 07/21/2019 1127   HCT 30.5 (L) 07/21/2019 1127   PLT 345.0 07/21/2019 1127   MCV 92.4 07/21/2019 1127   MCH 30.8 02/14/2018 1134   MCHC 33.9 07/21/2019 1127   RDW 13.3 07/21/2019 1127   LYMPHSABS 1.6 07/21/2019 1127   MONOABS 0.4 07/21/2019 1127   EOSABS 0.2 07/21/2019 1127   BASOSABS 0.0 07/21/2019 1127    CHEMISTRY No results for input(s): NA, K, CL, CO2, GLUCOSE, BUN, CREATININE, CALCIUM, MG, PHOS in the last 168 hours. CrCl cannot be calculated (Patient's most recent lab result is older than the maximum 21 days allowed.).   06/27/2018 labs: IgG 1030 IgM 71 IgA 253  IgE 82 on 07/21/2019  02/14/2018 ANA positive 1: 40, nuclear homogenous pattern dsDNA antibody normal C3  & C4 normal  Micro: 08/07/2019 BAL fungus-penicillium species 08/07/2019 BAL AFB-NGTD 08/07/2019 BAL respiratory-negative 08/07/2019 BAL fungus lingula-no growth 08/07/2019 BAL AFB lingula-NGTD 08/07/2019 BAL respiratory   Chest Imaging- films reviewed: CT chest 07/05/2019-mild bronchiectasis in all lobes, right upper lobe  peripheral tree-in-bud opacities, associated micronodules.  No significant nodularity in RML, minimal distal inferior lingular nodules. Linear scarring bilateral lower lobes.  Borderline mediastinal adenopathy.  Pulmonary Functions Testing Results: PFT Results Latest Ref Rng & Units 01/09/2014  FVC-Pre L 2.01  FVC-Predicted Pre % 69  FVC-Post L 2.33  FVC-Predicted Post % 81  Pre FEV1/FVC % % 63  Post FEV1/FCV % % 60  FEV1-Pre L 1.26  FEV1-Predicted Pre % 58  FEV1-Post L 1.39  DLCO UNC% % 48  DLCO COR %Predicted % 69  TLC L 4.45  TLC % Predicted % 88  RV % Predicted % 105   2015- moderate obstruction with bronchodilator reversibility.  No air trapping or hyperinflation.  Moderate diffusion impairment.   Echocardiogram 10/06/2018: LVEF 55 to 35%, grade 1 diastolic dysfunction with elevated LVEDP.  Normal LA, RV, RA.  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  Mild MR.      Assessment & Plan:     ICD-10-CM   1. Bronchiectasis without complication ([place])  T73.2   2. Abnormal CT scan, lung  R91.8   3. Chronic obstructive pulmonary disease,  unspecified COPD type ([place])  J44.9      Dyspnea on exertion due to COPD. Her history of lack of improvement on ICS suggests against asthma. -Recommend stopping ICS and changing therapy to LABA-LABA.  We will try Bevespi as she has previously filled out [employer] paperwork for financial assistance.   -Continue albuterol as needed -PFTs pending -Recommend Covid vaccine.  Up-to-date on seasonal flu vaccine  Abnormal CT concerning for chronic infection, mild multi- lobar bronchiectasis.  Abnormalities likely due to mucous plugging rather than an infectious etiology based on cultures. -Continue to follow BAL cultures until finalized  Normocytic anemia -Continue iron supplements twice daily. -Defer to PCP for additional work-up  RTC in 3 months.   Current Outpatient Medications:  .  albuterol  (PROVENTIL HFA;VENTOLIN HFA) 108 (90 BASE) MCG/ACT inhaler, Inhale 1 puff into the lungs every 6 (six) hours as needed for wheezing or shortness of breath., Disp: , Rfl:  .  Biotin 5000 MCG CAPS, Take 5,000 mcg by mouth every evening., Disp: , Rfl:  .  Calcium Carb-Cholecalciferol (CALCIUM + D3 PO), Take 1 tablet by mouth daily., Disp: , Rfl:  .  Calcium Carbonate-Vitamin D (CALCIUM + D PO), Take 1 tablet by mouth 2 (two) times daily., Disp: , Rfl:  .  Carboxymethylcellulose Sodium (THERATEARS) 0.25 % SOLN, Place 1 drop into both eyes 3 (three) times daily as needed (dry/irritated eyes.)., Disp: , Rfl:  .  ferrous gluconate (FERGON) 324 MG tablet, Take 1 tablet (324 mg total) by mouth 2 (two) times daily with a meal., Disp: 60 tablet, Rfl: 3 .  FLOVENT HFA 110 MCG/ACT inhaler, Inhale 1 puff into the lungs in the morning and at bedtime. , Disp: , Rfl:  .  gabapentin (NEURONTIN) 600 MG tablet, Take 600 mg by mouth 2 (two) times daily., Disp: , Rfl:  .  HYDROcodone-acetaminophen (NORCO/VICODIN) 5-325 MG tablet, Take 1 tablet by mouth every 6 (six) hours as needed for moderate pain. Must last 28 days, Disp: 60 tablet, Rfl: 0 .  levothyroxine (SYNTHROID, LEVOTHROID) 75 MCG tablet, Take 75 mcg by mouth at bedtime. , Disp: , Rfl:  .  meloxicam (MOBIC) 7.5 MG tablet, Take 1 tablet (7.5 mg total) by mouth daily. (Patient taking differently: Take 7.5 mg by mouth daily as needed (pain.). ), Disp: 30 tablet, Rfl: 3 .  vitamin B-12 (CYANOCOBALAMIN) 500 MCG tablet, Take 500 mcg by mouth every evening., Disp: , Rfl:  .  Glycopyrrolate-Formoterol (BEVESPI AEROSPHERE) 9-4.8 MCG/ACT AERO, Inhale 2 puffs into the lungs 2 (two) times daily., Disp: 10.7 g, Rfl: [place] Meryl Hubers, DO [facility] 08/29/2019 12:05 PM

## 2019-08-29 NOTE — Telephone Encounter (Signed)
Patient had OV today and dropped off paperwork. Will hold onto it for now due to Dr. Carlis Abbott changing her inhaler. Nothing further needed at this time.

## 2019-08-30 ENCOUNTER — Ambulatory Visit
Admission: RE | Admit: 2019-08-30 | Discharge: 2019-08-30 | Disposition: A | Discharge status: Home / Self Care | Payer: Medicare Other | Source: Ambulatory Visit | Attending: Family Medicine | Admitting: Family Medicine

## 2019-08-30 DIAGNOSIS — Z1231 Encounter for screening mammogram for malignant neoplasm of breast: Secondary | ICD-10-CM

## 2019-08-31 ENCOUNTER — Telehealth: Payer: Self-pay | Admitting: Neurology

## 2019-08-31 MED ORDER — HYDROCODONE-ACETAMINOPHEN 5-325 MG PO TABS: 1.0000 | ORAL_TABLET | Freq: Four times a day (QID) | ORAL | 0 refills | Status: DC | PRN

## 2019-08-31 NOTE — Addendum Note (Signed)
Addended by: Marval Regal on: 08/31/2019 03:26 PM   Modules accepted: Orders

## 2019-08-31 NOTE — Addendum Note (Signed)
Addended by: Sarina Ill B on: 08/31/2019 03:49 PM   Modules accepted: Orders

## 2019-08-31 NOTE — Telephone Encounter (Signed)
Pt has called for a refill on her HYDROcodone-acetaminophen (NORCO/VICODIN) 5-325 MG tablet [place] [phone]

## 2019-08-31 NOTE — Telephone Encounter (Signed)
Drug registry check last refill August 02, 2019.

## 2019-09-01 ENCOUNTER — Other Ambulatory Visit (HOSPITAL_COMMUNITY)
Admission: RE | Admit: 2019-09-01 | Discharge: 2019-09-01 | Disposition: A | Discharge status: Home / Self Care | Payer: Medicare Other | Source: Ambulatory Visit | Attending: Critical Care Medicine | Admitting: Critical Care Medicine

## 2019-09-01 DIAGNOSIS — Z20822 Contact with and (suspected) exposure to covid-19: Secondary | ICD-10-CM | POA: Insufficient documentation

## 2019-09-01 DIAGNOSIS — Z01812 Encounter for preprocedural laboratory examination: Secondary | ICD-10-CM | POA: Diagnosis not present

## 2019-09-01 LAB — SARS CORONAVIRUS 2 (TAT 6-24 HRS): SARS Coronavirus 2: NEGATIVE

## 2019-09-04 ENCOUNTER — Ambulatory Visit (INDEPENDENT_AMBULATORY_CARE_PROVIDER_SITE_OTHER): Payer: Medicare Other | Admitting: Critical Care Medicine

## 2019-09-04 ENCOUNTER — Other Ambulatory Visit: Payer: Self-pay

## 2019-09-04 DIAGNOSIS — R918 Other nonspecific abnormal finding of lung field: Secondary | ICD-10-CM

## 2019-09-04 DIAGNOSIS — J4550 Severe persistent asthma, uncomplicated: Secondary | ICD-10-CM

## 2019-09-04 DIAGNOSIS — J479 Bronchiectasis, uncomplicated: Secondary | ICD-10-CM

## 2019-09-04 LAB — PULMONARY FUNCTION TEST
DL/VA % pred: 93 %
DL/VA: 3.81 ml/min/mmHg/L
DLCO cor % pred: 72 %
DLCO cor: 13.65 ml/min/mmHg
DLCO unc % pred: 64 %
DLCO unc: 12.2 ml/min/mmHg
FEF 25-75 Post: 0.71 L/sec
FEF 25-75 Pre: 0.49 L/sec
FEF2575-%Change-Post: 44 %
FEF2575-%Pred-Post: 48 %
FEF2575-%Pred-Pre: 33 %
FEV1-%Change-Post: 10 %
FEV1-%Pred-Post: 57 %
FEV1-%Pred-Pre: 52 %
FEV1-Post: 1.14 L
FEV1-Pre: 1.03 L
FEV1FVC-%Change-Post: -7 %
FEV1FVC-%Pred-Pre: 80 %
FEV6-%Change-Post: 17 %
FEV6-%Pred-Post: 80 %
FEV6-%Pred-Pre: 68 %
FEV6-Post: 2.02 L
FEV6-Pre: 1.71 L
FEV6FVC-%Change-Post: -1 %
FEV6FVC-%Pred-Post: 103 %
FEV6FVC-%Pred-Pre: 105 %
FVC-%Change-Post: 19 %
FVC-%Pred-Post: 77 %
FVC-%Pred-Pre: 64 %
FVC-Post: 2.06 L
FVC-Pre: 1.72 L
Post FEV1/FVC ratio: 55 %
Post FEV6/FVC ratio: 98 %
Pre FEV1/FVC ratio: 60 %
Pre FEV6/FVC Ratio: 100 %
RV % pred: 134 %
RV: 3.19 L
TLC % pred: 96 %
TLC: 4.9 L

## 2019-09-04 NOTE — Progress Notes (Signed)
PFT done today. 

## 2019-09-06 LAB — FUNGUS CULTURE WITH STAIN

## 2019-09-06 LAB — FUNGAL ORGANISM REFLEX

## 2019-09-06 LAB — FUNGUS CULTURE RESULT

## 2019-09-07 ENCOUNTER — Other Ambulatory Visit: Payer: Self-pay | Admitting: Neurology

## 2019-09-07 DIAGNOSIS — R0600 Dyspnea, unspecified: Secondary | ICD-10-CM

## 2019-09-07 DIAGNOSIS — R0609 Other forms of dyspnea: Secondary | ICD-10-CM

## 2019-09-12 ENCOUNTER — Telehealth: Payer: Self-pay | Admitting: Critical Care Medicine

## 2019-09-12 NOTE — Telephone Encounter (Signed)
Her PFTs confirm that she has COPD that is not controlled.  It is likely that it will take time to hear back from [employer] about the paperwork.  Mandi, can you confirm that this was sent in?  Do we know how much it would cost if we tried Yupelri or Perforomist nebulized?  Julian Hy, DO 09/12/19 1:28 PM [facility]

## 2019-09-12 NOTE — Telephone Encounter (Signed)
ATC Patient.  LM to call back. 

## 2019-09-12 NOTE — Telephone Encounter (Signed)
So that was my mistake because I meant to prescribe another [place] inhaler since I was switching it. We can get her to fill it out or I can sign my part. Thanks for looking into this.  LPC

## 2019-09-12 NOTE — Telephone Encounter (Signed)
Patient is returning phone call. Patient phone number is [phone].

## 2019-09-12 NOTE — Telephone Encounter (Signed)
Called and spoke with Patient.  Patient requested results from her PFT 09/04/19. Patient also wanted to make sure Dr Carlis Abbott knew that she was unable to start inhaler due to cost.  Patient stated cost was over $400 for Bevespi. Patient stated she gave Dr. Carlis Abbott her patient assistance form at last OV,  but has not heard anything back about it.  Message routed to Dr. Carlis Abbott for PFT results

## 2019-09-12 NOTE — Telephone Encounter (Signed)
Dr. Carlis Abbott we didn't do the paperwork for this patient. Looks like in the notes we were holding off on paperwork because you were changing her inhaler. I made a note in chart on 4/20 at 12:37.

## 2019-09-15 NOTE — Telephone Encounter (Signed)
[place] patient [place] paperwork signed for Bevespi inhaler.  Julian Hy, DO 09/15/19 3:20 PM [facility]

## 2019-09-18 NOTE — Telephone Encounter (Signed)
Paperwork has been faxed to [facility] and [place] and got confirmation. Will wait for response

## 2019-09-19 NOTE — Telephone Encounter (Signed)
Dr. Carlis Abbott please advise on patient's results from PFT on 09/04/2019

## 2019-09-19 NOTE — Telephone Encounter (Signed)
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  Looks like Janet Giles is not covered by insurance but Perforomist nebulized should be covered. [name] can you see how much it would be for the patient please?  Patient states that Bevespi was $400 and that she did not pick it up. We have faxed over paperwork to [place] and [place] just waiting on a response.

## 2019-09-19 NOTE — Telephone Encounter (Signed)
Here is my previous response, I think it may have gotten lost in there.  Her PFTs confirm that she has COPD that is not controlled.  It is likely that it will take time to hear back from [employer] about the paperwork.  Mandi, can you confirm that this was sent in?  Do we know how much it would cost if we tried Yupelri or Perforomist nebulized?   Julian Hy, DO 09/12/19 1:28 PM [facility]

## 2019-09-19 NOTE — Telephone Encounter (Signed)
Janet Giles is out of the office and I am not given access to system to determine.  Can look into pricing next week upon her return.  If patient has Medicare Part B only she will be responsible for 20% which will still likely be unaffordable.  If patient has Medicare Part B plus supplemental it should pick up majority of cost.   Mariella Saa, PharmD, [place], CPP Clinical Specialty Pharmacist [phone]  09/19/2019 3:23 PM

## 2019-09-23 LAB — ACID FAST CULTURE WITH REFLEXED SENSITIVITIES (MYCOBACTERIA): Acid Fast Culture: NEGATIVE

## 2019-09-24 NOTE — Progress Notes (Signed)
Please let Janet Giles know that another one of her cultures from her bronch was negative, which means that the spots on her lungs are most likely not infectious and are related to mucus plugging. Thanks!

## 2019-09-25 ENCOUNTER — Ambulatory Visit (HOSPITAL_COMMUNITY)
Admission: RE | Admit: 2019-09-25 | Discharge: 2019-09-25 | Disposition: A | Discharge status: Home / Self Care | Payer: Medicare Other | Source: Ambulatory Visit | Attending: Cardiovascular Disease | Admitting: Cardiovascular Disease

## 2019-09-25 ENCOUNTER — Other Ambulatory Visit: Payer: Self-pay

## 2019-09-25 ENCOUNTER — Encounter (HOSPITAL_COMMUNITY): Payer: Self-pay

## 2019-09-25 DIAGNOSIS — J479 Bronchiectasis, uncomplicated: Secondary | ICD-10-CM | POA: Diagnosis not present

## 2019-09-25 DIAGNOSIS — J9690 Respiratory failure, unspecified, unspecified whether with hypoxia or hypercapnia: Secondary | ICD-10-CM | POA: Diagnosis not present

## 2019-09-25 DIAGNOSIS — R0602 Shortness of breath: Secondary | ICD-10-CM | POA: Diagnosis not present

## 2019-09-25 LAB — ACID FAST CULTURE WITH REFLEXED SENSITIVITIES (MYCOBACTERIA): Acid Fast Culture: NEGATIVE

## 2019-09-25 NOTE — Telephone Encounter (Signed)
Spoke with patient today in regards to something else just following up on this message.

## 2019-09-26 NOTE — Telephone Encounter (Signed)
Unable to process test claim due to Medicare needing nebulizer information entered for billing. Medicare pays for 80% of the medication and patient's supplement should pick up the remaining 20%. Best way to get an exact price quote is to send rx into Pharmacy.

## 2019-09-26 NOTE — Telephone Encounter (Signed)
Dr. Carlis Abbott, Message from [place] in Pharmacy:  Unable to process test claim due to Medicare needing nebulizer information entered for billing. Medicare pays for 80% of the medication and patient's supplement should pick up the remaining 20%. Best way to get an exact price quote is to send rx into Pharmacy.  Looks like Performist should be covered. How would you like to proceed? Want to send in prescription?

## 2019-09-26 NOTE — Telephone Encounter (Signed)
Dr. Clark, please see pt's mychart message and advise. 

## 2019-09-29 NOTE — Progress Notes (Signed)
Office Visit Note  Patient: Janet Giles             Date of Birth: 18-Jan-1941           MRN: [id number]             PCP: Alroy Dust, L.Marlou Sa, MD Referring: Alroy Dust, Carlean Jews.Marlou Sa, MD Visit Date: 10/10/2019 Occupation: @GUAROCC @  Subjective:  Joint stiffness   History of Present Illness: Janet Giles is a 79 y.o. 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  She states that she has not had any rash in a long time.  She uses  of sunscreen.  She has not had any recent episodes of Raynaud's.  She continues to have some discomfort in her joints from underlying osteoarthritis.  She had right total hip replacement by Dr. Mayer Camel in the past and it has been more bothersome lately.  She has an appointment coming up with Dr. On.  She states she is also seeing a pulmonologist for shortness of breath and had detailed workup including CT scan of her chest.  There was a spot on this chest CT and the recommendation was to repeat CT scan in 6 months.  Activities of Daily Living:  Patient reports morning stiffness for  several  hours.   Patient Reports nocturnal pain.  Difficulty dressing/grooming: Denies Difficulty climbing stairs: Denies Difficulty getting out of chair: Denies Difficulty using hands for taps, buttons, cutlery, and/or writing: Reports  Review of Systems  Constitutional: Negative for fatigue, night sweats, weight gain and weight loss.  HENT: Negative for mouth sores, trouble swallowing, trouble swallowing, mouth dryness and nose dryness.   Eyes: Positive for dryness. Negative for pain, redness, itching and visual disturbance.  Respiratory: Positive for shortness of breath. Negative for cough, wheezing and difficulty breathing.   Cardiovascular: Negative for chest pain, palpitations, hypertension, irregular heartbeat and swelling in legs/feet.  Gastrointestinal: Negative for blood in stool, constipation and diarrhea.  Endocrine: Negative  for increased urination.  Genitourinary: Negative for difficulty urinating, painful urination and vaginal dryness.  Musculoskeletal: Positive for arthralgias, joint pain and morning stiffness. Negative for joint swelling, myalgias, muscle weakness, muscle tenderness and myalgias.  Skin: Negative for color change, rash, hair loss, skin tightness, ulcers and sensitivity to sunlight.  Allergic/Immunologic: Negative for susceptible to infections.  Neurological: Positive for numbness. Negative for dizziness, headaches, memory loss, night sweats and weakness.  Hematological: Positive for bruising/bleeding tendency. Negative for swollen glands.  Psychiatric/Behavioral: Negative for depressed mood, confusion and sleep disturbance. The patient is not nervous/anxious.     PMFS History:  Patient Active Problem List   Diagnosis Date Noted  . 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 Idiopathic peripheral neuropathy 08/13/2016  . 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 Former smoker 08/13/2016  . ANA positive 08/06/2016  . Raynaud's disease without gangrene 08/06/2016  . Primary osteoarthritis of both hands 08/06/2016  . Primary osteoarthritis of both feet 08/06/2016  . High risk medication use 08/06/2016  . Primary osteoarthritis of right hip 03/24/2015  . 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 AAA (abdominal aortic aneurysm) ([place])   . ANA positive 08/06/2016  . 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 Asthma   . Complication of anesthesia    trouble breathing after back surgery  . 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 Carloyn Manner  . Degenerative joint disease of spine    buttocks  pain -remains a problem  . Family history of adverse reaction to anesthesia    one sister nasea and vomiting  . Former smoker 08/13/2016  . GERD (gastroesophageal reflux disease)    occ  . High risk medication use 08/06/2016  . History of total hip  replacement, right 08/13/2016  . Hyperlipidemia   . Hypothyroidism   . Idiopathic peripheral neuropathy 08/13/2016  . Lupus ([place])    Dr. Aura Fey  . Neuromuscular disorder (H[facility])    neuropathy in feet  . PONV (postoperative nausea and vomiting)   . Primary osteoarthritis of both feet 08/06/2016  . Primary osteoarthritis of both hands 08/06/2016  . Primary osteoarthritis of right hip 03/24/2015  . Raynaud's disease without gangrene 08/06/2016  . Shortness of breath dyspnea    uses inhaler occ    Family History  Problem Relation Age of Onset  . Diabetes Mother   . Heart disease Mother   . Heart attack Mother   . Bleeding Disorder Father   . Peptic Ulcer Father   . Heart disease Sister   . Heart attack Sister   . Cancer Sister   . Deep vein thrombosis Sister   . Diabetes Sister   . Hyperlipidemia Sister   . Hypertension Sister   . Varicose Veins Sister   . AAA (abdominal aortic aneurysm) Sister   . Bleeding Disorder Sister   . Breast cancer Sister   . Heart disease Brother   . Heart attack Brother   . Hypertension Brother    Past Surgical History:  Procedure Laterality Date  . ABDOMINAL HYSTERECTOMY    . BACK SURGERY  2   L4-L5- fusion  . BRONCHIAL BRUSHINGS  08/07/2019   Procedure: BRONCHIAL BRUSHINGS;  Surgeon: Julian Hy, DO;  Location: [facility];  Service: Endoscopy;;  . BRONCHIAL WASHINGS  08/07/2019   Procedure: BRONCHIAL WASHINGS;  Surgeon: Julian Hy, DO;  Location: [facility];  Service: Endoscopy;;  . COLONOSCOPY WITH PROPOFOL N/A 03/17/2016   Procedure: COLONOSCOPY WITH PROPOFOL;  Surgeon: Garlan Fair, MD;  Location: [facility];  Service: Endoscopy;  Laterality: N/A;  . ESOPHAGOGASTRODUODENOSCOPY (EGD) WITH PROPOFOL N/A 03/17/2016   Procedure: ESOPHAGOGASTRODUODENOSCOPY (EGD) WITH PROPOFOL;  Surgeon: Garlan Fair, MD;  Location: [facility];  Service: Endoscopy;  Laterality: N/A;  . JOINT REPLACEMENT    . TONSILLECTOMY AND  ADENOIDECTOMY    . TOTAL HIP ARTHROPLASTY Right 03/25/2015   Procedure: TOTAL HIP ARTHROPLASTY ANTERIOR APPROACH;  Surgeon: Frederik Pear, MD;  Location: [facility];  Service: Orthopedics;  Laterality: Right;  Marland Kitchen VIDEO BRONCHOSCOPY N/A 08/07/2019   Procedure: VIDEO BRONCHOSCOPY WITHOUT FLUORO;  Surgeon: Julian Hy, DO;  Location: [facility];  Service: Endoscopy;  Laterality: N/A;   Social History   Social History Narrative   Right handed   2 cups of caffeine daily   Lives at home with husband and granddaughter    Immunization History  Administered Date(s) Administered  . Fluad Quad(high Dose 65+) 02/20/2019     Objective: Vital Signs: BP 134/68 (BP Location: Left Arm, Patient Position: Sitting, Cuff Size: Normal)   Pulse 74   Resp 14   Ht 5\' 4"  (1.626 m)   Wt 146 lb 9.6 oz (66.5 kg)   BMI 25.16 kg/m    Physical Exam Vitals and nursing note reviewed.  Constitutional:      Appearance: She is well-developed.  HENT:     Head: Normocephalic and atraumatic.  Eyes:     Conjunctiva/sclera: Conjunctivae normal.  Cardiovascular:     Rate and Rhythm: Normal rate and regular rhythm.     Heart sounds: Normal heart sounds.  Pulmonary:     Effort: Pulmonary effort is normal.     Breath sounds: Normal breath sounds.  Abdominal:     General: Bowel sounds are normal.     Palpations: Abdomen is soft.  Musculoskeletal:     Cervical back: Normal range of motion.  Lymphadenopathy:     Cervical: No cervical adenopathy.  Skin:    General: Skin is warm and dry.     Capillary Refill: Capillary refill takes less than 2 seconds.  Neurological:     Mental Status: She is alert and oriented to person, place, and time.  Psychiatric:        Behavior: Behavior normal.      Musculoskeletal Exam: C-spine was in good range of motion.  She had discomfort range of motion of her lumbar spine.  Shoulder joints, elbow joints, wrist joints with good range of motion.  She has bilateral CMC PIP and DIP  thickening with no synovitis.  She had discomfort range of motion of right hip joint which is replaced.  Knee joints are in good range of motion.  She has PIP and DIP thickening in her feet with no synovitis.  CDAI Exam: CDAI Score: -- Patient Global: --; Provider Global: -- Swollen: --; Tender: -- Joint Exam 10/10/2019   No joint exam has been documented for this visit   There is currently no information documented on the homunculus. Go to the Rheumatology activity and complete the homunculus joint exam.  Investigation: No additional findings.  Imaging: CT Chest Wo Contrast  Result Date: 09/25/2019 CLINICAL DATA:  Respiratory failure, dyspnea and bronchiectasis. EXAM: CT CHEST WITHOUT CONTRAST TECHNIQUE: Multidetector CT imaging of the chest was performed following the standard protocol without IV contrast. COMPARISON:  07/06/2019 FINDINGS: Cardiovascular: Calcified coronary artery disease. Calcified atherosclerotic changes. Heart size is stable. No pericardial effusion. Limited assessment of vascular structures in the chest due to lack of intravenous contrast. Mediastinum/Nodes: No thoracic inlet adenopathy. No axillary lymphadenopathy. No mediastinal lymphadenopathy. No hilar lymphadenopathy. Lungs/Pleura: Tree-in-bud opacity and mild bronchial dilation at the periphery in the RIGHT upper lobe with very similar appearance. Nodule in the medial RIGHT upper lobe measuring 7 mm (image 39, series 7) previously approximately 6 mm but changed slightly in morphology compared to the prior study and associated with branching changes. RIGHT upper lobe nodule posteriorly located in the RIGHT upper lobe has improved this area measuring 8 x 5 mm previously 11 x 6 mm other areas show less density, for instance dilated bronchials at the periphery which previously showed more nodular characteristics on image 45 of series 7. Areas of bronchiectasis elsewhere are similar to the previous exam. No consolidation. No  pleural effusion. Upper Abdomen: Incidental imaging of upper abdominal contents is unremarkable aside from atherosclerotic changes in the abdominal aorta. Musculoskeletal: No chest wall lesion. No acute bone process. No destructive bone finding. Spinal degenerative changes. IMPRESSION: 1. Signs of chronic infection with improved appearance of dominant area of nodularity in the RIGHT upper chest and slight increase in size of branching area of nodularity in the medial RIGHT upper lobe. No new areas of nodularity. Aortic Atherosclerosis (ICD10-I70.0). Electronically Signed   By: Zetta Bills M.D.   On: 09/25/2019 14:42    Recent Labs: Lab Results  Component Value Date   WBC 5.1 07/21/2019   HGB 10.4 Repeated and verified X2. (L) 07/21/2019  PLT 345.0 07/21/2019   NA 135 07/21/2019   K 5.0 07/21/2019   CL 103 07/21/2019   CO2 26 07/21/2019   GLUCOSE 87 07/21/2019   BUN 22 07/21/2019   CREATININE 0.77 07/21/2019   BILITOT 0.5 07/21/2019   ALKPHOS 48 07/21/2019   AST 18 07/21/2019   ALT 15 07/21/2019   PROT 7.1 07/21/2019   ALBUMIN 4.2 07/21/2019   CALCIUM 9.9 07/21/2019   GFRAA 81 02/14/2018    Speciality Comments: No specialty comments available.  Procedures:  No procedures performed Allergies: Wasp venom and Doxycycline   Assessment / Plan:     Visit Diagnoses: Autoimmune disease ([place]) - +ANA 1:40, ds DNA -, Raynauds , history of skin lupus biopsy proven by Dr. Ubaldo Glassing per patient. -Patient had no recent episode of rash or Raynaud's phenomenon.  Her autoimmune disease has been quiet for long time.  She is concerned that her leg changes could be consistent with autoimmune disease.  I tried to convince her it is not related.  She requests getting repeat labs.  We will obtain labs today.  Plan: CBC with Differential/Platelet, COMPLETE METABOLIC PANEL WITH GFR, Sedimentation rate, ANA, Anti-DNA antibody, double-stranded, C3 and C4, Sjogrens syndrome-A extractable nuclear antibody,  Sjogrens syndrome-B extractable nuclear antibody, RNP Antibody, Anti-Smith antibody  High risk medication use - been off of Plaquenil since April 2018.  Raynaud's disease without gangrene-not active.  Primary osteoarthritis of both hands-she has severe osteoarthritis in her hands which causes discomfort.  Joint protection muscle strengthening was discussed.  Primary osteoarthritis of both feet-she has osteoarthritis in her feet.  Proper fitting shoes were discussed.  History of total hip replacement, right -by Dr. Mayer Camel.  She states she has been having increased discomfort in her right hip.  She has appointment coming up with Dr. Mayer Camel.  DDD (degenerative disc disease), lumbar-chronic pain.  Trigger middle finger of right hand-she has intermittent discomfort and she is not ready for injection.  Idiopathic peripheral neuropathy-she continues to have a lot of discomfort in her feet due to neuropathy.  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 CBC with Differential/Platelet  . COMPLETE METABOLIC PANEL WITH GFR  . Sedimentation rate  . ANA  . Anti-DNA antibody, double-stranded  . C3 and C4  . Sjogrens syndrome-A extractable nuclear antibody  . Sjogrens syndrome-B extractable nuclear antibody  . RNP Antibody  . Anti-Smith antibody   No orders of the defined types were placed in this encounter.   Face-to-face time spent with patient was 30 minutes. Greater than 50% of time was spent in counseling and coordination of care.  Follow-Up Instructions: Return for Autoimmune disease.   Bo Merino, MD  Note - This record has been created using Editor, commissioning.  Chart creation errors have been sought, but may not always  have been located. Such creation errors do not reflect on  the standard of medical care.

## 2019-10-02 ENCOUNTER — Other Ambulatory Visit: Payer: Self-pay | Admitting: Neurology

## 2019-10-02 MED ORDER — HYDROCODONE-ACETAMINOPHEN 5-325 MG PO TABS: 1.0000 | ORAL_TABLET | Freq: Four times a day (QID) | ORAL | 0 refills | Status: DC | PRN

## 2019-10-02 NOTE — Telephone Encounter (Signed)
Drug registry check last refill August 31, 2019.

## 2019-10-02 NOTE — Progress Notes (Signed)
Please let Janet Giles know that the spot on the lung is still there but smaller. We will do a follow up CT scan in 6 months to recheck this. Please let her know we are sending in the nebs as well. Thanks!

## 2019-10-02 NOTE — Telephone Encounter (Signed)
Yes please, thanks!  LPC

## 2019-10-02 NOTE — Telephone Encounter (Signed)
1) Medication(s) Requested (by name): HYDROcodone-acetaminophen (NORCO/VICODIN) 5-325 MG tablet   2) Pharmacy of Choice:  [facility] ([facility] [place]  [id number], [place] ([place]) [place] [address]

## 2019-10-02 NOTE — Addendum Note (Signed)
Addended by: Marval Regal on: 10/02/2019 01:57 PM   Modules accepted: Orders

## 2019-10-03 MED ORDER — PERFOROMIST 20 MCG/2ML IN NEBU: 20.0000 ug | INHALATION_SOLUTION | Freq: Two times a day (BID) | RESPIRATORY_TRACT | 3 refills | Status: DC

## 2019-10-03 NOTE — Telephone Encounter (Signed)
It is always possible with nodules but less likely with multiple nodules and the largest one getting smaller. We are going to continue to follow it on follow up CT scans.   Julian Hy, DO 10/03/19 12:21 PM [facility]

## 2019-10-03 NOTE — Telephone Encounter (Signed)
Called and spoke with patient. Her [place] patient assistance paperwork was approved and she has received 3 Bevespi inhalers. According to other note from Dr. Carlis Abbott she wants to be on Bevepsi and not Perforomist neb medication. Order for nebulized medications has been canceled and we discussed how to use the Bevespi inhaler. Patient expressed understanding. Nothing further needed at this time.

## 2019-10-03 NOTE — Telephone Encounter (Signed)
Called and spoke with patient. Her [place] patient assistance paperwork was approved and she has received 3 Bevespi inhalers. According to other note from Dr. Carlis Abbott she wants to be on Bevepsi and not Perforomist neb medication. Order for nebulized medications has been canceled and we discussed how to use the Bevespi inhaler. Patient expressed understanding. Nothing further needed at this time.   Dr. Carlis Abbott she is asking about the spot on her recent CT. She wants to know of you feel like it could be cancerous in anyway? Please advise

## 2019-10-03 NOTE — Telephone Encounter (Signed)
Called and spoke with patient. Her [place] patient assistance paperwork was approved and she has received 3 Bevespi inhalers on Saturday. According to other note from Dr. Carlis Abbott she wants to be on Bevepsi and not Perforomist neb medication. Order for nebulized medications has been canceled and we discussed how to use the Bevespi inhaler. Patient expressed understanding. Nothing further needed at this time.

## 2019-10-03 NOTE — Telephone Encounter (Signed)
ATC patient per DPR left detailed message with Dr. Anell Barr response. Nothing further needed at this time.

## 2019-10-03 NOTE — Telephone Encounter (Signed)
Medication has been sent over to patient's pharmacy for Performist . Nothing else needed

## 2019-10-10 ENCOUNTER — Encounter: Payer: Self-pay | Admitting: Rheumatology

## 2019-10-10 ENCOUNTER — Other Ambulatory Visit: Payer: Self-pay

## 2019-10-10 ENCOUNTER — Ambulatory Visit (INDEPENDENT_AMBULATORY_CARE_PROVIDER_SITE_OTHER): Payer: Medicare Other | Admitting: Rheumatology

## 2019-10-10 VITALS — BP 134/68 | HR 74 | Resp 14 | Ht 64.0 in | Wt 146.6 lb

## 2019-10-10 DIAGNOSIS — M65331 Trigger finger, right middle finger: Secondary | ICD-10-CM

## 2019-10-10 DIAGNOSIS — I77811 Abdominal aortic ectasia: Secondary | ICD-10-CM

## 2019-10-10 DIAGNOSIS — R5383 Other fatigue: Secondary | ICD-10-CM

## 2019-10-10 DIAGNOSIS — M359 Systemic involvement of connective tissue, unspecified: Secondary | ICD-10-CM

## 2019-10-10 DIAGNOSIS — Z87891 Personal history of nicotine dependence: Secondary | ICD-10-CM | POA: Diagnosis not present

## 2019-10-10 DIAGNOSIS — Z79899 Other long term (current) drug therapy: Secondary | ICD-10-CM | POA: Diagnosis not present

## 2019-10-10 DIAGNOSIS — M5136 Other intervertebral disc degeneration, lumbar region: Secondary | ICD-10-CM

## 2019-10-10 DIAGNOSIS — I7300 Raynaud's syndrome without gangrene: Secondary | ICD-10-CM

## 2019-10-10 DIAGNOSIS — Z96641 Presence of right artificial hip joint: Secondary | ICD-10-CM

## 2019-10-10 DIAGNOSIS — G609 Hereditary and idiopathic neuropathy, unspecified: Secondary | ICD-10-CM | POA: Diagnosis not present

## 2019-10-10 DIAGNOSIS — M19072 Primary osteoarthritis, left ankle and foot: Secondary | ICD-10-CM

## 2019-10-10 DIAGNOSIS — M19042 Primary osteoarthritis, left hand: Secondary | ICD-10-CM

## 2019-10-10 DIAGNOSIS — M19071 Primary osteoarthritis, right ankle and foot: Secondary | ICD-10-CM | POA: Diagnosis not present

## 2019-10-10 DIAGNOSIS — M19041 Primary osteoarthritis, right hand: Secondary | ICD-10-CM

## 2019-10-11 NOTE — Progress Notes (Signed)
CBC shows anemia which is a stable.  CMP is normal.  All autoimmune work-up was negative.  Please notify patient.  Please forward labs to Dr. Rolm Bookbinder.

## 2019-10-12 DIAGNOSIS — Z96641 Presence of right artificial hip joint: Secondary | ICD-10-CM | POA: Diagnosis not present

## 2019-10-12 DIAGNOSIS — M7061 Trochanteric bursitis, right hip: Secondary | ICD-10-CM | POA: Diagnosis not present

## 2019-10-12 DIAGNOSIS — Z471 Aftercare following joint replacement surgery: Secondary | ICD-10-CM | POA: Diagnosis not present

## 2019-10-13 ENCOUNTER — Other Ambulatory Visit (HOSPITAL_COMMUNITY): Payer: Medicare Other

## 2019-10-13 LAB — CBC WITH DIFFERENTIAL/PLATELET
Absolute Monocytes: 501 cells/uL (ref 200–950)
Basophils Absolute: 22 cells/uL (ref 0–200)
Basophils Relative: 0.4 %
Eosinophils Absolute: 187 cells/uL (ref 15–500)
Eosinophils Relative: 3.4 %
HCT: 31.2 % — ABNORMAL LOW (ref 35.0–45.0)
Hemoglobin: 10.3 g/dL — ABNORMAL LOW (ref 11.7–15.5)
Lymphs Abs: 1837 cells/uL (ref 850–3900)
MCH: 31.3 pg (ref 27.0–33.0)
MCHC: 33 g/dL (ref 32.0–36.0)
MCV: 94.8 fL (ref 80.0–100.0)
MPV: 8.9 fL (ref 7.5–12.5)
Monocytes Relative: 9.1 %
Neutro Abs: 2954 cells/uL (ref 1500–7800)
Neutrophils Relative %: 53.7 %
Platelets: 327 10*3/uL (ref 140–400)
RBC: 3.29 10*6/uL — ABNORMAL LOW (ref 3.80–5.10)
RDW: 12.5 % (ref 11.0–15.0)
Total Lymphocyte: 33.4 %
WBC: 5.5 10*3/uL (ref 3.8–10.8)

## 2019-10-13 LAB — RNP ANTIBODY: Ribonucleic Protein(ENA) Antibody, IgG: <1 AI

## 2019-10-13 LAB — COMPLETE METABOLIC PANEL WITH GFR
AG Ratio: 1.7 (calc) (ref 1.0–2.5)
ALT: 15 U/L (ref 6–29)
AST: 15 U/L (ref 10–35)
Albumin: 4.2 g/dL (ref 3.6–5.1)
Alkaline phosphatase (APISO): 45 U/L (ref 37–153)
BUN: 19 mg/dL (ref 7–25)
CO2: 27 mmol/L (ref 20–32)
Calcium: 9.7 mg/dL (ref 8.6–10.4)
Chloride: 105 mmol/L (ref 98–110)
Creat: 0.83 mg/dL (ref 0.60–0.93)
GFR, Est African American: 78 mL/min/{1.73_m2} (ref >=60)
GFR, Est Non African American: 67 mL/min/{1.73_m2} (ref >=60)
Globulin: 2.5 g/dL (calc) (ref 1.9–3.7)
Glucose, Bld: 75 mg/dL (ref 65–99)
Potassium: 4.8 mmol/L (ref 3.5–5.3)
Sodium: 138 mmol/L (ref 135–146)
Total Bilirubin: 0.6 mg/dL (ref 0.2–1.2)
Total Protein: 6.7 g/dL (ref 6.1–8.1)

## 2019-10-13 LAB — ANTI-NUCLEAR AB-TITER (ANA TITER): ANA Titer 1: 1:80 {titer} — ABNORMAL HIGH

## 2019-10-13 LAB — SJOGRENS SYNDROME-A EXTRACTABLE NUCLEAR ANTIBODY: SSA (Ro) (ENA) Antibody, IgG: <1 AI

## 2019-10-13 LAB — C3 AND C4
C3 Complement: 132 mg/dL (ref 83–193)
C4 Complement: 15 mg/dL (ref 15–57)

## 2019-10-13 LAB — ANA: Anti Nuclear Antibody (ANA): POSITIVE — AB

## 2019-10-13 LAB — SJOGRENS SYNDROME-B EXTRACTABLE NUCLEAR ANTIBODY: SSB (La) (ENA) Antibody, IgG: <1 AI

## 2019-10-13 LAB — ANTI-SMITH ANTIBODY: ENA SM Ab Ser-aCnc: <1 AI

## 2019-10-13 LAB — SEDIMENTATION RATE: Sed Rate: 25 mm/h (ref 0–30)

## 2019-10-13 LAB — ANTI-DNA ANTIBODY, DOUBLE-STRANDED: ds DNA Ab: 3 IU/mL

## 2019-10-13 NOTE — Progress Notes (Signed)
I called patient to discuss lab results.  She is anemic.  She also has positive ANA which has been present for many years.  None of the other autoimmune antibodies were positive.  Complements were normal.  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  Plaquenil is not indicated at this time.  Advised patient to contact us in case she develops a rash.

## 2019-10-13 NOTE — Progress Notes (Signed)
ANA is low titer which is not significant.

## 2019-10-17 ENCOUNTER — Ambulatory Visit: Payer: Medicare Other | Admitting: Rheumatology

## 2019-10-18 ENCOUNTER — Ambulatory Visit (INDEPENDENT_AMBULATORY_CARE_PROVIDER_SITE_OTHER): Payer: Medicare Other | Admitting: Family Medicine

## 2019-10-18 VITALS — BP 146/68 | HR 67 | Ht 63.5 in | Wt 145.0 lb

## 2019-10-18 DIAGNOSIS — D649 Anemia, unspecified: Secondary | ICD-10-CM | POA: Diagnosis not present

## 2019-10-18 DIAGNOSIS — R252 Cramp and spasm: Secondary | ICD-10-CM

## 2019-10-18 DIAGNOSIS — G609 Hereditary and idiopathic neuropathy, unspecified: Secondary | ICD-10-CM

## 2019-10-18 NOTE — Progress Notes (Signed)
PATIENT: Janet Giles DOB: 03/16/1941  REASON FOR VISIT: follow up HISTORY FROM: patient  Chief Complaint  Patient presents with  . Follow-up    Rm 2 here for a f/u on neuropathy. Pt is having leg cramping.Pt said she is anemic.      HISTORY OF PRESENT ILLNESS: Today 10/19/19 Janet Giles is a 79 y.o. female here today for follow up for neuropathy. She feels that right foot seems "a little worse" since last being seen. She has constant tingling of both feet. She also notes cramping of her legs at night. Dr Jannifer Franklin added levetiracetam 250mg  BID and continued gabapentin 600mg  TID at last follow up in 04/2019. She does not remember taking levetiracetam. She does not remember any negative side effects due to medications over the past 6 months. She was also given rx for Mobic as this has helped with back pain. She reports ortho increased dose to 15mg . She will take it 2-3 times a week for hip pain. Hydrocodone 5-325mg  BID. She feels that this helps the most with left sided low back pain. Last refilled 10/02/2019 for 60 tablets. She was seen by Dr Estanislado Pandy, rheumatology, on 6/1. Labs showed stable chronic anemia and positive ANA but all other auto immune labs normal. Plaquenil was not indicated. B12 in 06/2018 1721. She reports that pulmonary started her on iron supplements 2-3 months ago. She has follow this month with PCP for evaluation. She admits that she does not drink a lot of water.   HISTORY: (copied from Dr Jannifer Franklin' note on 04/19/2019)  Janet Giles is a 79 year old right-handed white female with a history of bilateral dysesthesias of the feet that is felt secondary to a small fiber neuropathy.  The patient has a history of lupus, she is not on any disease modifying agents at this point.  She has been on gabapentin for her neuropathy, she does not have full pain control with this medication.  She takes hydrocodone if needed as well.  She recently had a laceration to the  left leg and was placed on Mobic for a short period time and she realized this helped her back pain significantly.  The patient has not had any falls, occasionally she may use a cane for ambulation.  She comes to the office today for an evaluation.  The patient has discomfort in the feet if something touches them, she cannot wear regular shoes, even the sheets touching the feet at night are uncomfortable.   REVIEW OF SYSTEMS: Out of a complete 14 system review of symptoms, the patient complains only of the following symptoms, chronic pain, muscle cramps, neuropathy and all other reviewed systems are negative.   ALLERGIES: Allergies  Allergen Reactions  . Wasp Venom Shortness Of Breath and Swelling  . Doxycycline Nausea And Vomiting    HOME MEDICATIONS: Outpatient Medications Prior to Visit  Medication Sig Dispense Refill  . albuterol (PROVENTIL HFA;VENTOLIN HFA) 108 (90 BASE) MCG/ACT inhaler Inhale 1 puff into the lungs every 6 (six) hours as needed for wheezing or shortness of breath.    . Biotin 5000 MCG CAPS Take 5,000 mcg by mouth every evening.    . Calcium Carb-Cholecalciferol (CALCIUM + D3 PO) Take 1 tablet by mouth daily.    . ferrous gluconate (FERGON) 324 MG tablet Take 1 tablet (324 mg total) by mouth 2 (two) times daily with a meal. 60 tablet 3  . gabapentin (NEURONTIN) 600 MG tablet TAKE 1 TABLET BY MOUTH THREE TIMES DAILY 270  tablet 1  . Glycopyrrolate-Formoterol (BEVESPI AEROSPHERE) 9-4.8 MCG/ACT AERO Inhale 2 puffs into the lungs 2 (two) times daily. 10.7 g 11  . HYDROcodone-acetaminophen (NORCO/VICODIN) 5-325 MG tablet Take 1 tablet by mouth every 6 (six) hours as needed for moderate pain. Must last 28 days 60 tablet 0  . levothyroxine (SYNTHROID, LEVOTHROID) 75 MCG tablet Take 75 mcg by mouth at bedtime.     . vitamin B-12 (CYANOCOBALAMIN) 500 MCG tablet Take 500 mcg by mouth every evening.    . Carboxymethylcellulose Sodium (THERATEARS) 0.25 % SOLN Place 1 drop into both  eyes 3 (three) times daily as needed (dry/irritated eyes.).     No facility-administered medications prior to visit.    PAST MEDICAL HISTORY: Past Medical History:  Diagnosis Date  . AAA (abdominal aortic aneurysm) ([place])   . ANA positive 08/06/2016  . 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 Asthma   . Complication of anesthesia    trouble breathing after back surgery  . 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 Carloyn Manner  . Degenerative joint disease of spine    buttocks pain -remains a problem  . Family history of adverse reaction to anesthesia    one sister nasea and vomiting  . Former smoker 08/13/2016  . GERD (gastroesophageal reflux disease)    occ  . High risk medication use 08/06/2016  . 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 Hyperlipidemia   . Hypothyroidism   . Idiopathic peripheral neuropathy 08/13/2016  . Lupus ([place])    Dr. Aura Fey  . Neuromuscular disorder ([facility])    neuropathy in feet  . PONV (postoperative nausea and vomiting)   . Primary osteoarthritis of both feet 08/06/2016  . Primary osteoarthritis of both hands 08/06/2016  . Primary osteoarthritis of right hip 03/24/2015  . Raynaud's disease without gangrene 08/06/2016  . Shortness of breath dyspnea    uses inhaler occ    PAST SURGICAL HISTORY: Past Surgical History:  Procedure Laterality Date  . ABDOMINAL HYSTERECTOMY    . BACK SURGERY  02   L4-L5- fusion  . BRONCHIAL BRUSHINGS  08/07/2019   Procedure: BRONCHIAL BRUSHINGS;  Surgeon: Julian Hy, DO;  Location: [facility];  Service: Endoscopy;;  . BRONCHIAL WASHINGS  08/07/2019   Procedure: BRONCHIAL WASHINGS;  Surgeon: Julian Hy, DO;  Location: [facility];  Service: Endoscopy;;  . COLONOSCOPY WITH PROPOFOL N/A 03/17/2016   Procedure: COLONOSCOPY WITH PROPOFOL;  Surgeon: Garlan Fair, MD;  Location: [facility];  Service: Endoscopy;  Laterality: N/A;  . ESOPHAGOGASTRODUODENOSCOPY (EGD) WITH PROPOFOL N/A  03/17/2016   Procedure: ESOPHAGOGASTRODUODENOSCOPY (EGD) WITH PROPOFOL;  Surgeon: Garlan Fair, MD;  Location: [facility];  Service: Endoscopy;  Laterality: N/A;  . JOINT REPLACEMENT    . TONSILLECTOMY AND ADENOIDECTOMY    . TOTAL HIP ARTHROPLASTY Right 03/25/2015   Procedure: TOTAL HIP ARTHROPLASTY ANTERIOR APPROACH;  Surgeon: Frederik Pear, MD;  Location: [facility];  Service: Orthopedics;  Laterality: Right;  Marland Kitchen VIDEO BRONCHOSCOPY N/A 08/07/2019   Procedure: VIDEO BRONCHOSCOPY WITHOUT FLUORO;  Surgeon: Julian Hy, DO;  Location: [facility];  Service: Endoscopy;  Laterality: N/A;    FAMILY HISTORY: Family History  Problem Relation Age of Onset  . Diabetes Mother   . Heart disease Mother   . Heart attack Mother   . Bleeding Disorder Father   . Peptic Ulcer Father   . Heart disease Sister   . Heart attack Sister   . Cancer Sister   .  Deep vein thrombosis Sister   . Diabetes Sister   . Hyperlipidemia Sister   . Hypertension Sister   . Varicose Veins Sister   . AAA (abdominal aortic aneurysm) Sister   . Bleeding Disorder Sister   . Breast cancer Sister   . Heart disease Brother   . Heart attack Brother   . Hypertension Brother     SOCIAL HISTORY: Social History   Socioeconomic History  . Marital status: Married    Spouse name: Not on file  . Number of children: 3  . Years of education: Not on file  . Highest education level: Some college, no degree  Occupational History  . Occupation: Retired  Tobacco Use  . Smoking status: Former Smoker    Packs/day: 1.50    Years: 35.00    Pack years: 52.50    Types: Cigarettes    Quit date: 05/11/2000    Years since quitting: 19.4  . Smokeless tobacco: Never Used  Vaping Use  . Vaping Use: Never used  Substance and Sexual Activity  . Alcohol use: Never    Alcohol/week: 0.0 standard drinks  . Drug use: Never  . Sexual activity: Not on file  Other Topics Concern  . Not on file  Social History Narrative   Right handed    2 cups of caffeine daily   Lives at home with husband and granddaughter    Social Determinants of Health   Financial Resource Strain:   . Difficulty of Paying Living Expenses:   Food Insecurity:   . Worried About Charity fundraiser in the Last Year:   . Arboriculturist in the Last Year:   Transportation Needs:   . Film/video editor (Medical):   Marland Kitchen Lack of Transportation (Non-Medical):   Physical Activity:   . Days of Exercise per Week:   . Minutes of Exercise per Session:   Stress:   . Feeling of Stress :   Social Connections:   . Frequency of Communication with Friends and Family:   . Frequency of Social Gatherings with Friends and Family:   . Attends Religious Services:   . Active Member of Clubs or Organizations:   . Attends Archivist Meetings:   Marland Kitchen Marital Status:   Intimate Partner Violence:   . Fear of Current or Ex-Partner:   . Emotionally Abused:   Marland Kitchen Physically Abused:   . Sexually Abused:       PHYSICAL EXAM  Vitals:   10/18/19 1051  BP: (!) 146/68  Pulse: 67  Weight: 145 lb (65.8 kg)  Height: 5' 3.5" (1.613 m)   Body mass index is 25.28 kg/m.  Generalized: Well developed, in no acute distress  Cardiology: normal rate and rhythm, no murmur noted Respiratory: clear to auscultation bilaterally  Neurological examination  Mentation: Alert oriented to time, place, history taking. Follows all commands speech and language fluent Cranial nerve II-XII: Pupils were equal round reactive to light. Extraocular movements were full, visual field were full on confrontational test. Facial sensation and strength were normal. Uvula tongue midline. Head turning and shoulder shrug  were normal and symmetric. Motor: The motor testing reveals 5 over 5 strength of all 4 extremities. Good symmetric motor tone is noted throughout.  Sensory: Sensory testing is intact to soft touch on all 4 extremities. No evidence of extinction is noted.  Coordination: Cerebellar  testing reveals good finger-nose-finger and heel-to-shin bilaterally.  Gait and station: Gait is normal. Tandem gait is normal. Romberg is  negative. No drift is seen.  Reflexes: Deep tendon reflexes are symmetric and normal bilaterally.   DIAGNOSTIC DATA (LABS, IMAGING, TESTING) - I reviewed patient records, labs, notes, testing and imaging myself where available.  No flowsheet data found.   Lab Results  Component Value Date   WBC 5.5 10/10/2019   HGB 10.3 (L) 10/10/2019   HCT 31.2 (L) 10/10/2019   MCV 94.8 10/10/2019   PLT 327 10/10/2019      Component Value Date/Time   NA 138 10/10/2019 1118   K 4.8 10/10/2019 1118   CL 105 10/10/2019 1118   CO2 27 10/10/2019 1118   GLUCOSE 75 10/10/2019 1118   BUN 19 10/10/2019 1118   CREATININE 0.83 10/10/2019 1118   CALCIUM 9.7 10/10/2019 1118   PROT 6.7 10/10/2019 1118   PROT 6.9 06/27/2018 1141   ALBUMIN 4.2 07/21/2019 1127   AST 15 10/10/2019 1118   ALT 15 10/10/2019 1118   ALKPHOS 48 07/21/2019 1127   BILITOT 0.6 10/10/2019 1118   GFRNONAA 67 10/10/2019 1118   GFRAA 78 10/10/2019 1118   No results found for: CHOL, HDL, LDLCALC, LDLDIRECT, TRIG, CHOLHDL No results found for: HGBA1C Lab Results  Component Value Date   VITAMINB12 1,721 (H) 06/27/2018   No results found for: TSH     ASSESSMENT AND PLAN 79 y.o. year old female  has a past medical history of AAA (abdominal aortic aneurysm) ([place]), ANA positive (08/06/2016), Aortic ectasia, abdominal ([facility]) (0/99/8338), Asthma, Complication of anesthesia, DDD lumbar spine (08/13/2016), Degenerative joint disease of spine, Family history of adverse reaction to anesthesia, Former smoker (08/13/2016), GERD (gastroesophageal reflux disease), High risk medication use (08/06/2016), History of total hip replacement, right (08/13/2016), Hyperlipidemia, Hypothyroidism, Idiopathic peripheral neuropathy (08/13/2016), Lupus ([place]), Neuromuscular disorder ([place]), PONV (postoperative nausea and vomiting),  Primary osteoarthritis of both feet (08/06/2016), Primary osteoarthritis of both hands (08/06/2016), Primary osteoarthritis of right hip (03/24/2015), Raynaud's disease without gangrene (08/06/2016), and Shortness of breath dyspnea. here with     ICD-10-CM   1. Idiopathic peripheral neuropathy  G60.9   2. Cramp of both lower extremities  R25.2   3. Chronic anemia  D64.9      Ashaya continues to notes intermittent cramping of bilateral lower extremities.  Neuropathy pain seems stable.  We will continue gabapentin 60 mg 3 times daily.  I will have her restart levetiracetam 250 mg twice daily.  She will use hydrocodone and meloxicam as needed.  She was advised against regular use of these medications if possible.  We have also discussed considering alpha lipoic acid for neuropathy pain if needed.  She will continue close follow-up with her primary care for monitoring of anemia and other comorbidities.  She was encouraged to drink plenty of water.  She will stay active.  She will follow-up in 6 months, sooner if needed.  She verbalizes understanding and agreement with this plan.  No orders of the defined types were placed in this encounter.    No orders of the defined types were placed in this encounter.     I spent 15 minutes with the patient. 50% of this time was spent counseling and educating patient on plan of care and medications.    Debbora Presto, FNP-C 10/19/2019, 3:59 PM [facility] [address] [place], [place] [address] [phone]

## 2019-10-18 NOTE — Patient Instructions (Signed)
We will continue gabapentin 600mg  three times daily. Start levetiracetam 250mg  twice daily. Use hydrocodone and meloxicam as directed. Try to avoid regular use if you can.   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 Please follow up closely with primary care for monitoring of anemia and other co morbidities. Try to drink 50 ounces of water daily. Stay active.   Follow up with Dr Jannifer Franklin in 6 months, sooner if needed.    Peripheral Neuropathy Peripheral neuropathy is a type of nerve damage. It affects nerves that carry signals between the spinal cord and the arms, legs, and the rest of the body (peripheral nerves). It does not affect nerves in the spinal cord or brain. In peripheral neuropathy, one nerve or a group of nerves may be damaged. Peripheral neuropathy is a broad category that includes many specific nerve disorders, like diabetic neuropathy, hereditary neuropathy, and carpal tunnel syndrome. What are the causes? This condition may be caused by:  Diabetes. This is the most common cause of peripheral neuropathy.  Nerve injury.  Pressure or stress on a nerve that lasts a long time.  Lack (deficiency) of B vitamins. This can result from alcoholism, poor diet, or a restricted diet.  Infections.  Autoimmune diseases, such as rheumatoid arthritis and systemic lupus erythematosus.  Nerve diseases that are passed from parent to child (inherited).  Some medicines, such as cancer medicines (chemotherapy).  Poisonous (toxic) substances, such as lead and mercury.  Too little blood flowing to the legs.  Kidney disease.  Thyroid disease. In some cases, the cause of this condition is not known. What are the signs or symptoms? Symptoms of this condition depend on which of your nerves is damaged. Common symptoms include:  Loss of feeling (numbness) in the feet, hands, or both.  Tingling in the feet, hands, or both.  Burning pain.  Very sensitive  skin.  Weakness.  Not being able to move a part of the body (paralysis).  Muscle twitching.  Clumsiness or poor coordination.  Loss of balance.  Not being able to control your bladder.  Feeling dizzy.  Sexual problems. How is this diagnosed? Diagnosing and finding the cause of peripheral neuropathy can be difficult. Your health care provider will take your medical history and do a physical exam. A neurological exam will also be done. This involves checking things that are affected by your brain, spinal cord, and nerves (nervous system). For example, your health care provider will check your reflexes, how you move, and what you can feel. You may have other tests, such as:  Blood tests.  Electromyogram (EMG) and nerve conduction tests. These tests check nerve function and how well the nerves are controlling the muscles.  Imaging tests, such as CT scans or MRI to rule out other causes of your symptoms.  Removing a small piece of nerve to be examined in a lab (nerve biopsy). This is rare.  Removing and examining a small amount of the fluid that surrounds the brain and spinal cord (lumbar puncture). This is rare. How is this treated? Treatment for this condition may involve:  Treating the underlying cause of the neuropathy, such as diabetes, kidney disease, or vitamin deficiencies.  Stopping medicines that can cause neuropathy, such as chemotherapy.  Medicine to relieve pain. Medicines may include: ? Prescription or over-the-counter pain medicine. ? Antiseizure medicine. ? Antidepressants. ? Pain-relieving patches that are applied to painful areas of skin.  Surgery to relieve pressure on a nerve or to destroy a nerve  that is causing pain.  Physical therapy to help improve movement and balance.  Devices to help you move around (assistive devices). Follow these instructions at home: Medicines  Take over-the-counter and prescription medicines only as told by your health  care provider. Do not take any other medicines without first asking your health care provider.  Do not drive or use heavy machinery while taking prescription pain medicine. Lifestyle   Do not use any products that contain nicotine or tobacco, such as cigarettes and e-cigarettes. Smoking keeps blood from reaching damaged nerves. If you need help quitting, ask your health care provider.  Avoid or limit alcohol. Too much alcohol can cause a vitamin B deficiency, and vitamin B is needed for healthy nerves.  Eat a healthy diet. This includes: ? Eating foods that are high in fiber, such as fresh fruits and vegetables, whole grains, and beans. ? Limiting foods that are high in fat and processed sugars, such as fried or sweet foods. General instructions   If you have diabetes, work closely with your health care provider to keep your blood sugar under control.  If you have numbness in your feet: ? Check every day for signs of injury or infection. Watch for redness, warmth, and swelling. ? Wear padded socks and comfortable shoes. These help protect your feet.  Develop a good support system. Living with peripheral neuropathy can be stressful. Consider talking with a mental health specialist or joining a support group.  Use assistive devices and attend physical therapy as told by your health care provider. This may include using a walker or a cane.  Keep all follow-up visits as told by your health care provider. This is important. Contact a health care provider if:  You have new signs or symptoms of peripheral neuropathy.  You are struggling emotionally from dealing with peripheral neuropathy.  Your pain is not well-controlled. Get help right away if:  You have an injury or infection that is not healing normally.  You develop new weakness in an arm or leg.  You fall frequently. Summary  Peripheral neuropathy is when the nerves in the arms, or legs are damaged, resulting in numbness,  weakness, or pain.  There are many causes of peripheral neuropathy, including diabetes, pinched nerves, vitamin deficiencies, autoimmune disease, and hereditary conditions.  Diagnosing and finding the cause of peripheral neuropathy can be difficult. Your health care provider will take your medical history, do a physical exam, and do tests, including blood tests and nerve function tests.  Treatment involves treating the underlying cause of the neuropathy and taking medicines to help control pain. Physical therapy and assistive devices may also help. This information is not intended to replace advice given to you by your health care provider. Make sure you discuss any questions you have with your health care provider. Document Revised: 04/09/2017 Document Reviewed: 07/06/2016 Elsevier Patient Education  [place].   Levetiracetam tablets What is this medicine? LEVETIRACETAM (lee ve tye RA se tam) is an antiepileptic drug. It is used with other medicines to treat certain types of seizures. This medicine may be used for other purposes; ask your health care provider or pharmacist if you have questions. COMMON BRAND NAME(S): Keppra, Roweepra What should I tell my health care provider before I take this medicine? They need to know if you have any of these conditions:  kidney disease  suicidal thoughts, plans, or attempt; a previous suicide attempt by you or a family member  an unusual or allergic reaction  to levetiracetam, other medicines, foods, dyes, or preservatives  pregnant or trying to get pregnant  breast-feeding How should I use this medicine? Take this medicine by mouth with a glass of water. Follow the directions on the prescription label. Swallow the tablets whole. Do not crush or chew this medicine. You may take this medicine with or without food. Take your doses at regular intervals. Do not take your medicine more often than directed. Do not stop taking this medicine or  any of your seizure medicines unless instructed by your doctor or health care professional. Stopping your medicine suddenly can increase your seizures or their severity. A special MedGuide will be given to you by the pharmacist with each prescription and refill. Be sure to read this information carefully each time. Contact your pediatrician or health care professional regarding the use of this medication in children. While this drug may be prescribed for children as young as 82 years of age for selected conditions, precautions do apply. Overdosage: If you think you have taken too much of this medicine contact a poison control center or emergency room at once. NOTE: This medicine is only for you. Do not share this medicine with others. What if I miss a dose? If you miss a dose, take it as soon as you can. If it is almost time for your next dose, take only that dose. Do not take double or extra doses. What may interact with this medicine? This medicine may interact with the following medications:  carbamazepine  colesevelam  probenecid  sevelamer This list may not describe all possible interactions. Give your health care provider a list of all the medicines, herbs, non-prescription drugs, or dietary supplements you use. Also tell them if you smoke, drink alcohol, or use illegal drugs. Some items may interact with your medicine. What should I watch for while using this medicine? Visit your doctor or health care provider for a regular check on your progress. Wear a medical identification bracelet or chain to say you have epilepsy, and carry a card that lists all your medications. This medicine may cause serious skin reactions. They can happen weeks to months after starting the medicine. Contact your health care provider right away if you notice fevers or flu-like symptoms with a rash. The rash may be red or purple and then turn into blisters or peeling of the skin. Or, you might notice a red rash with  swelling of the face, lips or lymph nodes in your neck or under your arms. It is important to take this medicine exactly as instructed by your health care provider. When first starting treatment, your dose may need to be adjusted. It may take weeks or months before your dose is stable. You should contact your doctor or health care provider if your seizures get worse or if you have any new types of seizures. You may get drowsy or dizzy. Do not drive, use machinery, or do anything that needs mental alertness until you know how this medicine affects you. Do not stand or sit up quickly, especially if you are an older patient. This reduces the risk of dizzy or fainting spells. Alcohol may interfere with the effect of this medicine. Avoid alcoholic drinks. The use of this medicine may increase the chance of suicidal thoughts or actions. Pay special attention to how you are responding while on this medicine. Any worsening of mood, or thoughts of suicide or dying should be reported to your health care provider right away. Women who become  pregnant while using this medicine may enroll in the [place] Pregnancy Registry by calling [phone]. This registry collects information about the safety of antiepileptic drug use during pregnancy. What side effects may I notice from receiving this medicine? Side effects that you should report to your doctor or health care professional as soon as possible:  allergic reactions like skin rash, itching or hives, swelling of the face, lips, or tongue  breathing problems  dark urine  general ill feeling or flu-like symptoms  problems with balance, talking, walking  rash, fever, and swollen lymph nodes  redness, blistering, peeling or loosening of the skin, including inside the mouth  unusually weak or tired  worsening of mood, thoughts or actions of suicide or dying  yellowing of the eyes or skin Side effects that usually do not require  medical attention (report to your doctor or health care professional if they continue or are bothersome):  diarrhea  dizzy, drowsy  headache  loss of appetite This list may not describe all possible side effects. Call your doctor for medical advice about side effects. You may report side effects to FDA at 1-800-FDA-1088. Where should I keep my medicine? Keep out of reach of children. Store at room temperature between 15 and 30 degrees C (59 and 86 degrees F). Throw away any unused medicine after the expiration date. NOTE: This sheet is a summary. It may not cover all possible information. If you have questions about this medicine, talk to your doctor, pharmacist, or health care provider.  2020 Elsevier/Gold Standard (2018-07-29 15:23:36)

## 2019-10-19 ENCOUNTER — Encounter: Payer: Self-pay | Admitting: Family Medicine

## 2019-10-19 NOTE — Progress Notes (Signed)
I have read the note, and I agree with the clinical assessment and plan.  Charles K Willis   

## 2019-10-31 ENCOUNTER — Other Ambulatory Visit: Payer: Self-pay | Admitting: Family Medicine

## 2019-10-31 DIAGNOSIS — M8580 Other specified disorders of bone density and structure, unspecified site: Secondary | ICD-10-CM

## 2019-11-01 ENCOUNTER — Telehealth: Payer: Self-pay | Admitting: Family Medicine

## 2019-11-01 MED ORDER — HYDROCODONE-ACETAMINOPHEN 5-325 MG PO TABS: 1.0000 | ORAL_TABLET | Freq: Four times a day (QID) | ORAL | 0 refills | Status: DC | PRN

## 2019-11-01 NOTE — Telephone Encounter (Signed)
Pt is needing a refill on her HYDROcodone-acetaminophen (NORCO/VICODIN) 5-325 MG tablet sent in to the [facility] on [address]  

## 2019-11-01 NOTE — Telephone Encounter (Signed)
The hydrocodone will be refilled. 

## 2019-11-22 ENCOUNTER — Encounter: Payer: Self-pay | Admitting: Critical Care Medicine

## 2019-11-22 ENCOUNTER — Ambulatory Visit (INDEPENDENT_AMBULATORY_CARE_PROVIDER_SITE_OTHER): Payer: Medicare Other | Admitting: Critical Care Medicine

## 2019-11-22 ENCOUNTER — Other Ambulatory Visit: Payer: Self-pay

## 2019-11-22 VITALS — BP 120/64 | HR 78 | Temp 98.4°F | Ht 63.5 in | Wt 145.8 lb

## 2019-11-22 DIAGNOSIS — J449 Chronic obstructive pulmonary disease, unspecified: Secondary | ICD-10-CM

## 2019-11-22 DIAGNOSIS — Z87891 Personal history of nicotine dependence: Secondary | ICD-10-CM | POA: Diagnosis not present

## 2019-11-22 DIAGNOSIS — R918 Other nonspecific abnormal finding of lung field: Secondary | ICD-10-CM

## 2019-11-22 NOTE — Progress Notes (Signed)
Synopsis: Referred in March 2021 for shortness of breath by Alroy Dust, L.Marlou Sa, MD.  Subjective:   PATIENT ID: Janet Giles GENDER: female DOB: [birth year], MRN: [id number]  Chief Complaint  Patient presents with  . Follow-up    SOB/ cough/ wheezing improved since last visit    Janet Giles is a 79 year old woman who presents for follow-up of COPD.  She has been on Bevespi since late May with significant improvement in her symptoms of shortness of breath, wheezing, cough.  She has more energy in general.  She is using her albuterol much less frequently.  She has been taking iron supplements but showed normal ferritin level that her PCP had checked.  Her weight has been stable.  She has active doing gardening.    Ov 08/29/19: Janet Giles is a 79 y/o woman who presents for follow-up from chronic dyspnea on exertion and abnormal CT.  She underwent bronchoscopy on 3/29 and all cultures so far negative.  At her last visit she was started on iron supplements which have helped her fatigue, but not her shortness of breath.  She has not been able to switch from Flovent to Symbicort due to inability to afford it on her insurance.  She has ongoing dyspnea on exertion and wheezing but no cough or sputum production.  She has no previous history of asthma prior to the last few years.  No history of allergies.  She has a significant tobacco history but is no longer smoking.   OV 07/21/19: Janet Giles is a 79 year old woman who presents for evaluation of shortness of breath since 2015.  She has a history of chronic asthma, which has been worse recently.  She saw her cardiologist Dr. Johnsie Cancel on 06/28/2019-note reviewed.  He felt that her cardiac studies did not explain the degree of dyspnea she is having.  She has been taking Flovent twice daily for several years for asthma, but is unsure if it has ever improved her symptoms.  She uses albuterol several times per day with improvement in her symptoms, but  seldom previously required it.  She has chest tightness, coughing, and wheezing, but no sputum production.  Her symptoms are worse first thing in the morning, but improve throughout the day.  She has no significant nighttime symptoms that wake her up.  She has no history of allergies.  No nasal congestion, postnasal drip, sneezing, soft palate or throat itching.  No fever, chills, sweats, weight loss, change in appetite.  She has significant fatigue all the time.  No history of bleeding or bruising, not taking any anticoagulation or antiplatelet medications.  She has a history of anemia.  She has a history of lupus, but is not currently on antirheumatic medications.  She follows with Dr. Estanislado Pandy from rheumatology.  Has a history of tobacco abuse, 25 years x 1.5 packs/day before quitting in 2002.    Past Medical History:  Diagnosis Date  . AAA (abdominal aortic aneurysm) ([place])   . ANA positive 08/06/2016  . 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 Asthma   . Complication of anesthesia    trouble breathing after back surgery  . 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 Carloyn Manner  . Degenerative joint disease of spine    buttocks pain -remains a problem  . Family history of adverse reaction to anesthesia    one sister nasea and vomiting  . Former smoker 08/13/2016  . GERD (gastroesophageal reflux disease)    occ  . High  risk medication use 08/06/2016  . 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 Hyperlipidemia   . Hypothyroidism   . Idiopathic peripheral neuropathy 08/13/2016  . Lupus ([place])    Dr. Aura Fey  . Neuromuscular disorder ([facility])    neuropathy in feet  . PONV (postoperative nausea and vomiting)   . Primary osteoarthritis of both feet 08/06/2016  . Primary osteoarthritis of both hands 08/06/2016  . Primary osteoarthritis of right hip 03/24/2015  . Raynaud's disease without gangrene 08/06/2016  . Shortness of breath dyspnea    uses inhaler occ      Family History  Problem Relation Age of Onset  . Diabetes Mother   . Heart disease Mother   . Heart attack Mother   . Bleeding Disorder Father   . Peptic Ulcer Father   . Heart disease Sister   . Heart attack Sister   . Cancer Sister   . Deep vein thrombosis Sister   . Diabetes Sister   . Hyperlipidemia Sister   . Hypertension Sister   . Varicose Veins Sister   . AAA (abdominal aortic aneurysm) Sister   . Bleeding Disorder Sister   . Breast cancer Sister   . Heart disease Brother   . Heart attack Brother   . Hypertension Brother      Past Surgical History:  Procedure Laterality Date  . ABDOMINAL HYSTERECTOMY    . BACK SURGERY  02   L4-L5- fusion  . BRONCHIAL BRUSHINGS  08/07/2019   Procedure: BRONCHIAL BRUSHINGS;  Surgeon: Julian Hy, DO;  Location: [facility];  Service: Endoscopy;;  . BRONCHIAL WASHINGS  08/07/2019   Procedure: BRONCHIAL WASHINGS;  Surgeon: Julian Hy, DO;  Location: [facility];  Service: Endoscopy;;  . COLONOSCOPY WITH PROPOFOL N/A 03/17/2016   Procedure: COLONOSCOPY WITH PROPOFOL;  Surgeon: Garlan Fair, MD;  Location: [facility];  Service: Endoscopy;  Laterality: N/A;  . ESOPHAGOGASTRODUODENOSCOPY (EGD) WITH PROPOFOL N/A 03/17/2016   Procedure: ESOPHAGOGASTRODUODENOSCOPY (EGD) WITH PROPOFOL;  Surgeon: Garlan Fair, MD;  Location: [facility];  Service: Endoscopy;  Laterality: N/A;  . JOINT REPLACEMENT    . TONSILLECTOMY AND ADENOIDECTOMY    . TOTAL HIP ARTHROPLASTY Right 03/25/2015   Procedure: TOTAL HIP ARTHROPLASTY ANTERIOR APPROACH;  Surgeon: Frederik Pear, MD;  Location: [facility];  Service: Orthopedics;  Laterality: Right;  Marland Kitchen VIDEO BRONCHOSCOPY N/A 08/07/2019   Procedure: VIDEO BRONCHOSCOPY WITHOUT FLUORO;  Surgeon: Julian Hy, DO;  Location: [facility];  Service: Endoscopy;  Laterality: N/A;    Social History   Socioeconomic History  . Marital status: Married    Spouse name: Not on file  . Number of children: 3  .  Years of education: Not on file  . Highest education level: Some college, no degree  Occupational History  . Occupation: Retired  Tobacco Use  . Smoking status: Former Smoker    Packs/day: 1.50    Years: 35.00    Pack years: 52.50    Types: Cigarettes    Quit date: 05/11/2000    Years since quitting: 19.5  . Smokeless tobacco: Never Used  Vaping Use  . Vaping Use: Never used  Substance and Sexual Activity  . Alcohol use: Never    Alcohol/week: 0.0 standard drinks  . Drug use: Never  . Sexual activity: Not on file  Other Topics Concern  . Not on file  Social History Narrative   Right handed   2 cups of caffeine daily   Lives at home with husband  and granddaughter    Social Determinants of Health   Financial Resource Strain:   . Difficulty of Paying Living Expenses:   Food Insecurity:   . Worried About Charity fundraiser in the Last Year:   . Arboriculturist in the Last Year:   Transportation Needs:   . Film/video editor (Medical):   Marland Kitchen Lack of Transportation (Non-Medical):   Physical Activity:   . Days of Exercise per Week:   . Minutes of Exercise per Session:   Stress:   . Feeling of Stress :   Social Connections:   . Frequency of Communication with Friends and Family:   . Frequency of Social Gatherings with Friends and Family:   . Attends Religious Services:   . Active Member of Clubs or Organizations:   . Attends Archivist Meetings:   Marland Kitchen Marital Status:   Intimate Partner Violence:   . Fear of Current or Ex-Partner:   . Emotionally Abused:   Marland Kitchen Physically Abused:   . Sexually Abused:      Allergies  Allergen Reactions  . Wasp Venom Shortness Of Breath and Swelling  . Doxycycline Nausea And Vomiting     Immunization History  Administered Date(s) Administered  . Fluad Quad(high Dose 65+) 02/20/2019  . PFIZER SARS-COV-2 Vaccination 05/24/2019, 06/14/2019    Outpatient Medications Prior to Visit  Medication Sig Dispense Refill  .  albuterol (PROVENTIL HFA;VENTOLIN HFA) 108 (90 BASE) MCG/ACT inhaler Inhale 1 puff into the lungs every 6 (six) hours as needed for wheezing or shortness of breath.    . ferrous gluconate (FERGON) 324 MG tablet Take 1 tablet (324 mg total) by mouth 2 (two) times daily with a meal. 60 tablet 3  . gabapentin (NEURONTIN) 600 MG tablet TAKE 1 TABLET BY MOUTH THREE TIMES DAILY 270 tablet 1  . Glycopyrrolate-Formoterol (BEVESPI AEROSPHERE) 9-4.8 MCG/ACT AERO Inhale 2 puffs into the lungs 2 (two) times daily. 10.7 g 11  . HYDROcodone-acetaminophen (NORCO/VICODIN) 5-325 MG tablet Take 1 tablet by mouth every 6 (six) hours as needed for moderate pain. Must last 28 days 60 tablet 0  . levothyroxine (SYNTHROID, LEVOTHROID) 75 MCG tablet Take 75 mcg by mouth at bedtime.     . Biotin 5000 MCG CAPS Take 5,000 mcg by mouth every evening. (Patient not taking: Reported on 11/22/2019)    . Calcium Carb-Cholecalciferol (CALCIUM + D3 PO) Take 1 tablet by mouth daily. (Patient not taking: Reported on 11/22/2019)    . vitamin B-12 (CYANOCOBALAMIN) 500 MCG tablet Take 500 mcg by mouth every evening. (Patient not taking: Reported on 11/22/2019)     No facility-administered medications prior to visit.    Review of Systems  Constitutional: Positive for malaise/fatigue. Negative for chills, fever and weight loss.  HENT: Negative for congestion.        Hoarseness  Respiratory: Positive for cough and shortness of breath. Negative for sputum production.   Cardiovascular: Negative for chest pain and leg swelling.  Endo/Heme/Allergies: Negative for environmental allergies. Does not bruise/bleed easily.     Objective:   Vitals:   11/22/19 1030  BP: 120/64  Pulse: 78  Temp: 98.4 F (36.9 C)  TempSrc: Oral  SpO2: 98%  Weight: 145 lb 12.8 oz (66.1 kg)  Height: 5' 3.5" (1.613 m)   98% on   RA BMI Readings from Last 3 Encounters:  11/22/19 25.42 kg/m  10/18/19 25.28 kg/m  10/10/19 25.16 kg/m   Wt Readings from  Last 3 Encounters:  11/22/19 145 lb 12.8 oz (66.1 kg)  10/18/19 145 lb (65.8 kg)  10/10/19 146 lb 9.6 oz (66.5 kg)    Physical Exam Vitals reviewed.  Constitutional:      General: She is not in acute distress.    Appearance: She is not ill-appearing.  HENT:     Head: Normocephalic and atraumatic.  Eyes:     General: No scleral icterus. Cardiovascular:     Rate and Rhythm: Normal rate and regular rhythm.  Pulmonary:     Comments: Breathing comfortably on room air, no conversational dyspnea.  Clear to auscultation bilaterally. Abdominal:     General: There is no distension.     Palpations: Abdomen is soft.     Tenderness: There is no abdominal tenderness.  Musculoskeletal:        General: No swelling or deformity.     Cervical back: Neck supple.  Lymphadenopathy:     Cervical: No cervical adenopathy.  Skin:    General: Skin is warm and dry.     Findings: No rash.  Neurological:     General: No focal deficit present.     Mental Status: She is alert.     Coordination: Coordination normal.  Psychiatric:        Mood and Affect: Mood normal.        Behavior: Behavior normal.      CBC    Component Value Date/Time   WBC 5.5 10/10/2019 1118   RBC 3.29 (L) 10/10/2019 1118   HGB 10.3 (L) 10/10/2019 1118   HCT 31.2 (L) 10/10/2019 1118   PLT 327 10/10/2019 1118   MCV 94.8 10/10/2019 1118   MCH 31.3 10/10/2019 1118   MCHC 33.0 10/10/2019 1118   RDW 12.5 10/10/2019 1118   LYMPHSABS 1,837 10/10/2019 1118   MONOABS 0.4 07/21/2019 1127   EOSABS 187 10/10/2019 1118   BASOSABS 22 10/10/2019 1118    CHEMISTRY No results for input(s): NA, K, CL, CO2, GLUCOSE, BUN, CREATININE, CALCIUM, MG, PHOS in the last 168 hours. CrCl cannot be calculated (Patient's most recent lab result is older than the maximum 21 days allowed.).   06/27/2018 labs: IgG 1030 IgM 71 IgA 253  IgE 82 on 07/21/2019  02/14/2018 ANA positive 1: 40, nuclear homogenous pattern dsDNA antibody normal C3  &  C4 normal  Micro: 08/07/2019 BAL fungus-penicillium species 08/07/2019 BAL AFB-negative 08/07/2019 BAL respiratory-negative 08/07/2019 BAL fungus lingula-negative 08/07/2019 BAL AFB lingula-negative 08/07/2019 BAL respiratory-negative   Chest Imaging- films reviewed: CT chest 07/05/2019-mild bronchiectasis in all lobes, right upper lobe peripheral tree-in-bud opacities, associated micronodules.  No significant nodularity in RML, minimal distal inferior lingular nodules. Linear scarring bilateral lower lobes.  Borderline mediastinal adenopathy.  CT chest 09/25/2019- Apical irregular nodules and tree-in-bud opacities.  Emphysema and airway thickening.  Pleural thickening of major fissure on the left.  All nodules shrinking other than nodule in azygo-esophageal recess, which is stable.  Senescent vascular calcification.  Pulmonary Functions Testing Results: PFT Results Latest Ref Rng & Units 09/04/2019 01/09/2014  FVC-Pre L 1.72 2.01  FVC-Predicted Pre % 64 69  FVC-Post L 2.06 2.33  FVC-Predicted Post % 77 81  Pre FEV1/FVC % % 60 63  Post FEV1/FCV % % 55 60  FEV1-Pre L 1.03 1.26  FEV1-Predicted Pre % 52 58  FEV1-Post L 1.14 1.39  DLCO UNC% % 64 48  DLCO COR %Predicted % 93 69  TLC L 4.90 4.45  TLC % Predicted % 96 88  RV % Predicted % 134  105   2015- moderate obstruction with bronchodilator reversibility.  No air trapping or hyperinflation.  Moderate diffusion impairment.   Echocardiogram 10/06/2018: LVEF 55 to 97%, grade 1 diastolic dysfunction with elevated LVEDP.  Normal LA, RV, RA.  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  Mild MR.      Assessment & Plan:     ICD-10-CM   1. Chronic obstructive pulmonary disease, unspecified COPD type ([place])  J44.9 CT Chest Wo Contrast  2. Abnormal CT scan, lung  R91.8 CT Chest Wo Contrast  3. History of tobacco abuse  Z87.891 CT Chest Wo Contrast     Dyspnea on exertion due to COPD.  -Continue Bevespi  twice daily.  Rinse her mouth after the use. -Continue albuterol as needed, up to every 4 hours -Up-to-date on seasonal flu, Covid vaccines.  Needs pneumonia vaccines if she has not previously had these through her PCP.  Abnormal CT concerning for chronic infection, mild multi- lobar bronchiectasis.  Abnormalities likely due to mucous plugging rather than an infectious etiology based on cultures. 61m RUL nodule. -Follow-up CT scan November 2021 -Agree with ongoing smoking cessation.   RTC in 5 months after CT scan.   Current Outpatient Medications:  .  albuterol (PROVENTIL HFA;VENTOLIN HFA) 108 (90 BASE) MCG/ACT inhaler, Inhale 1 puff into the lungs every 6 (six) hours as needed for wheezing or shortness of breath., Disp: , Rfl:  .  ferrous gluconate (FERGON) 324 MG tablet, Take 1 tablet (324 mg total) by mouth 2 (two) times daily with a meal., Disp: 60 tablet, Rfl: 3 .  gabapentin (NEURONTIN) 600 MG tablet, TAKE 1 TABLET BY MOUTH THREE TIMES DAILY, Disp: 270 tablet, Rfl: 1 .  Glycopyrrolate-Formoterol (BEVESPI AEROSPHERE) 9-4.8 MCG/ACT AERO, Inhale 2 puffs into the lungs 2 (two) times daily., Disp: 10.7 g, Rfl: 11 .  HYDROcodone-acetaminophen (NORCO/VICODIN) 5-325 MG tablet, Take 1 tablet by mouth every 6 (six) hours as needed for moderate pain. Must last 28 days, Disp: 60 tablet, Rfl: 0 .  levothyroxine (SYNTHROID, LEVOTHROID) 75 MCG tablet, Take 75 mcg by mouth at bedtime. , Disp: , Rfl:  .  Biotin 5000 MCG CAPS, Take 5,000 mcg by mouth every evening. (Patient not taking: Reported on 11/22/2019), Disp: , Rfl:  .  Calcium Carb-Cholecalciferol (CALCIUM + D3 PO), Take 1 tablet by mouth daily. (Patient not taking: Reported on 11/22/2019), Disp: , Rfl:  .  vitamin B-12 (CYANOCOBALAMIN) 500 MCG tablet, Take 500 mcg by mouth every evening. (Patient not taking: Reported on 11/22/2019), Disp: , Rfl:     LJulian Hy DO [facility] 11/22/2019 10:34 AM

## 2019-11-22 NOTE — Patient Instructions (Addendum)
Thank you for visiting Dr. Carlis Abbott at [facility]. We recommend the following: Orders Placed This Encounter  Procedures  . CT Chest Wo Contrast   Orders Placed This Encounter  Procedures  . CT Chest Wo Contrast    Nov 2021    Standing Status:   Future    Standing Expiration Date:   11/21/2020    Order Specific Question:   ** REASON FOR EXAM (FREE TEXT)    Answer:   74mm RUL nodule in azygoesophageal recess    Order Specific Question:   Preferred imaging location?    Answer:   [facility]    Order Specific Question:   Radiology Contrast Protocol - do NOT remove file path    Answer:   [email or web address]   Stop taking iron supplements.  Keep taking Bevespi two times daily. Rinse after every use.  No orders of the defined types were placed in this encounter.   Return in about 5 months (around 04/23/2020).    Please do your part to reduce the spread of COVID-19.

## 2019-12-04 MED ORDER — HYDROCODONE-ACETAMINOPHEN 5-325 MG PO TABS: 1.0000 | ORAL_TABLET | Freq: Four times a day (QID) | ORAL | 0 refills | Status: DC | PRN

## 2019-12-04 NOTE — Addendum Note (Signed)
Addended by: Brandon Melnick on: 12/04/2019 09:36 AM   Modules accepted: Orders

## 2019-12-04 NOTE — Addendum Note (Signed)
Addended by: Arlice Colt A on: 12/04/2019 11:57 AM   Modules accepted: Orders

## 2019-12-04 NOTE — Telephone Encounter (Signed)
If Dr Jannifer Franklin is not available, can you send this to on call doc. I am not able to approve controlled substances at this time due to an issue with [employer]. Also, when was this last refilled?

## 2019-12-04 NOTE — Telephone Encounter (Signed)
Pt request refill for HYDROcodone-acetaminophen (NORCO/VICODIN) 5-325 MG tablet at [facility].

## 2020-01-03 ENCOUNTER — Other Ambulatory Visit: Payer: Self-pay | Admitting: Family Medicine

## 2020-01-03 MED ORDER — HYDROCODONE-ACETAMINOPHEN 5-325 MG PO TABS: 1.0000 | ORAL_TABLET | Freq: Four times a day (QID) | ORAL | 0 refills | Status: DC | PRN

## 2020-01-03 NOTE — Telephone Encounter (Signed)
Pt is needing a refill on her HYDROcodone-acetaminophen (NORCO/VICODIN) 5-325 MG tablet sent in to the [facility] on [address]

## 2020-01-17 DIAGNOSIS — L720 Epidermal cyst: Secondary | ICD-10-CM | POA: Diagnosis not present

## 2020-01-17 DIAGNOSIS — D485 Neoplasm of uncertain behavior of skin: Secondary | ICD-10-CM | POA: Diagnosis not present

## 2020-01-17 DIAGNOSIS — C44629 Squamous cell carcinoma of skin of left upper limb, including shoulder: Secondary | ICD-10-CM | POA: Diagnosis not present

## 2020-01-17 DIAGNOSIS — L723 Sebaceous cyst: Secondary | ICD-10-CM | POA: Diagnosis not present

## 2020-01-22 ENCOUNTER — Ambulatory Visit
Admission: RE | Admit: 2020-01-22 | Discharge: 2020-01-22 | Disposition: A | Discharge status: Home / Self Care | Payer: Medicare Other | Source: Ambulatory Visit | Attending: Family Medicine | Admitting: Family Medicine

## 2020-01-22 ENCOUNTER — Other Ambulatory Visit: Payer: Self-pay

## 2020-01-22 DIAGNOSIS — M8580 Other specified disorders of bone density and structure, unspecified site: Secondary | ICD-10-CM

## 2020-01-22 DIAGNOSIS — M85852 Other specified disorders of bone density and structure, left thigh: Secondary | ICD-10-CM | POA: Diagnosis not present

## 2020-01-22 DIAGNOSIS — Z780 Asymptomatic menopausal state: Secondary | ICD-10-CM | POA: Diagnosis not present

## 2020-01-30 ENCOUNTER — Other Ambulatory Visit: Payer: Self-pay | Admitting: Family Medicine

## 2020-01-30 MED ORDER — HYDROCODONE-ACETAMINOPHEN 5-325 MG PO TABS: 1.0000 | ORAL_TABLET | Freq: Four times a day (QID) | ORAL | 0 refills | Status: DC | PRN

## 2020-01-30 NOTE — Telephone Encounter (Signed)
Pt request refill HYDROcodone-acetaminophen (NORCO/VICODIN) 5-325 MG tablet at [facility]  

## 2020-02-13 DIAGNOSIS — M7061 Trochanteric bursitis, right hip: Secondary | ICD-10-CM | POA: Diagnosis not present

## 2020-02-19 DIAGNOSIS — Z23 Encounter for immunization: Secondary | ICD-10-CM | POA: Diagnosis not present

## 2020-02-27 ENCOUNTER — Other Ambulatory Visit: Payer: Self-pay | Admitting: Family Medicine

## 2020-02-27 MED ORDER — HYDROCODONE-ACETAMINOPHEN 5-325 MG PO TABS: 1.0000 | ORAL_TABLET | Freq: Four times a day (QID) | ORAL | 0 refills | Status: DC | PRN

## 2020-02-27 NOTE — Telephone Encounter (Signed)
Pt request refill HYDROcodone-acetaminophen (NORCO/VICODIN) 5-325 MG tablet at [facility]

## 2020-03-14 ENCOUNTER — Other Ambulatory Visit: Payer: Self-pay | Admitting: Neurology

## 2020-03-14 DIAGNOSIS — R0600 Dyspnea, unspecified: Secondary | ICD-10-CM

## 2020-03-14 DIAGNOSIS — R0609 Other forms of dyspnea: Secondary | ICD-10-CM

## 2020-03-27 ENCOUNTER — Ambulatory Visit
Admission: RE | Admit: 2020-03-27 | Discharge: 2020-03-27 | Disposition: A | Discharge status: Home / Self Care | Payer: Medicare Other | Source: Ambulatory Visit | Attending: Critical Care Medicine | Admitting: Critical Care Medicine

## 2020-03-27 ENCOUNTER — Telehealth: Payer: Self-pay | Admitting: Critical Care Medicine

## 2020-03-27 ENCOUNTER — Other Ambulatory Visit: Payer: Self-pay | Admitting: Family Medicine

## 2020-03-27 DIAGNOSIS — R918 Other nonspecific abnormal finding of lung field: Secondary | ICD-10-CM

## 2020-03-27 DIAGNOSIS — J449 Chronic obstructive pulmonary disease, unspecified: Secondary | ICD-10-CM

## 2020-03-27 DIAGNOSIS — J984 Other disorders of lung: Secondary | ICD-10-CM | POA: Diagnosis not present

## 2020-03-27 DIAGNOSIS — I2510 Atherosclerotic heart disease of native coronary artery without angina pectoris: Secondary | ICD-10-CM | POA: Diagnosis not present

## 2020-03-27 DIAGNOSIS — J479 Bronchiectasis, uncomplicated: Secondary | ICD-10-CM | POA: Diagnosis not present

## 2020-03-27 DIAGNOSIS — I700 Atherosclerosis of aorta: Secondary | ICD-10-CM | POA: Diagnosis not present

## 2020-03-27 DIAGNOSIS — Z87891 Personal history of nicotine dependence: Secondary | ICD-10-CM

## 2020-03-27 DIAGNOSIS — R911 Solitary pulmonary nodule: Secondary | ICD-10-CM

## 2020-03-27 MED ORDER — HYDROCODONE-ACETAMINOPHEN 5-325 MG PO TABS: 1.0000 | ORAL_TABLET | Freq: Four times a day (QID) | ORAL | 0 refills | Status: DC | PRN

## 2020-03-27 NOTE — Addendum Note (Signed)
Addended by: Brandon Melnick on: 03/27/2020 01:56 PM   Modules accepted: Orders

## 2020-03-27 NOTE — Telephone Encounter (Signed)
Pt. is requesting a refill for HYDROcodone-acetaminophen (NORCO/VICODIN) 5-325 MG tablet.  Pharmacy: [facility] 

## 2020-03-27 NOTE — Telephone Encounter (Signed)
I notified Ms. Janet Giles of her CT scan results. She will require 6 month follow up for new/ expanded GGO in the left base that is of indeterminate etiology.     Being on BEvespi is making a difference in her breathing. Much less SOB. Seldom needing rescue inhaler.   Her brother recently died of complications from surgery for SCC lung cancer.  She is planning on getting flu shot soon. UTD on covid shots.   She will need follow up in about 6 months after her next CT scan is performed (order placed today) since she is doing well from a COPD standpoint. She will follow up with Dr. Erin Fulling in 6 months.  Janet Hy, DO 03/27/20 5:08 PM [facility]

## 2020-03-27 NOTE — Telephone Encounter (Signed)
Next appt 04-17-20 with Dr. Jannifer Franklin.

## 2020-03-28 DIAGNOSIS — Z23 Encounter for immunization: Secondary | ICD-10-CM | POA: Diagnosis not present

## 2020-03-28 NOTE — Progress Notes (Signed)
Office Visit Note  Patient: Janet Giles             Date of Birth: 09/30/1940           MRN: [id number]             PCP: Alroy Dust, L.Marlou Sa, MD Referring: Alroy Dust, Carlean Jews.Marlou Sa, MD Visit Date: 04/10/2020 Occupation: @GUAROCC @  Subjective:  Pain in joint   History of Present Illness: Janet Giles is a 79 y.o. female with history of autoimmune disease and skin lupus.  She had been off Plaquenil since 2018.  She has not had any rash in a long time.  There is no history of oral ulcers, nasal ulcers.  She has some sicca symptoms which is manageable with over-the-counter medications.  She continues to have discomfort in her joints due to arthritis.  She states her trigger finger has been better since she had cortisone injection.  She continues to have some raynaud's phenominon which is manageable.  Activities of Daily Living:  Patient reports morning stiffness for 2 hours.   Patient Reports nocturnal pain.  Difficulty dressing/grooming: Denies Difficulty climbing stairs: Denies Difficulty getting out of chair: Denies Difficulty using hands for taps, buttons, cutlery, and/or writing: Reports  Review of Systems  Constitutional: Positive for fatigue. Negative for night sweats, weight gain and weight loss.  HENT: Positive for mouth dryness. Negative for mouth sores, trouble swallowing, trouble swallowing and nose dryness.   Eyes: Positive for dryness. Negative for pain, redness and visual disturbance.  Respiratory: Positive for shortness of breath. Negative for cough and difficulty breathing.   Cardiovascular: Negative for chest pain, palpitations, hypertension, irregular heartbeat and swelling in legs/feet.  Gastrointestinal: Negative for blood in stool, constipation and diarrhea.  Endocrine: Positive for cold intolerance and increased urination.  Genitourinary: Negative for vaginal dryness.  Musculoskeletal: Positive for arthralgias, joint pain and morning stiffness.  Negative for joint swelling, myalgias, muscle weakness, muscle tenderness and myalgias.  Skin: Positive for color change. Negative for rash, hair loss, skin tightness, ulcers and sensitivity to sunlight.  Allergic/Immunologic: Negative for susceptible to infections.  Neurological: Positive for numbness. Negative for dizziness, memory loss, night sweats and weakness.  Hematological: Negative for bruising/bleeding tendency and swollen glands.  Psychiatric/Behavioral: Negative for depressed mood and sleep disturbance. The patient is not nervous/anxious.     PMFS History:  Patient Active Problem List   Diagnosis Date Noted  . 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 Idiopathic peripheral neuropathy 08/13/2016  . 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 Former smoker 08/13/2016  . ANA positive 08/06/2016  . Raynaud's disease without gangrene 08/06/2016  . Primary osteoarthritis of both hands 08/06/2016  . Primary osteoarthritis of both feet 08/06/2016  . High risk medication use 08/06/2016  . Primary osteoarthritis of right hip 03/24/2015  . 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 AAA (abdominal aortic aneurysm) ([place])   . ANA positive 08/06/2016  . 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 Asthma   . Complication of anesthesia    trouble breathing after back surgery  . 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 Carloyn Manner  . Degenerative joint disease of spine    buttocks pain -remains a problem  . Family history of adverse reaction to anesthesia    one sister nasea and vomiting  . Former smoker 08/13/2016  . GERD (gastroesophageal reflux disease)    occ  . High risk medication use  08/06/2016  . 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 Hyperlipidemia   . Hypothyroidism   . Idiopathic peripheral neuropathy 08/13/2016  . Lupus ([place])    Dr. Aura Fey  . Neuromuscular disorder (HCC)    neuropathy in feet    . PONV (postoperative nausea and vomiting)   . Primary osteoarthritis of both feet 08/06/2016  . Primary osteoarthritis of both hands 08/06/2016  . Primary osteoarthritis of right hip 03/24/2015  . Raynaud's disease without gangrene 08/06/2016  . Shortness of breath dyspnea    uses inhaler occ    Family History  Problem Relation Age of Onset  . Diabetes Mother   . Heart disease Mother   . Heart attack Mother   . Bleeding Disorder Father   . Peptic Ulcer Father   . Heart disease Sister   . Heart attack Sister   . Cancer Sister   . Deep vein thrombosis Sister   . Diabetes Sister   . Hyperlipidemia Sister   . Hypertension Sister   . Varicose Veins Sister   . AAA (abdominal aortic aneurysm) Sister   . Bleeding Disorder Sister   . Breast cancer Sister   . Heart disease Brother   . Heart attack Brother   . Hypertension Brother    Past Surgical History:  Procedure Laterality Date  . ABDOMINAL HYSTERECTOMY    . BACK SURGERY  02   L4-L5- fusion  . BRONCHIAL BRUSHINGS  08/07/2019   Procedure: BRONCHIAL BRUSHINGS;  Surgeon: Julian Hy, DO;  Location: [facility];  Service: Endoscopy;;  . BRONCHIAL WASHINGS  08/07/2019   Procedure: BRONCHIAL WASHINGS;  Surgeon: Julian Hy, DO;  [facility];  Service: Endoscopy;;  . COLONOSCOPY WITH PROPOFOL N/A 03/17/2016   Procedure: COLONOSCOPY WITH PROPOFOL;  Surgeon: Garlan Fair, MD;  [facility];  Service: Endoscopy;  Laterality: N/A;  . ESOPHAGOGASTRODUODENOSCOPY (EGD) WITH PROPOFOL N/A 03/17/2016   Procedure: ESOPHAGOGASTRODUODENOSCOPY (EGD) WITH PROPOFOL;  Surgeon: Garlan Fair, MD;  [facility];  Service: Endoscopy;  Laterality: N/A;  . JOINT REPLACEMENT    . TONSILLECTOMY AND ADENOIDECTOMY    . TOTAL HIP ARTHROPLASTY Right 03/25/2015   Procedure: TOTAL HIP ARTHROPLASTY ANTERIOR APPROACH;  Surgeon: Frederik Pear, MD;  Location: [facility];  Service: Orthopedics;  Laterality: Right;  Marland Kitchen VIDEO  BRONCHOSCOPY N/A 08/07/2019   Procedure: VIDEO BRONCHOSCOPY WITHOUT FLUORO;  Surgeon: Julian Hy, DO;  [facility];  Service: Endoscopy;  Laterality: N/A;   Social History   Social History Narrative   Right handed   2 cups of caffeine daily   Lives at home with husband and granddaughter    Immunization History  Administered Date(s) Administered  . Fluad Quad(high Dose 65+) 02/20/2019  . PFIZER SARS-COV-2 Vaccination 05/24/2019, 06/14/2019, 02/26/2020     Objective: Vital Signs: BP 119/75 (BP Location: Left Arm, Patient Position: Sitting, Cuff Size: Normal)   Pulse 74   Resp 16   Ht 5' 3.5" (1.613 m)   Wt 148 lb 12.8 oz (67.5 kg)   BMI 25.95 kg/m    Physical Exam Vitals and nursing note reviewed.  Constitutional:      Appearance: She is well-developed.  HENT:     Head: Normocephalic and atraumatic.  Eyes:     Conjunctiva/sclera: Conjunctivae normal.  Cardiovascular:     Rate and Rhythm: Normal rate and regular rhythm.     Heart sounds: Normal heart sounds.  Pulmonary:     Effort: Pulmonary effort is normal.  Breath sounds: Normal breath sounds.  Abdominal:     General: Bowel sounds are normal.     Palpations: Abdomen is soft.  Musculoskeletal:     Cervical back: Normal range of motion.  Lymphadenopathy:     Cervical: No cervical adenopathy.  Skin:    General: Skin is warm and dry.     Capillary Refill: Capillary refill takes less than 2 seconds.  Neurological:     Mental Status: She is alert and oriented to person, place, and time.  Psychiatric:        Behavior: Behavior normal.      Musculoskeletal Exam: C-spine was in good range of motion.  She has some discomfort range of motion of her lumbar spine.  Shoulder joints and elbow joints with good range of motion.  She has DIP and PIP thickening in her hands and feet consistent with osteoarthritis.  Right total hip replacement is doing well.  CDAI Exam: CDAI Score: -- Patient Global: --;  Provider Global: -- Swollen: --; Tender: -- Joint Exam 04/10/2020   No joint exam has been documented for this visit   There is currently no information documented on the homunculus. Go to the Rheumatology activity and complete the homunculus joint exam.  Investigation: No additional findings.  Imaging: CT Chest Wo Contrast  Result Date: 03/27/2020 CLINICAL DATA:  Follow-up lung nodule. EXAM: CT CHEST WITHOUT CONTRAST TECHNIQUE: Multidetector CT imaging of the chest was performed following the standard protocol without IV contrast. COMPARISON:  09/25/2019 FINDINGS: Cardiovascular: The heart size appears normal. Aortic atherosclerosis. Coronary artery atherosclerotic calcifications. No pericardial effusion. Mediastinum/Nodes: No enlarged mediastinal or axillary lymph nodes. Thyroid gland, trachea, and esophagus demonstrate no significant findings. Lungs/Pleura: No pleural effusion identified. There is new asymmetric ground-glass attenuation within the left lung base, image 109/8. The previous nodule within the medial aspect of the right upper lobe is stable from previous exam on today's study this appears as a cluster of 3 distinct nodules the largest of which measures 3 mm, image 34/8. The branching nodular density within the posterior right upper lobe measures 1.0 x 0.5 cm. This is compared with 0.8 x 0.4 cm previously. There are surrounding areas of tree-in-bud nodularity as well as dilated distal bronchials with mucoid impaction. Here, a new nodular density measures 5 mm, image 41/8. Focal, dilated distal bronchial with mucoid impaction is identified within the medial aspect of the right middle lobe, image 101/8. Unchanged from previous exam. Upper Abdomen: Mild hepatic steatosis. Aortic atherosclerosis noted. Musculoskeletal: No chest wall mass or suspicious bone lesions identified. IMPRESSION: 1. Stable to slight increase in size of branching nodular density within the posterior right upper lobe  with surrounding areas of tree-in-bud nodularity as well as dilated distal bronchials with mucoid impaction. Findings are favored to represent sequelae of chronic inflammation/infection. 2. Index nodule within the medial right upper lobe appears less solid on today's exam with 3 distinct nodular densities remaining which measure up to 3 mm. 3. There is a new nodule within the posterior right upper lobe in the area of bronchiolectasis and tree-in-bud nodularity. This measures 5 mm and is favored to represent sequelae of inflammation or infection. 4. New asymmetric ground-glass attenuation within the left lung base is identified. This is a nonspecific finding and may be postinflammatory or infectious in etiology. Attention on follow-up imaging is advised. 5. Aortic atherosclerosis and coronary artery atherosclerotic calcifications. 6. Hepatic steatosis. 7. Aortic atherosclerosis. Aortic Atherosclerosis (ICD10-I70.0). Electronically Signed   By: Queen Slough.D.  On: 03/27/2020 16:40    Recent Labs: Lab Results  Component Value Date   WBC 5.5 10/10/2019   HGB 10.3 (L) 10/10/2019   PLT 327 10/10/2019   NA 138 10/10/2019   K 4.8 10/10/2019   CL 105 10/10/2019   CO2 27 10/10/2019   GLUCOSE 75 10/10/2019   BUN 19 10/10/2019   CREATININE 0.83 10/10/2019   BILITOT 0.6 10/10/2019   ALKPHOS 48 07/21/2019   AST 15 10/10/2019   ALT 15 10/10/2019   PROT 6.7 10/10/2019   ALBUMIN 4.2 07/21/2019   CALCIUM 9.7 10/10/2019   GFRAA 78 10/10/2019    Speciality Comments: No specialty comments available.  Procedures:  No procedures performed Allergies: Wasp venom and Doxycycline   Assessment / Plan:     Visit Diagnoses: Autoimmune disease ([place]) - +ANA 1:40, ds DNA -, Raynauds , history of skin lupus biopsy proven by Dr. Ubaldo Glassing per patient.  Patient had no flares of autoimmune disease.  Her labs have been stable as well.  She denies any history of oral ulcers, nasal ulcers, malar rash, photosensitivity or  any rash recently.  High risk medication use - been off of Plaquenil since April 2018.  Raynaud's disease without gangrene-protective clothing and keeping core temperature warm was discussed.  Primary osteoarthritis of both hands-joint protection muscle strengthening was discussed.  Primary osteoarthritis of both feet-proper fitting shoes were discussed.  History of total hip replacement, right - by Dr. Mayer Camel.    DDD (degenerative disc disease), lumbar-she has lower back pain off and on.  Core strengthening was discussed.  Trigger middle finger of right hand-doing better after the cortisone injection.  Other medical problems are listed as follows:  Idiopathic peripheral neuropathy  Aortic ectasia, abdominal (HCC)  Other fatigue  Former smoker  History of COPD  Osteopenia of multiple sites -she had recent bone density which was reviewed.  She has been taking calcium and vitamin D.  DXA January 22, 2020 T score -1.3  Orders: No orders of the defined types were placed in this encounter.  No orders of the defined types were placed in this encounter.   Follow-Up Instructions: Return in about 1 year (around 04/10/2021) for Autoimmune disease, Osteoarthritis.   Bo Merino, MD  Note - This record has been created using Editor, commissioning.  Chart creation errors have been sought, but may not always  have been located. Such creation errors do not reflect on  the standard of medical care.

## 2020-04-10 ENCOUNTER — Other Ambulatory Visit: Payer: Self-pay

## 2020-04-10 ENCOUNTER — Ambulatory Visit (INDEPENDENT_AMBULATORY_CARE_PROVIDER_SITE_OTHER): Payer: Medicare Other | Admitting: Rheumatology

## 2020-04-10 ENCOUNTER — Encounter: Payer: Self-pay | Admitting: Rheumatology

## 2020-04-10 VITALS — BP 119/75 | HR 74 | Resp 16 | Ht 63.5 in | Wt 148.8 lb

## 2020-04-10 DIAGNOSIS — M5136 Other intervertebral disc degeneration, lumbar region: Secondary | ICD-10-CM | POA: Diagnosis not present

## 2020-04-10 DIAGNOSIS — M19071 Primary osteoarthritis, right ankle and foot: Secondary | ICD-10-CM | POA: Diagnosis not present

## 2020-04-10 DIAGNOSIS — Z96641 Presence of right artificial hip joint: Secondary | ICD-10-CM

## 2020-04-10 DIAGNOSIS — R5383 Other fatigue: Secondary | ICD-10-CM

## 2020-04-10 DIAGNOSIS — M359 Systemic involvement of connective tissue, unspecified: Secondary | ICD-10-CM

## 2020-04-10 DIAGNOSIS — G609 Hereditary and idiopathic neuropathy, unspecified: Secondary | ICD-10-CM | POA: Diagnosis not present

## 2020-04-10 DIAGNOSIS — I7300 Raynaud's syndrome without gangrene: Secondary | ICD-10-CM

## 2020-04-10 DIAGNOSIS — I77811 Abdominal aortic ectasia: Secondary | ICD-10-CM

## 2020-04-10 DIAGNOSIS — M19072 Primary osteoarthritis, left ankle and foot: Secondary | ICD-10-CM

## 2020-04-10 DIAGNOSIS — Z87891 Personal history of nicotine dependence: Secondary | ICD-10-CM

## 2020-04-10 DIAGNOSIS — Z79899 Other long term (current) drug therapy: Secondary | ICD-10-CM

## 2020-04-10 DIAGNOSIS — M8589 Other specified disorders of bone density and structure, multiple sites: Secondary | ICD-10-CM

## 2020-04-10 DIAGNOSIS — Z8709 Personal history of other diseases of the respiratory system: Secondary | ICD-10-CM

## 2020-04-10 DIAGNOSIS — M65331 Trigger finger, right middle finger: Secondary | ICD-10-CM | POA: Diagnosis not present

## 2020-04-10 DIAGNOSIS — M19041 Primary osteoarthritis, right hand: Secondary | ICD-10-CM | POA: Diagnosis not present

## 2020-04-10 DIAGNOSIS — M19042 Primary osteoarthritis, left hand: Secondary | ICD-10-CM

## 2020-04-17 ENCOUNTER — Ambulatory Visit (INDEPENDENT_AMBULATORY_CARE_PROVIDER_SITE_OTHER): Payer: Medicare Other | Admitting: Neurology

## 2020-04-17 ENCOUNTER — Encounter: Payer: Self-pay | Admitting: Neurology

## 2020-04-17 VITALS — BP 130/66 | HR 85 | Ht 63.5 in | Wt 147.6 lb

## 2020-04-17 DIAGNOSIS — G609 Hereditary and idiopathic neuropathy, unspecified: Secondary | ICD-10-CM | POA: Diagnosis not present

## 2020-04-17 DIAGNOSIS — L821 Other seborrheic keratosis: Secondary | ICD-10-CM | POA: Diagnosis not present

## 2020-04-17 DIAGNOSIS — Z85828 Personal history of other malignant neoplasm of skin: Secondary | ICD-10-CM | POA: Diagnosis not present

## 2020-04-17 MED ORDER — GABAPENTIN 600 MG PO TABS
600.0000 mg | ORAL_TABLET | Freq: Two times a day (BID) | ORAL | 3 refills | Status: DC
Start: 2020-04-17 — End: 2021-05-13

## 2020-04-17 NOTE — Progress Notes (Signed)
Reason for visit: Peripheral neuropathy  Janet Giles is an 79 y.o. female  History of present illness:  Janet Giles is a 79 year old right-handed white female with a history of a small fiber neuropathy and history of lupus.  The patient is not on any disease modifying agents for lupus.  The patient has discomfort in both feet, she may have some burning in the feet at night, and has to stick the feet out from the covers.  She also has some discomfort in the ball of the foot with weightbearing.  The patient has to wear sandals with open toes as other shoes are uncomfortable.  The patient has not had any recent falls, she does not use a cane for ambulation.  In the past, she could not tolerate Cymbalta.  She remains on gabapentin 600 mg twice daily.  She was to go back on Keppra on the last visit but did not, she was told to get on alpha lipoic acid but did not.  She returns for an evaluation.  Past Medical History:  Diagnosis Date  . AAA (abdominal aortic aneurysm) ([place])   . ANA positive 08/06/2016  . 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 Asthma   . Complication of anesthesia    trouble breathing after back surgery  . 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 Carloyn Manner  . Degenerative joint disease of spine    buttocks pain -remains a problem  . Family history of adverse reaction to anesthesia    one sister nasea and vomiting  . Former smoker 08/13/2016  . GERD (gastroesophageal reflux disease)    occ  . High risk medication use 08/06/2016  . 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 Hyperlipidemia   . Hypothyroidism   . Idiopathic peripheral neuropathy 08/13/2016  . Lupus ([place])    Dr. Aura Fey  . Neuromuscular disorder (HCC)    neuropathy in feet  . PONV (postoperative nausea and vomiting)   . Primary osteoarthritis of both feet 08/06/2016  . Primary osteoarthritis of both hands 08/06/2016  . Primary osteoarthritis of right hip  03/24/2015  . Raynaud's disease without gangrene 08/06/2016  . Shortness of breath dyspnea    uses inhaler occ    Past Surgical History:  Procedure Laterality Date  . ABDOMINAL HYSTERECTOMY    . BACK SURGERY  02   L4-L5- fusion  . BRONCHIAL BRUSHINGS  08/07/2019   Procedure: BRONCHIAL BRUSHINGS;  Surgeon: Julian Hy, DO;  [facility];  Service: Endoscopy;;  . BRONCHIAL WASHINGS  08/07/2019   Procedure: BRONCHIAL WASHINGS;  Surgeon: Julian Hy, DO;  [facility];  Service: Endoscopy;;  . COLONOSCOPY WITH PROPOFOL N/A 03/17/2016   Procedure: COLONOSCOPY WITH PROPOFOL;  Surgeon: Garlan Fair, MD;  [facility];  Service: Endoscopy;  Laterality: N/A;  . ESOPHAGOGASTRODUODENOSCOPY (EGD) WITH PROPOFOL N/A 03/17/2016   Procedure: ESOPHAGOGASTRODUODENOSCOPY (EGD) WITH PROPOFOL;  Surgeon: Garlan Fair, MD;  [facility];  Service: Endoscopy;  Laterality: N/A;  . JOINT REPLACEMENT    . TONSILLECTOMY AND ADENOIDECTOMY    . TOTAL HIP ARTHROPLASTY Right 03/25/2015   Procedure: TOTAL HIP ARTHROPLASTY ANTERIOR APPROACH;  Surgeon: Frederik Pear, MD;  Location: [facility];  Service: Orthopedics;  Laterality: Right;  Marland Kitchen VIDEO BRONCHOSCOPY N/A 08/07/2019   Procedure: VIDEO BRONCHOSCOPY WITHOUT FLUORO;  Surgeon: Julian Hy, DO;  [facility];  Service: Endoscopy;  Laterality: N/A;    Family History  Problem Relation Age  of Onset  . Diabetes Mother   . Heart disease Mother   . Heart attack Mother   . Bleeding Disorder Father   . Peptic Ulcer Father   . Heart disease Sister   . Heart attack Sister   . Cancer Sister   . Deep vein thrombosis Sister   . Diabetes Sister   . Hyperlipidemia Sister   . Hypertension Sister   . Varicose Veins Sister   . AAA (abdominal aortic aneurysm) Sister   . Bleeding Disorder Sister   . Breast cancer Sister   . Heart disease Brother   . Heart attack Brother   . Hypertension Brother     Social history:   reports that she quit smoking about 19 years ago. Her smoking use included cigarettes. She has a 52.50 pack-year smoking history. She has never used smokeless tobacco. She reports that she does not drink alcohol and does not use drugs.    Allergies  Allergen Reactions  . Wasp Venom Shortness Of Breath and Swelling  . Doxycycline Nausea And Vomiting    Medications:  Prior to Admission medications   Medication Sig Start Date End Date Taking? Authorizing Provider  albuterol (PROVENTIL HFA;VENTOLIN HFA) 108 (90 BASE) MCG/ACT inhaler Inhale 1 puff into the lungs every 6 (six) hours as needed for wheezing or shortness of breath.   Yes [provider]  Biotin 5000 MCG CAPS Take 5,000 mcg by mouth every evening.    Yes [provider]  Calcium Carb-Cholecalciferol (CALCIUM + D3 PO) Take 1 tablet by mouth daily.    Yes [provider]  Ferrous Sulfate (IRON SUPPLEMENT PO) Take by mouth.   Yes [provider]  gabapentin (NEURONTIN) 600 MG tablet Take 600 mg by mouth 2 (two) times daily.   Yes [provider]  Glycopyrrolate-Formoterol (BEVESPI AEROSPHERE) 9-4.8 MCG/ACT AERO Inhale 2 puffs into the lungs 2 (two) times daily. 08/29/19  Yes Julian Hy, DO  HYDROcodone-acetaminophen (NORCO/VICODIN) 5-325 MG tablet Take 1 tablet by mouth every 6 (six) hours as needed for moderate pain. Must last 28 days 03/27/20  Yes Lomax, Amy, NP  levothyroxine (SYNTHROID, LEVOTHROID) 75 MCG tablet Take 75 mcg by mouth at bedtime.  05/18/13  Yes [provider]  vitamin B-12 (CYANOCOBALAMIN) 500 MCG tablet Take 500 mcg by mouth every evening.    Yes [provider]  amoxicillin (AMOXIL) 500 MG capsule  01/04/20   [provider]  ferrous gluconate (FERGON) 324 MG tablet Take 1 tablet (324 mg total) by mouth 2 (two) times daily with a meal. 07/21/19   Julian Hy, DO    ROS:  Out of a complete 14 system review of symptoms, the patient complains  only of the following symptoms, and all other reviewed systems are negative.  Foot pain   Blood pressure 130/66, pulse 85, height 5' 3.5" (1.613 m), weight 147 lb 9.6 oz (67 kg).  Physical Exam  General: The patient is alert and cooperative at the time of the examination.  Skin: No significant peripheral edema is noted.   Neurologic Exam  Mental status: The patient is alert and oriented x 3 at the time of the examination. The patient has apparent normal recent and remote memory, with an apparently normal attention span and concentration ability.   Cranial nerves: Facial symmetry is present. Speech is normal, no aphasia or dysarthria is noted. Extraocular movements are full. Visual fields are full.  Motor: The patient has good strength in all 4  extremities, with exception some 4/5 strength with flexion of the hips bilaterally.  Sensory examination: Soft touch sensation is symmetric on the face, arms, and legs.  Coordination: The patient has good finger-nose-finger and heel-to-shin bilaterally.  Gait and station: The patient has a normal gait. Tandem gait is slightly unsteady. Romberg is negative. No drift is seen.  Reflexes: Deep tendon reflexes are symmetric.   Assessment/Plan:  1.  Peripheral neuropathy  The patient likely has some neuropathic pain but also arthritic pain of the feet.  The patient is to get arch inserts for better arch support, she will go on alpha lipoic acid, we may consider increasing the dose of gabapentin in the future to 600 mg 3 times daily.  A prescription for the gabapentin was sent in, she will follow up here in 6 months.  Jill Alexanders MD 04/17/2020 10:04 AM  [facility] [address] [place], [place] [address]  Phone [phone] Fax [phone]

## 2020-04-17 NOTE — Patient Instructions (Signed)
Begin taking alpha lipoic acid, 600 mg a day.

## 2020-04-24 ENCOUNTER — Telehealth: Payer: Self-pay | Admitting: Neurology

## 2020-04-24 MED ORDER — HYDROCODONE-ACETAMINOPHEN 5-325 MG PO TABS: 1.0000 | ORAL_TABLET | Freq: Four times a day (QID) | ORAL | 0 refills | Status: DC | PRN

## 2020-04-24 NOTE — Addendum Note (Signed)
Addended by: Britt Bottom on: 04/24/2020 05:46 PM   Modules accepted: Orders

## 2020-04-24 NOTE — Telephone Encounter (Signed)
Pt. is requesting a refill for HYDROcodone-acetaminophen (NORCO/VICODIN) 5-325 MG tablet.  Pharmacy: [facility] 

## 2020-05-22 ENCOUNTER — Telehealth: Payer: Self-pay | Admitting: Neurology

## 2020-05-22 NOTE — Telephone Encounter (Signed)
Pt. is requesting a refill for HYDROcodone-acetaminophen (NORCO/VICODIN) 5-325 MG tablet.  Pharmacy: [facility] 

## 2020-05-23 MED ORDER — HYDROCODONE-ACETAMINOPHEN 5-325 MG PO TABS: 1.0000 | ORAL_TABLET | Freq: Four times a day (QID) | ORAL | 0 refills | Status: DC | PRN

## 2020-05-23 NOTE — Addendum Note (Signed)
Addended by: Arlice Colt A on: 05/23/2020 10:07 AM   Modules accepted: Orders

## 2020-06-07 DIAGNOSIS — M7061 Trochanteric bursitis, right hip: Secondary | ICD-10-CM | POA: Diagnosis not present

## 2020-06-18 DIAGNOSIS — M7061 Trochanteric bursitis, right hip: Secondary | ICD-10-CM | POA: Diagnosis not present

## 2020-06-18 NOTE — Progress Notes (Addendum)
CARDIOLOGY CONSULT NOTE       Patient ID: Janet Giles MRN: [id number] DOB/AGE: [birth year] 80 y.o.  Virtual Visit via Video Note   This visit type was conducted due to national recommendations for restrictions regarding the COVID-19 Pandemic (e.g. social distancing) in an effort to limit this patient's exposure and mitigate transmission in our community.  Due to her co-morbid illnesses, this patient is at least at moderate risk for complications without adequate follow up.  This format is felt to be most appropriate for this patient at this time.  All issues noted in this document were discussed and addressed.  A limited physical exam was performed with this format.  Please refer to the patient's chart for her consent to telehealth for [facility].   Patient location: Home Physician location: Office   HPI:  80 y.o. f/u for aortic valve disease Last echo done 05/08/19 with tri leaflet AV mean gradient Only 7 mmHg normal EF and mild MR She has also had serial abdominal US for AAA but most recent 04/27/19 only 2.7 cm unchanged from 2018 She has history of hypothyroidism As well as asthma and neuropathy in her feet takes Gabapentin and sees Dr Jannifer Franklin in neurology   She has had exertional dyspnea worse last few months. Diagnosed with asthma Has not had PFTls in years No history of DVT/ PE or connective tissue disease   CT done 03/27/20 sowed RUL bronchiectasis with tree bud nodularity along with ground glass attenuation in left lung base Seen by [facility] Dr Carlis Abbott 11/22/19 improved on Bevespi and using Albuterol less She had bronchoscopy 08/07/19 with negative cultures  Dr Carlis Abbott felt a lot of the changes from mucoid impaction rather than active infection   Biggest issue is peripheral neuropathy in feet  Has had vaccine   ROS All other systems reviewed and negative except as noted above  Past Medical History:  Diagnosis Date  . AAA (abdominal aortic aneurysm) ([place])   .  ANA positive 08/06/2016  . 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 Asthma   . Complication of anesthesia    trouble breathing after back surgery  . 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 Carloyn Manner  . Degenerative joint disease of spine    buttocks pain -remains a problem  . Family history of adverse reaction to anesthesia    one sister nasea and vomiting  . Former smoker 08/13/2016  . GERD (gastroesophageal reflux disease)    occ  . High risk medication use 08/06/2016  . 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 Hyperlipidemia   . Hypothyroidism   . Idiopathic peripheral neuropathy 08/13/2016  . Lupus ([place])    Dr. Aura Fey  . Neuromuscular disorder (H[facility])    neuropathy in feet  . PONV (postoperative nausea and vomiting)   . Primary osteoarthritis of both feet 08/06/2016  . Primary osteoarthritis of both hands 08/06/2016  . Primary osteoarthritis of right hip 03/24/2015  . Raynaud's disease without gangrene 08/06/2016  . Shortness of breath dyspnea    uses inhaler occ    Family History  Problem Relation Age of Onset  . Diabetes Mother   . Heart disease Mother   . Heart attack Mother   . Bleeding Disorder Father   . Peptic Ulcer Father   . Heart disease Sister   . Heart attack Sister   . Cancer Sister   . Deep vein thrombosis Sister   . Diabetes Sister   . Hyperlipidemia  Sister   . Hypertension Sister   . Varicose Veins Sister   . AAA (abdominal aortic aneurysm) Sister   . Bleeding Disorder Sister   . Breast cancer Sister   . Heart disease Brother   . Heart attack Brother   . Hypertension Brother     Social History   Socioeconomic History  . Marital status: Married    Spouse name: Not on file  . Number of children: 3  . Years of education: Not on file  . Highest education level: Some college, no degree  Occupational History  . Occupation: Retired  Tobacco Use  . Smoking status: Former Smoker    Packs/day: 1.50    Years: 35.00     Pack years: 52.50    Types: Cigarettes    Quit date: 05/11/2000    Years since quitting: 20.1  . Smokeless tobacco: Never Used  Vaping Use  . Vaping Use: Never used  Substance and Sexual Activity  . Alcohol use: Never    Alcohol/week: 0.0 standard drinks  . Drug use: Never  . Sexual activity: Not on file  Other Topics Concern  . Not on file  Social History Narrative   Right handed   2 cups of caffeine daily   Lives at home with husband   Social Determinants of Health   Financial Resource Strain: Not on file  Food Insecurity: Not on file  Transportation Needs: Not on file  Physical Activity: Not on file  Stress: Not on file  Social Connections: Not on file  Intimate Partner Violence: Not on file    Past Surgical History:  Procedure Laterality Date  . ABDOMINAL HYSTERECTOMY    . BACK SURGERY  02   L4-L5- fusion  . BRONCHIAL BRUSHINGS  08/07/2019   Procedure: BRONCHIAL BRUSHINGS;  Surgeon: Julian Hy, DO;  Location: [facility];  Service: Endoscopy;;  . BRONCHIAL WASHINGS  08/07/2019   Procedure: BRONCHIAL WASHINGS;  Surgeon: Julian Hy, DO;  Location: [facility];  Service: Endoscopy;;  . COLONOSCOPY WITH PROPOFOL N/A 03/17/2016   Procedure: COLONOSCOPY WITH PROPOFOL;  Surgeon: Garlan Fair, MD;  Location: [facility];  Service: Endoscopy;  Laterality: N/A;  . ESOPHAGOGASTRODUODENOSCOPY (EGD) WITH PROPOFOL N/A 03/17/2016   Procedure: ESOPHAGOGASTRODUODENOSCOPY (EGD) WITH PROPOFOL;  Surgeon: Garlan Fair, MD;  Location: [facility];  Service: Endoscopy;  Laterality: N/A;  . JOINT REPLACEMENT    . TONSILLECTOMY AND ADENOIDECTOMY    . TOTAL HIP ARTHROPLASTY Right 03/25/2015   Procedure: TOTAL HIP ARTHROPLASTY ANTERIOR APPROACH;  Surgeon: Frederik Pear, MD;  Location: [facility];  Service: Orthopedics;  Laterality: Right;  Marland Kitchen VIDEO BRONCHOSCOPY N/A 08/07/2019   Procedure: VIDEO BRONCHOSCOPY WITHOUT FLUORO;  Surgeon: Julian Hy, DO;  Location: [facility];  Service:  Endoscopy;  Laterality: N/A;      Current Outpatient Medications:  .  albuterol (PROVENTIL HFA;VENTOLIN HFA) 108 (90 BASE) MCG/ACT inhaler, Inhale 1 puff into the lungs every 6 (six) hours as needed for wheezing or shortness of breath., Disp: , Rfl:  .  amoxicillin (AMOXIL) 500 MG capsule, , Disp: , Rfl:  .  Biotin 5000 MCG CAPS, Take 5,000 mcg by mouth every evening. , Disp: , Rfl:  .  Calcium Carb-Cholecalciferol (CALCIUM + D3 PO), Take 1 tablet by mouth daily. , Disp: , Rfl:  .  ferrous gluconate (FERGON) 324 MG tablet, Take 1 tablet (324 mg total) by mouth 2 (two) times daily with a meal. (Patient taking differently: Take 324 mg by mouth  daily with breakfast.), Disp: 60 tablet, Rfl: 3 .  gabapentin (NEURONTIN) 600 MG tablet, Take 1 tablet (600 mg total) by mouth 2 (two) times daily., Disp: 180 tablet, Rfl: 3 .  Glycopyrrolate-Formoterol (BEVESPI AEROSPHERE) 9-4.8 MCG/ACT AERO, Inhale 2 puffs into the lungs 2 (two) times daily., Disp: 10.7 g, Rfl: 11 .  HYDROcodone-acetaminophen (NORCO/VICODIN) 5-325 MG tablet, Take 1 tablet by mouth every 6 (six) hours as needed for moderate pain. Must last 28 days, Disp: 60 tablet, Rfl: 0 .  levothyroxine (SYNTHROID, LEVOTHROID) 75 MCG tablet, Take 75 mcg by mouth at bedtime., Disp: , Rfl:  .  vitamin B-12 (CYANOCOBALAMIN) 500 MCG tablet, Take 500 mcg by mouth every evening. , Disp: , Rfl:     Physical Exam: Blood pressure 104/62, pulse (!) 112, height 5\' 3"  (1.6 m), weight 66.2 kg.    No distress No tachypnea No edema No JVP elevation    Labs:   Lab Results  Component Value Date   WBC 5.5 10/10/2019   HGB 10.3 (L) 10/10/2019   HCT 31.2 (L) 10/10/2019   MCV 94.8 10/10/2019   PLT 327 10/10/2019      Radiology: ECHOCARDIOGRAM COMPLETE  Result Date: 06/25/2020    ECHOCARDIOGRAM REPORT   Patient Name:   JAMILAH JEAN Date of Exam: 06/25/2020 Medical Rec #:  [id number]                Height:       63.5 in Accession #:    [id number]                Weight:       147.6 lb Date of Birth:  26-Mar-1941                 BSA:          1.709 m Patient Age:    47 years                 BP:           130/72 mmHg Patient Gender: F                        HR:           69 bpm. Exam Location:  [place] Procedure: 2D Echo, Cardiac Doppler and Color Doppler Indications:    I35.0 Aortic Stenosis  History:        Patient has prior history of Echocardiogram examinations, most                 recent 05/08/2019. COPD and AAA, Signs/Symptoms:Dyspnea; Risk                 Factors:Former Smoker and HLD.  Sonographer:    Marygrace Drought RCS Referring Phys: [place]  1. Left ventricular ejection fraction, by estimation, is 55 to 60%. The left ventricle has normal function. The left ventricle has no regional wall motion abnormalities. Left ventricular diastolic parameters are consistent with Grade I diastolic dysfunction (impaired relaxation).  2. Right ventricular systolic function is normal. The right ventricular size is normal.  3. The mitral valve is normal in structure. Trivial mitral valve regurgitation.  4. The aortic valve is tricuspid. There is moderate calcification of the aortic valve. Aortic valve regurgitation is not visualized. Mild aortic valve stenosis. FINDINGS  Left Ventricle: Left ventricular ejection fraction, by estimation, is 55 to 60%. The left ventricle has normal function. The left  ventricle has no regional wall motion abnormalities. The left ventricular internal cavity size was normal in size. There is  no left ventricular hypertrophy. Left ventricular diastolic parameters are consistent with Grade I diastolic dysfunction (impaired relaxation). Right Ventricle: The right ventricular size is normal. No increase in right ventricular wall thickness. Right ventricular systolic function is normal. Left Atrium: Left atrial size was normal in size. Right Atrium: Right atrial size was normal in size. Pericardium: There is no evidence  of pericardial effusion. Mitral Valve: The mitral valve is normal in structure. Trivial mitral valve regurgitation. Tricuspid Valve: The tricuspid valve is normal in structure. Tricuspid valve regurgitation is mild. Aortic Valve: The aortic valve is tricuspid. There is moderate calcification of the aortic valve. Aortic valve regurgitation is not visualized. Mild aortic stenosis is present. Aortic valve mean gradient measures 7.7 mmHg. Aortic valve peak gradient measures 12.4 mmHg. Aortic valve area, by VTI measures 1.07 cm. Pulmonic Valve: The pulmonic valve was normal in structure. Pulmonic valve regurgitation is not visualized. Aorta: The aortic root and ascending aorta are structurally normal, with no evidence of dilitation. IAS/Shunts: The atrial septum is grossly normal.  LEFT VENTRICLE PLAX 2D LVIDd:         4.00 cm LVIDs:         2.60 cm LV PW:         1.00 cm LV IVS:        1.00 cm LVOT diam:     1.80 cm  3D Volume EF: LV SV:         41       3D EF:        54 % LV SV Index:   24       LV EDV:       83 ml LVOT Area:     2.54 cm LV ESV:       39 ml                         LV SV:        45 ml RIGHT VENTRICLE RV Basal diam:  3.10 cm RV S prime:     11.60 cm/s TAPSE (M-mode): 2.0 cm LEFT ATRIUM             Index       RIGHT ATRIUM           Index LA diam:        3.20 cm 1.87 cm/m  RA Area:     10.30 cm LA Vol (A2C):   30.6 ml 17.90 ml/m RA Volume:   19.70 ml  11.52 ml/m LA Vol (A4C):   14.1 ml 8.25 ml/m LA Biplane Vol: 23.9 ml 13.98 ml/m  AORTIC VALVE AV Area (Vmax):    1.08 cm AV Area (Vmean):   1.05 cm AV Area (VTI):     1.07 cm AV Vmax:           176.33 cm/s AV Vmean:          134.000 cm/s AV VTI:            0.387 m AV Peak Grad:      12.4 mmHg AV Mean Grad:      7.7 mmHg LVOT Vmax:         75.10 cm/s LVOT Vmean:        55.200 cm/s LVOT VTI:          0.163 m LVOT/AV VTI  ratio: 0.42  AORTA Ao Root diam: 3.10 cm Ao Asc diam:  3.00 cm MITRAL VALVE               TRICUSPID VALVE MV Area (PHT):              TR Peak grad:   18.3 mmHg MV Decel Time:             TR Vmax:        214.00 cm/s MV E velocity: 53.10 cm/s MV A velocity: 96.80 cm/s  SHUNTS MV E/A ratio:  0.55        Systemic VTI:  0.16 m                            Systemic Diam: 1.80 cm Mertie Moores MD Electronically signed by Mertie Moores MD Signature Date/Time: 06/25/2020/1:52:04 PM    Final     EKG: SR rate 63 normal ECG 03/13/15 06/28/19 SR rate 80 normal    ASSESSMENT AND PLAN:   1. Aortic Stenosis:  Mild by echo 06/25/20 mean gradient 7 mmHg observe  2. Neuropathy:  Continue gabapentin f/u Dr Jannifer Franklin 3. AAA: not clear this needs yearly [place] f/u has diameter only 2.7 cm since 2018 f/u primary  4. Hypothyroidism :  On replacement labs with primary  5. Lipids:  F/u primary target LDL 70 or less 6. Dyspnea:  Not related to heart f/u [facility] for bronchiectasis and chronic mucoid compaction Continue Bevespi and Albuterol as well as CT surveillance  F/U in a year with echo for AS   Time: spent reviewing echo, CT;s pulmonary notes direct patient interview and composing note 20 minutes    Signed: Jenkins Rouge 06/27/2020, 8:30 AM

## 2020-06-20 ENCOUNTER — Other Ambulatory Visit: Payer: Self-pay | Admitting: Neurology

## 2020-06-20 ENCOUNTER — Other Ambulatory Visit: Payer: Self-pay | Admitting: Emergency Medicine

## 2020-06-20 MED ORDER — HYDROCODONE-ACETAMINOPHEN 5-325 MG PO TABS: 1.0000 | ORAL_TABLET | Freq: Four times a day (QID) | ORAL | 0 refills | Status: DC | PRN

## 2020-06-20 NOTE — Addendum Note (Signed)
Addended by: Rhae Lerner R on: 06/20/2020 11:33 AM   Modules accepted: Orders

## 2020-06-20 NOTE — Telephone Encounter (Signed)
Pt. is requesting a refill for HYDROcodone-acetaminophen (NORCO/VICODIN) 5-325 MG tablet.  Pharmacy: [facility] [phone]

## 2020-06-25 ENCOUNTER — Ambulatory Visit (HOSPITAL_COMMUNITY): Payer: Medicare Other | Attending: Cardiovascular Disease

## 2020-06-25 ENCOUNTER — Other Ambulatory Visit: Payer: Self-pay

## 2020-06-25 DIAGNOSIS — I350 Nonrheumatic aortic (valve) stenosis: Secondary | ICD-10-CM

## 2020-06-25 DIAGNOSIS — R0600 Dyspnea, unspecified: Secondary | ICD-10-CM

## 2020-06-25 DIAGNOSIS — R0609 Other forms of dyspnea: Secondary | ICD-10-CM

## 2020-06-25 LAB — ECHOCARDIOGRAM COMPLETE
AR max vel: 1.08 cm2
AV Area VTI: 1.07 cm2
AV Area mean vel: 1.05 cm2
AV Mean grad: 7.7 mmHg
AV Peak grad: 12.4 mmHg
Ao pk vel: 1.76 m/s
Area-P 1/2: 3.56 cm2
S' Lateral: 2.6 cm

## 2020-06-27 ENCOUNTER — Other Ambulatory Visit: Payer: Self-pay

## 2020-06-27 ENCOUNTER — Telehealth (INDEPENDENT_AMBULATORY_CARE_PROVIDER_SITE_OTHER): Payer: Medicare Other | Admitting: Cardiovascular Disease

## 2020-06-27 VITALS — BP 104/62 | HR 112 | Ht 63.0 in | Wt 146.0 lb

## 2020-06-27 DIAGNOSIS — I350 Nonrheumatic aortic (valve) stenosis: Secondary | ICD-10-CM | POA: Diagnosis not present

## 2020-06-27 NOTE — Patient Instructions (Signed)
Medication Instructions:  *If you need a refill on your cardiac medications before your next appointment, please call your pharmacy*   Lab Work: If you have labs (blood work) drawn today and your tests are completely normal, you will receive your results only by: Marland Kitchen MyChart Message (if you have MyChart) OR . A paper copy in the mail If you have any lab test that is abnormal or we need to change your treatment, we will call you to review the results.  Testing/Procedures: Your physician has requested that you have an echocardiogram one year at office visit. Echocardiography is a painless test that uses sound waves to create images of your heart. It provides your doctor with information about the size and shape of your heart and how well your heart's chambers and valves are working. This procedure takes approximately one hour. There are no restrictions for this procedure.  Follow-Up: At [facility], you and your health needs are our priority.  As part of our continuing mission to provide you with exceptional heart care, we have created designated Provider Care Teams.  These Care Teams include your primary Cardiologist (physician) and Advanced Practice Providers (APPs -  Physician Assistants and Nurse Practitioners) who all work together to provide you with the care you need, when you need it.  We recommend signing up for the patient portal called "MyChart".  Sign up information is provided on this After Visit Summary.  MyChart is used to connect with patients for Virtual Visits (Telemedicine).  Patients are able to view lab/test results, encounter notes, upcoming appointments, etc.  Non-urgent messages can be sent to your provider as well.   To learn more about what you can do with MyChart, go to [email or web address].    Your next appointment:   1 year(s)  The format for your next appointment:   In Person  Provider:   You may see Dr. Johnsie Cancel or one of the following Advanced Practice  Providers on your designated Care Team:    Kathyrn Drown, NP

## 2020-07-16 ENCOUNTER — Other Ambulatory Visit: Payer: Self-pay | Admitting: Orthopedic Surgery

## 2020-07-16 ENCOUNTER — Other Ambulatory Visit (HOSPITAL_COMMUNITY): Payer: Self-pay | Admitting: Orthopedic Surgery

## 2020-07-16 ENCOUNTER — Other Ambulatory Visit: Payer: Self-pay

## 2020-07-16 DIAGNOSIS — M25551 Pain in right hip: Secondary | ICD-10-CM

## 2020-07-16 DIAGNOSIS — Z1231 Encounter for screening mammogram for malignant neoplasm of breast: Secondary | ICD-10-CM

## 2020-07-23 ENCOUNTER — Other Ambulatory Visit: Payer: Self-pay | Admitting: Neurology

## 2020-07-23 MED ORDER — HYDROCODONE-ACETAMINOPHEN 5-325 MG PO TABS: 1.0000 | ORAL_TABLET | Freq: Four times a day (QID) | ORAL | 0 refills | Status: DC | PRN

## 2020-07-23 NOTE — Telephone Encounter (Signed)
Pt. is requesting a refill for HYDROcodone-acetaminophen (NORCO/VICODIN) 5-325 MG tablet.  Pharmacy: [facility] [phone]

## 2020-07-23 NOTE — Addendum Note (Signed)
Addended by: Wyvonnia Lora on: 07/23/2020 02:41 PM   Modules accepted: Orders

## 2020-07-25 ENCOUNTER — Encounter (HOSPITAL_COMMUNITY)
Admission: RE | Admit: 2020-07-25 | Discharge: 2020-07-25 | Disposition: A | Discharge status: Home / Self Care | Payer: Medicare Other | Source: Ambulatory Visit | Attending: Orthopedic Surgery | Admitting: Orthopedic Surgery

## 2020-07-25 ENCOUNTER — Other Ambulatory Visit: Payer: Self-pay

## 2020-07-25 DIAGNOSIS — M4326 Fusion of spine, lumbar region: Secondary | ICD-10-CM | POA: Diagnosis not present

## 2020-07-25 DIAGNOSIS — Z981 Arthrodesis status: Secondary | ICD-10-CM | POA: Diagnosis not present

## 2020-07-25 DIAGNOSIS — M25551 Pain in right hip: Secondary | ICD-10-CM | POA: Insufficient documentation

## 2020-07-25 MED ORDER — TECHNETIUM TC 99M MEDRONATE IV KIT
20.0000 | PACK | Freq: Once | INTRAVENOUS | Status: AC | PRN
  Administered 2020-07-25: 21.8 via INTRAVENOUS

## 2020-07-29 DIAGNOSIS — R300 Dysuria: Secondary | ICD-10-CM | POA: Diagnosis not present

## 2020-08-06 DIAGNOSIS — M25551 Pain in right hip: Secondary | ICD-10-CM | POA: Diagnosis not present

## 2020-08-20 ENCOUNTER — Other Ambulatory Visit: Payer: Self-pay | Admitting: Neurology

## 2020-08-20 NOTE — Telephone Encounter (Signed)
Pt is needing a refill on her HYDROcodone-acetaminophen (NORCO/VICODIN) 5-325 MG tablet sent in to the [facility] on [address]

## 2020-08-21 MED ORDER — HYDROCODONE-ACETAMINOPHEN 5-325 MG PO TABS: 1.0000 | ORAL_TABLET | Freq: Four times a day (QID) | ORAL | 0 refills | Status: DC | PRN

## 2020-09-10 ENCOUNTER — Other Ambulatory Visit: Payer: Self-pay

## 2020-09-10 ENCOUNTER — Ambulatory Visit
Admission: RE | Admit: 2020-09-10 | Discharge: 2020-09-10 | Disposition: A | Discharge status: Home / Self Care | Payer: Medicare Other | Source: Ambulatory Visit | Attending: Family Medicine | Admitting: Family Medicine

## 2020-09-10 DIAGNOSIS — Z1231 Encounter for screening mammogram for malignant neoplasm of breast: Secondary | ICD-10-CM | POA: Diagnosis not present

## 2020-09-12 DIAGNOSIS — D3132 Benign neoplasm of left choroid: Secondary | ICD-10-CM | POA: Diagnosis not present

## 2020-09-18 ENCOUNTER — Telehealth: Payer: Self-pay | Admitting: Neurology

## 2020-09-18 MED ORDER — HYDROCODONE-ACETAMINOPHEN 5-325 MG PO TABS: 1.0000 | ORAL_TABLET | Freq: Four times a day (QID) | ORAL | 0 refills | Status: DC | PRN

## 2020-09-18 NOTE — Telephone Encounter (Signed)
The hydrocodone prescription was sent in.

## 2020-09-18 NOTE — Telephone Encounter (Signed)
Pt request refill HYDROcodone-acetaminophen (NORCO/VICODIN) 5-325 MG tablet at [facility]  

## 2020-09-23 ENCOUNTER — Other Ambulatory Visit: Payer: Self-pay

## 2020-09-23 ENCOUNTER — Ambulatory Visit (HOSPITAL_COMMUNITY)
Admission: RE | Admit: 2020-09-23 | Discharge: 2020-09-23 | Disposition: A | Discharge status: Home / Self Care | Payer: Medicare Other | Source: Ambulatory Visit | Attending: Critical Care Medicine | Admitting: Critical Care Medicine

## 2020-09-23 DIAGNOSIS — R911 Solitary pulmonary nodule: Secondary | ICD-10-CM | POA: Insufficient documentation

## 2020-09-23 DIAGNOSIS — I2510 Atherosclerotic heart disease of native coronary artery without angina pectoris: Secondary | ICD-10-CM | POA: Diagnosis not present

## 2020-09-23 DIAGNOSIS — I700 Atherosclerosis of aorta: Secondary | ICD-10-CM | POA: Diagnosis not present

## 2020-09-23 DIAGNOSIS — I708 Atherosclerosis of other arteries: Secondary | ICD-10-CM | POA: Diagnosis not present

## 2020-09-26 ENCOUNTER — Telehealth: Payer: Self-pay

## 2020-09-26 NOTE — Telephone Encounter (Signed)
-----   Message from Julian Hy, DO sent at 09/26/2020  1:21 PM EDT ----- Regarding: needs appointment scheduled This is my former patient who was supposed to establish with a new provider around now. This needs to be a 30 min visit. Thanks!  LPC

## 2020-09-26 NOTE — Progress Notes (Signed)
Please let her know that her CT scan is stable.  I sent a message to triage, but she needs to establish with a new provider as well. Thanks!

## 2020-09-26 NOTE — Telephone Encounter (Signed)
ATC patient to get her set up with new provider, LMTCB  Patient has history of bronchiectasis when she calls back please schedule her with Dr. Silas Flood if possible 30 min new patient slot

## 2020-09-30 NOTE — Telephone Encounter (Signed)
Called and spoke with patient she is now set up to see Dr. Erin Fulling. Nothing further needed. Routing to Dr. Carlis Abbott as FYI ONLY.  Next Appt With Pulmonology Freddi Starr, MD) 10/22/2020 at 11:30 AM

## 2020-10-01 DIAGNOSIS — H35341 Macular cyst, hole, or pseudohole, right eye: Secondary | ICD-10-CM | POA: Diagnosis not present

## 2020-10-01 DIAGNOSIS — Z961 Presence of intraocular lens: Secondary | ICD-10-CM | POA: Diagnosis not present

## 2020-10-01 DIAGNOSIS — H2511 Age-related nuclear cataract, right eye: Secondary | ICD-10-CM | POA: Diagnosis not present

## 2020-10-01 DIAGNOSIS — H26492 Other secondary cataract, left eye: Secondary | ICD-10-CM | POA: Diagnosis not present

## 2020-10-04 ENCOUNTER — Telehealth: Payer: Self-pay | Admitting: Pulmonary Disease

## 2020-10-04 DIAGNOSIS — M62838 Other muscle spasm: Secondary | ICD-10-CM | POA: Diagnosis not present

## 2020-10-04 MED ORDER — BEVESPI AEROSPHERE 9-4.8 MCG/ACT IN AERO: 2.0000 | INHALATION_SPRAY | Freq: Two times a day (BID) | RESPIRATORY_TRACT | 11 refills | Status: DC

## 2020-10-04 NOTE — Telephone Encounter (Signed)
Pt is on her last inhaler Bevespi. Pharmacy states that she will need a new presciption in order to get a refill. Former pt of Dr.Clark and has not yet seen Dr.Dewald, though she does have an appt scheduled for June. Pt verbalized that we may leave a message if she does not answer.

## 2020-10-04 NOTE — Telephone Encounter (Signed)
Patient scheduled 10/22/20 with Dr. Erin Fulling.  Breztri prescription sent to [employer] for continued Patient assistance.  Nothing further at this time.

## 2020-10-04 NOTE — Telephone Encounter (Signed)
Spoke to patient, who is requesting Bevespi refill to be faxed to [employer].  Former Dr. Carlis Abbott patient scheduled to see Dr. Erin Fulling in June.   Triage, please print Rx and have MD sign, Thanks.

## 2020-10-10 ENCOUNTER — Other Ambulatory Visit: Payer: Self-pay

## 2020-10-10 MED ORDER — BEVESPI AEROSPHERE 9-4.8 MCG/ACT IN AERO: 2.0000 | INHALATION_SPRAY | Freq: Two times a day (BID) | RESPIRATORY_TRACT | 11 refills | Status: DC

## 2020-10-15 DIAGNOSIS — R109 Unspecified abdominal pain: Secondary | ICD-10-CM | POA: Diagnosis not present

## 2020-10-15 DIAGNOSIS — D649 Anemia, unspecified: Secondary | ICD-10-CM | POA: Diagnosis not present

## 2020-10-15 DIAGNOSIS — J45909 Unspecified asthma, uncomplicated: Secondary | ICD-10-CM | POA: Diagnosis not present

## 2020-10-15 DIAGNOSIS — E7800 Pure hypercholesterolemia, unspecified: Secondary | ICD-10-CM | POA: Diagnosis not present

## 2020-10-15 DIAGNOSIS — M8580 Other specified disorders of bone density and structure, unspecified site: Secondary | ICD-10-CM | POA: Diagnosis not present

## 2020-10-15 DIAGNOSIS — G629 Polyneuropathy, unspecified: Secondary | ICD-10-CM | POA: Diagnosis not present

## 2020-10-15 DIAGNOSIS — E039 Hypothyroidism, unspecified: Secondary | ICD-10-CM | POA: Diagnosis not present

## 2020-10-16 ENCOUNTER — Other Ambulatory Visit: Payer: Self-pay | Admitting: Neurology

## 2020-10-16 NOTE — Telephone Encounter (Signed)
Pt is needing a refill on her HYDROcodone-acetaminophen (NORCO/VICODIN) 5-325 MG tablet sent in to the [facility] on [address]

## 2020-10-16 NOTE — Progress Notes (Signed)
PATIENT: Janet Giles DOB: 09/09/1940  REASON FOR VISIT: follow up HISTORY FROM: patient Primary Neurologist: Dr. Jannifer [place]   Today 10/17/20 Janet Giles is an 80 year old female with history of small fiber neuropathy and lupus.  On gabapentin.  Stopped alpha lipoic acid after she felt not helpful for several months.  Continues to report burning to her feet, has to wear thick socks, sandals.  Takes hydrocodone twice daily.  Pain is stable.  Mentions a new problem for the last 4 days, nerve type pain to her right lower abdomen from umbilicus to mid right flank Light red rash, no lesions, circular.  Denies diarrhea or blood in stool.  Pain feels okay during the day, at night.  Is painful, very tender to any touch. Saw PCP, labs pending.  HISTORY  04/17/2020 Dr. Jannifer [place]: Janet Giles is a 80 year old right-handed white female with a history of a small fiber neuropathy and history of lupus.  The patient is not on any disease modifying agents for lupus.  The patient has discomfort in both feet, she may have some burning in the feet at night, and has to stick the feet out from the covers.  She also has some discomfort in the ball of the foot with weightbearing.  The patient has to wear sandals with open toes as other shoes are uncomfortable.  The patient has not had any recent falls, she does not use a cane for ambulation.  In the past, she could not tolerate Cymbalta.  She remains on gabapentin 600 mg twice daily.  She was to go back on Keppra on the last visit but did not, she was told to get on alpha lipoic acid but did not.  She returns for an evaluation.  REVIEW OF SYSTEMS: Out of a complete 14 system review of symptoms, the patient complains only of the following symptoms, and all other reviewed systems are negative.  See HPI  ALLERGIES: Allergies  Allergen Reactions   Wasp Venom Shortness Of Breath and Swelling   Doxycycline Nausea And Vomiting    HOME MEDICATIONS: Outpatient  Medications Prior to Visit  Medication Sig Dispense Refill   albuterol (PROVENTIL HFA;VENTOLIN HFA) 108 (90 BASE) MCG/ACT inhaler Inhale 1 puff into the lungs every 6 (six) hours as needed for wheezing or shortness of breath.     amoxicillin (AMOXIL) 500 MG capsule      Biotin 5000 MCG CAPS Take 5,000 mcg by mouth every evening.      Calcium Carb-Cholecalciferol (CALCIUM + D3 PO) Take 1 tablet by mouth daily.      ferrous gluconate (FERGON) 324 MG tablet Take 1 tablet (324 mg total) by mouth 2 (two) times daily with a meal. (Patient taking differently: Take 324 mg by mouth daily with breakfast.) 60 tablet 3   gabapentin (NEURONTIN) 600 MG tablet Take 1 tablet (600 mg total) by mouth 2 (two) times daily. 180 tablet 3   Glycopyrrolate-Formoterol (BEVESPI AEROSPHERE) 9-4.8 MCG/ACT AERO Inhale 2 puffs into the lungs 2 (two) times daily. 10.7 g 11   HYDROcodone-acetaminophen (NORCO/VICODIN) 5-325 MG tablet Take 1 tablet by mouth every 6 (six) hours as needed for moderate pain. Must last 28 days 60 tablet 0   levothyroxine (SYNTHROID, LEVOTHROID) 75 MCG tablet Take 75 mcg by mouth at bedtime.     vitamin B-12 (CYANOCOBALAMIN) 500 MCG tablet Take 500 mcg by mouth every evening.      No facility-administered medications prior to visit.    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 Aura Fey   Neuromuscular disorder ([place])    neuropathy in feet   PONV (postoperative nausea and vomiting)    Primary osteoarthritis of both feet 08/06/2016   Primary osteoarthritis of both hands 08/06/2016   Primary osteoarthritis of right hip 03/24/2015   Raynaud's disease without gangrene 08/06/2016   Shortness of breath dyspnea    uses inhaler occ    PAST SURGICAL HISTORY: Past Surgical History:  Procedure Laterality Date   ABDOMINAL HYSTERECTOMY     BACK SURGERY  02   L4-L5- fusion   BRONCHIAL BRUSHINGS  08/07/2019   Procedure: BRONCHIAL BRUSHINGS;  Surgeon: Julian Hy, DO;  Location: [facility];  Service: Endoscopy;;   BRONCHIAL WASHINGS  08/07/2019   Procedure: BRONCHIAL WASHINGS;  Surgeon: Julian Hy, DO;  Location: [facility];  Service: Endoscopy;;   COLONOSCOPY WITH PROPOFOL N/A 03/17/2016   Procedure: COLONOSCOPY WITH PROPOFOL;  Surgeon: Garlan Fair, MD;  Location: [facility];  Service: Endoscopy;  Laterality: N/A;   ESOPHAGOGASTRODUODENOSCOPY (EGD) WITH PROPOFOL N/A 03/17/2016   Procedure: ESOPHAGOGASTRODUODENOSCOPY (EGD) WITH PROPOFOL;  Surgeon: Garlan Fair, MD;  Location: [facility];  Service: Endoscopy;  Laterality: N/A;   JOINT REPLACEMENT     TONSILLECTOMY AND ADENOIDECTOMY     TOTAL HIP ARTHROPLASTY Right 03/25/2015   Procedure: TOTAL HIP ARTHROPLASTY ANTERIOR APPROACH;  Surgeon: Frederik Pear, MD;  Location: [place];  Service: Orthopedics;  Laterality: Right;   VIDEO BRONCHOSCOPY N/A 08/07/2019   Procedure: VIDEO BRONCHOSCOPY WITHOUT FLUORO;  Surgeon: Julian Hy, DO;  Location: [facility];  Service: Endoscopy;  Laterality: N/A;    FAMILY HISTORY: Family History  Problem Relation Age of Onset   Diabetes Mother    Heart disease Mother    Heart attack Mother    Bleeding Disorder Father    Peptic Ulcer Father    Heart disease Sister    Heart attack Sister     Cancer Sister    Deep vein thrombosis Sister    Diabetes Sister    Hyperlipidemia Sister    Hypertension Sister    Varicose Veins Sister    AAA (abdominal aortic aneurysm) Sister    Bleeding Disorder Sister    Breast cancer Sister    Heart disease Brother    Heart attack Brother    Hypertension Brother     SOCIAL HISTORY: Social History   Socioeconomic History   Marital status: Married    Spouse name: Not on file   Number of children: 3   Years of education: Not on file   Highest education level: Some college, no degree  Occupational History   Occupation: Retired  Tobacco Use   Smoking status: Former  Packs/day: 1.50    Years: 35.00    Pack years: 52.50    Types: Cigarettes    Quit date: 05/11/2000    Years since quitting: 20.4   Smokeless tobacco: Never  Vaping Use   Vaping Use: Never used  Substance and Sexual Activity   Alcohol use: Never    Alcohol/week: 0.0 standard drinks   Drug use: Never   Sexual activity: Not on file  Other Topics Concern   Not on file  Social History Narrative   Right handed   2 cups of caffeine daily   Lives at home with husband   Social Determinants of Health   Financial Resource Strain: Not on file  Food Insecurity: Not on file  Transportation Needs: Not on file  Physical Activity: Not on file  Stress: Not on file  Social Connections: Not on file  Intimate Partner Violence: Not on file   PHYSICAL EXAM  Vitals:   10/17/20 1014  BP: 122/72  Pulse: 68  Weight: 149 lb (67.6 kg)  Height: 5\' 3"  (1.6 m)   Body mass index is 26.39 kg/m.  Generalized: Well developed, in no acute distress  Skin: Circular light red/pink rash below umbilicus, but no vesicles or blisters Neurological examination  Mentation: Alert oriented to time, place, history taking. Follows all commands speech and language fluent Cranial nerve II-XII: Pupils were equal round reactive to light. Extraocular movements were full, visual field were full on  confrontational test. Facial sensation and strength were normal. Head turning and shoulder shrug  were normal and symmetric. Motor: Good strength all extremities Sensory: Sensory testing is intact to soft touch on all 4 extremities. No evidence of extinction is noted.  Coordination: Cerebellar testing reveals good finger-nose-finger and heel-to-shin bilaterally.  Gait and station: Gait is normal, when walking barefoot, is cautions, tends to walk with toes up Reflexes: Deep tendon reflexes are symmetric and normal bilaterally.   DIAGNOSTIC DATA (LABS, IMAGING, TESTING) - I reviewed patient records, labs, notes, testing and imaging myself where available.  Lab Results  Component Value Date   WBC 5.5 10/10/2019   HGB 10.3 (L) 10/10/2019   HCT 31.2 (L) 10/10/2019   MCV 94.8 10/10/2019   PLT 327 10/10/2019      Component Value Date/Time   NA 138 10/10/2019 1118   K 4.8 10/10/2019 1118   CL 105 10/10/2019 1118   CO2 27 10/10/2019 1118   GLUCOSE 75 10/10/2019 1118   BUN 19 10/10/2019 1118   CREATININE 0.83 10/10/2019 1118   CALCIUM 9.7 10/10/2019 1118   PROT 6.7 10/10/2019 1118   PROT 6.9 06/27/2018 1141   ALBUMIN 4.2 07/21/2019 1127   AST 15 10/10/2019 1118   ALT 15 10/10/2019 1118   ALKPHOS 48 07/21/2019 1127   BILITOT 0.6 10/10/2019 1118   GFRNONAA 67 10/10/2019 1118   GFRAA 78 10/10/2019 1118   No results found for: CHOL, HDL, LDLCALC, LDLDIRECT, TRIG, CHOLHDL No results found for: HGBA1C Lab Results  Component Value Date   VITAMINB12 1,721 (H) 06/27/2018   No results found for: TSH  ASSESSMENT AND PLAN 80 y.o. year old female  has a past medical history of AAA (abdominal aortic aneurysm) ([place]), ANA positive (08/06/2016), Aortic ectasia, abdominal ([place]) (1/60/7371), Asthma, Complication of anesthesia, DDD lumbar spine (08/13/2016), Degenerative joint disease of spine, Family history of adverse reaction to anesthesia, Former smoker (08/13/2016), GERD (gastroesophageal reflux  disease), High risk medication use (08/06/2016), History of total hip replacement, right (08/13/2016), Hyperlipidemia, Hypothyroidism, Idiopathic  peripheral neuropathy (08/13/2016), Lupus ([place]), Neuromuscular disorder ([place]), PONV (postoperative nausea and vomiting), Primary osteoarthritis of both feet (08/06/2016), Primary osteoarthritis of both hands (08/06/2016), Primary osteoarthritis of right hip (03/24/2015), Raynaud's disease without gangrene (08/06/2016), and Shortness of breath dyspnea. here with:  1.  Peripheral neuropathy -Will continue gabapentin -Receives hydrocodone from this office, have discussed she will no longer be able to receive once Dr. Jamey Ripa, she will check with her PCP, if they cannot accommodate, will refer to pain clinic -Follow-up here in 6 months or sooner if needed  2.  Right umbilicus nerve like pain, radiating to right flank -No obvious shingles rash noted, only going on 4 days  -Unclear etiology, symptoms consistent with neuralgia type pain, would thought shingles rash would have erupted by now -Will treat with 5 mg prednisone taper, follow up with PCP if no improvement -Can try gabapentin 600 mg 3 times daily for interim for pain, if this helps neuropathy will adjust prescription in future, she'll let me know  I spent 35 minutes of face-to-face and non-face-to-face time with patient.  This included previsit chart review, discussing symptoms, medications, management, follow-up.  Butler Denmark, AGNP-C, DNP 10/17/2020, 11:09 AM [facility] [address] [place], [place] [address] [phone]

## 2020-10-17 ENCOUNTER — Ambulatory Visit (INDEPENDENT_AMBULATORY_CARE_PROVIDER_SITE_OTHER): Payer: Medicare Other | Admitting: Neurology

## 2020-10-17 ENCOUNTER — Encounter: Payer: Self-pay | Admitting: Neurology

## 2020-10-17 VITALS — BP 122/72 | HR 68 | Ht 63.0 in | Wt 149.0 lb

## 2020-10-17 DIAGNOSIS — M6281 Muscle weakness (generalized): Secondary | ICD-10-CM | POA: Diagnosis not present

## 2020-10-17 DIAGNOSIS — R1031 Right lower quadrant pain: Secondary | ICD-10-CM | POA: Diagnosis not present

## 2020-10-17 DIAGNOSIS — M6289 Other specified disorders of muscle: Secondary | ICD-10-CM | POA: Diagnosis not present

## 2020-10-17 DIAGNOSIS — R102 Pelvic and perineal pain: Secondary | ICD-10-CM | POA: Diagnosis not present

## 2020-10-17 DIAGNOSIS — G609 Hereditary and idiopathic neuropathy, unspecified: Secondary | ICD-10-CM

## 2020-10-17 DIAGNOSIS — M792 Neuralgia and neuritis, unspecified: Secondary | ICD-10-CM | POA: Insufficient documentation

## 2020-10-17 DIAGNOSIS — M62838 Other muscle spasm: Secondary | ICD-10-CM | POA: Diagnosis not present

## 2020-10-17 MED ORDER — PREDNISONE 5 MG PO TABS: ORAL_TABLET | ORAL | 0 refills | Status: DC

## 2020-10-17 MED ORDER — HYDROCODONE-ACETAMINOPHEN 5-325 MG PO TABS: 1.0000 | ORAL_TABLET | Freq: Four times a day (QID) | ORAL | 0 refills | Status: DC | PRN

## 2020-10-17 NOTE — Patient Instructions (Signed)
Start taking prednisone 5 mg, take 6 tablets, then taper by 1 tablet daily until off daily  Try increasing gabapentin 600 mg 3 times daily for the interim, if this continues to be helpful I will adjust the prescription  Talk with your primary care about the hydrocodone going forward, if not, will refer to pain clinic See you back in 6 months

## 2020-10-17 NOTE — Progress Notes (Signed)
I have read the note, and I agree with the clinical assessment and plan.  Leila Schuff K [place]   

## 2020-10-21 ENCOUNTER — Telehealth: Payer: Self-pay | Admitting: Neurology

## 2020-10-21 NOTE — Telephone Encounter (Signed)
I called pharmacy they were working on on it then they had it on her profile since 10/17/2020.  I then called the patient and just relayed for her the next time to state to check her profile for new prescription for her hydrocodone.    Hopefully that will help.  She appreciated call back.

## 2020-10-21 NOTE — Telephone Encounter (Signed)
Pt called, stating pharmacy said they have not received refill for HYDROcodone-acetaminophen (NORCO/VICODIN) 5-325 MG tablet Would like a nurse from the nurse when prescription has been sent.

## 2020-10-22 ENCOUNTER — Ambulatory Visit (INDEPENDENT_AMBULATORY_CARE_PROVIDER_SITE_OTHER): Payer: Medicare Other | Admitting: Pulmonary Disease

## 2020-10-22 ENCOUNTER — Other Ambulatory Visit: Payer: Self-pay

## 2020-10-22 VITALS — BP 138/68 | HR 66 | Ht 63.0 in | Wt 142.0 lb

## 2020-10-22 DIAGNOSIS — M8580 Other specified disorders of bone density and structure, unspecified site: Secondary | ICD-10-CM | POA: Insufficient documentation

## 2020-10-22 DIAGNOSIS — J309 Allergic rhinitis, unspecified: Secondary | ICD-10-CM | POA: Insufficient documentation

## 2020-10-22 DIAGNOSIS — R918 Other nonspecific abnormal finding of lung field: Secondary | ICD-10-CM | POA: Diagnosis not present

## 2020-10-22 DIAGNOSIS — Z91030 Bee allergy status: Secondary | ICD-10-CM | POA: Insufficient documentation

## 2020-10-22 DIAGNOSIS — J479 Bronchiectasis, uncomplicated: Secondary | ICD-10-CM

## 2020-10-22 DIAGNOSIS — D649 Anemia, unspecified: Secondary | ICD-10-CM | POA: Insufficient documentation

## 2020-10-22 DIAGNOSIS — E7800 Pure hypercholesterolemia, unspecified: Secondary | ICD-10-CM | POA: Insufficient documentation

## 2020-10-22 DIAGNOSIS — J45909 Unspecified asthma, uncomplicated: Secondary | ICD-10-CM | POA: Insufficient documentation

## 2020-10-22 DIAGNOSIS — E039 Hypothyroidism, unspecified: Secondary | ICD-10-CM | POA: Insufficient documentation

## 2020-10-22 DIAGNOSIS — R630 Anorexia: Secondary | ICD-10-CM | POA: Insufficient documentation

## 2020-10-22 DIAGNOSIS — J449 Chronic obstructive pulmonary disease, unspecified: Secondary | ICD-10-CM | POA: Insufficient documentation

## 2020-10-22 NOTE — Patient Instructions (Signed)
Continue bevespi inhaler twice daily  Use albuterol as needed for cough, shortness of breath or wheezing.   Call us if symptoms worse and we will see you sooner.   CT Chest scan in 1 year. Follow up in 1 year

## 2020-10-22 NOTE — Progress Notes (Signed)
Synopsis: Referred in March 2021 for shortness of breath by Alroy Dust, L.Marlou Sa, MD. Former patient of Dr. Carlis Abbott.  Subjective:   PATIENT ID: Janet Giles GENDER: female DOB: Dec 19, 1940, MRN: [id number]   HPI  Chief Complaint  Patient presents with   Consult    Dr Carlis Abbott patient.  Follow up nodule.  Resent scan.    Janet Giles is an 80 year old woman, former smoker with COPD (FEV1 1.14L, 57%) and pulmonary nodule who returns to pulmonary clinic for follow up.   She was previously followed by Dr. Carlis Abbott. She is on Bevespi twice daily for her COPD and rarely uses albuterol. Her breathing remains overall well since starting the bevespi.   She had a follow-up CT chest scan on 09/23/20 which showed stable tree-in-bud nodularity of the RUL from previous scan 05/28/19. No new suspicious nodules and resolution of the LLL groundglass opacity.   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 Aura Fey   Neuromuscular disorder ([place])    neuropathy in feet   PONV (postoperative nausea and vomiting)    Primary osteoarthritis of both feet 08/06/2016   Primary osteoarthritis of both hands 08/06/2016   Primary osteoarthritis of right hip 03/24/2015   Raynaud's disease without gangrene 08/06/2016   Shortness of breath  dyspnea    uses inhaler occ     Family History  Problem Relation Age of Onset   Diabetes Mother    Heart disease Mother    Heart attack Mother    Bleeding Disorder Father    Peptic Ulcer Father    Heart disease Sister    Heart attack Sister    Cancer Sister    Deep vein thrombosis Sister    Diabetes Sister    Hyperlipidemia Sister    Hypertension Sister    Varicose Veins Sister    AAA (abdominal aortic aneurysm) Sister    Bleeding Disorder Sister    Breast cancer Sister    Heart disease Brother    Heart attack Brother    Hypertension Brother      Social History   Socioeconomic History   Marital status: Married    Spouse name: Not on file   Number of children: 3   Years of education: Not on file   Highest education level: Some college, no degree  Occupational History   Occupation: Retired  Tobacco Use   Smoking status: Former    Packs/day: 1.50    Years: 35.00    Pack years: 52.50    Types: Cigarettes    Quit date: 05/11/2000    Years since quitting: 20.4   Smokeless tobacco: Never  Vaping Use   Vaping Use: Never used  Substance and Sexual Activity  Alcohol use: Never    Alcohol/week: 0.0 standard drinks   Drug use: Never   Sexual activity: Not on file  Other Topics Concern   Not on file  Social History Narrative   Right handed   2 cups of caffeine daily   Lives at home with husband   Social Determinants of Health   Financial Resource Strain: Not on file  Food Insecurity: Not on file  Transportation Needs: Not on file  Physical Activity: Not on file  Stress: Not on file  Social Connections: Not on file  Intimate Partner Violence: Not on file     Allergies  Allergen Reactions   Bee Venom Anaphylaxis   Wasp Venom Shortness Of Breath and Swelling   Doxycycline Nausea And Vomiting   Lovastatin     Other reaction(s): muscle aches   Naproxen     Other reaction(s): nausea     Outpatient Medications Prior to Visit  Medication Sig Dispense  Refill   albuterol (PROVENTIL HFA;VENTOLIN HFA) 108 (90 BASE) MCG/ACT inhaler Inhale 1 puff into the lungs every 6 (six) hours as needed for wheezing or shortness of breath.     amoxicillin (AMOXIL) 500 MG capsule      Biotin 5000 MCG CAPS Take 5,000 mcg by mouth every evening.      Calcium Carb-Cholecalciferol (CALCIUM + D3 PO) Take 1 tablet by mouth daily.      ferrous gluconate (FERGON) 324 MG tablet Take 1 tablet (324 mg total) by mouth 2 (two) times daily with a meal. (Patient taking differently: Take 324 mg by mouth daily with breakfast.) 60 tablet 3   gabapentin (NEURONTIN) 600 MG tablet Take 1 tablet (600 mg total) by mouth 2 (two) times daily. 180 tablet 3   Glycopyrrolate-Formoterol (BEVESPI AEROSPHERE) 9-4.8 MCG/ACT AERO Inhale 2 puffs into the lungs 2 (two) times daily. 10.7 g 11   HYDROcodone-acetaminophen (NORCO/VICODIN) 5-325 MG tablet Take 1 tablet by mouth every 6 (six) hours as needed for moderate pain. Must last 28 days 60 tablet 0   levothyroxine (SYNTHROID, LEVOTHROID) 75 MCG tablet Take 75 mcg by mouth at bedtime.     predniSONE (DELTASONE) 5 MG tablet Start taking 6 tablets, then taper by 1 tablet daily until off the medication 21 tablet 0   vitamin B-12 (CYANOCOBALAMIN) 500 MCG tablet Take 500 mcg by mouth every evening.      No facility-administered medications prior to visit.    Review of Systems  Constitutional:  Negative for chills, fever, malaise/fatigue and weight loss.  HENT:  Negative for congestion, sinus pain and sore throat.   Eyes: Negative.   Respiratory:  Negative for cough, hemoptysis, sputum production, shortness of breath and wheezing.   Cardiovascular:  Negative for chest pain, palpitations, orthopnea, claudication and leg swelling.  Gastrointestinal:  Negative for abdominal pain, heartburn, nausea and vomiting.  Genitourinary: Negative.   Musculoskeletal:  Negative for joint pain and myalgias.  Skin:  Negative for rash.  Neurological:  Negative for  weakness.  Endo/Heme/Allergies: Negative.   Psychiatric/Behavioral: Negative.       Objective:   Vitals:   10/22/20 1156  BP: 138/68  Pulse: 66  SpO2: 95%  Weight: 142 lb (64.4 kg)  Height: _0  (1.6 m)     Physical Exam Constitutional:      General: She is not in acute distress.    Appearance: She is not ill-appearing.  HENT:     Head: Normocephalic and atraumatic.  Eyes:     General:  No scleral icterus.    Conjunctiva/sclera: Conjunctivae normal.     Pupils: Pupils are equal, round, and reactive to light.  Cardiovascular:     Rate and Rhythm: Normal rate and regular rhythm.     Pulses: Normal pulses.     Heart sounds: Normal heart sounds. No murmur heard. Pulmonary:     Effort: Pulmonary effort is normal.     Breath sounds: Normal breath sounds. No wheezing, rhonchi or rales.  Abdominal:     General: Bowel sounds are normal.     Palpations: Abdomen is soft.  Musculoskeletal:     Right lower leg: No edema.     Left lower leg: No edema.  Lymphadenopathy:     Cervical: No cervical adenopathy.  Skin:    General: Skin is warm and dry.  Neurological:     General: No focal deficit present.     Mental Status: She is alert.  Psychiatric:        Mood and Affect: Mood normal.        Behavior: Behavior normal.        Thought Content: Thought content normal.        Judgment: Judgment normal.    CBC    Component Value Date/Time   WBC 5.5 10/10/2019 1118   RBC 3.29 (L) 10/10/2019 1118   HGB 10.3 (L) 10/10/2019 1118   HCT 31.2 (L) 10/10/2019 1118   PLT 327 10/10/2019 1118   MCV 94.8 10/10/2019 1118   MCH 31.3 10/10/2019 1118   MCHC 33.0 10/10/2019 1118   RDW 12.5 10/10/2019 1118   LYMPHSABS 1,837 10/10/2019 1118   MONOABS 0.4 07/21/2019 1127   EOSABS 187 10/10/2019 1118   BASOSABS 22 10/10/2019 1118   BMP Latest Ref Rng & Units 10/10/2019 07/21/2019 02/14/2018  Glucose 65 - 99 mg/dL 75 87 70  BUN 7 - 25 mg/dL _0 Creatinine 0.60 - 0.93 mg/dL 0.83 0.77  0.81  BUN/Creat Ratio 6 - 22 (calc) NOT APPLICABLE - NOT APPLICABLE  Sodium 932 - 146 mmol/L 138 135 137  Potassium 3.5 - 5.3 mmol/L 4.8 5.0 5.3  Chloride 98 - 110 mmol/L 105 103 101  CO2 20 - 32 mmol/L _1 Calcium 8.6 - 10.4 mg/dL 9.7 9.9 10.6(H)    Chest imaging: CT Chest 09/23/2020 Stable tree-in-bud branching nodularity in the RIGHT upper lobe consistent with chronic infectious or inflammatory process. Resolution of ground-glass opacities in the LEFT lower lobe.  CT chest 07/05/2019 mild bronchiectasis in all lobes, right upper lobe peripheral tree-in-bud opacities, associated micronodules.  No significant nodularity in RML, minimal distal inferior lingular nodules. Linear scarring bilateral lower lobes.  Borderline mediastinal adenopathy.   CT chest 09/25/2019 Apical irregular nodules and tree-in-bud opacities.  Emphysema and airway thickening.  Pleural thickening of major fissure on the left.  All nodules shrinking other than nodule in azygo-esophageal recess, which is stable.  Senescent vascular calcification.  PFT: PFT Results Latest Ref Rng & Units 09/04/2019 01/09/2014  FVC-Pre L 1.72 2.01  FVC-Predicted Pre % 64 69  FVC-Post L 2.06 2.33  FVC-Predicted Post % 77 81  Pre FEV1/FVC % % 60 63  Post FEV1/FCV % % 55 60  FEV1-Pre L 1.03 1.26  FEV1-Predicted Pre % 52 58  FEV1-Post L 1.14 1.39  DLCO uncorrected ml/min/mmHg 12.20 11.79  DLCO UNC% % 64 48  DLCO corrected ml/min/mmHg 13.65 -  DLCO COR %Predicted % 72 -  DLVA Predicted % 93 69  TLC  L 4.90 4.45  TLC % Predicted % 96 88  RV % Predicted % 134 105  PFT 2021: moderately severe obstructive defect with mild diffusion defect   Labs: Micro: 08/07/2019 BAL fungus-penicillium species 08/07/2019 BAL AFB-negative 08/07/2019 BAL respiratory-negative 08/07/2019 BAL fungus lingula-negative 08/07/2019 BAL AFB lingula-negative 08/07/2019 BAL respiratory-negative  Echocardiogram 10/06/2018: LVEF 55 to 97%, grade 1 diastolic  dysfunction with elevated LVEDP.  Normal LA, RV, RA.  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  Mild MR.    Assessment & Plan:   Bronchiectasis without complication ([place]) - Plan: CT Chest Wo Contrast  Abnormal CT scan, lung - Plan: CT Chest Wo Contrast  Discussion: Loriana Samad is an 80 year old woman, former smoker with COPD (FEV1 1.14L, 57%) and pulmonary nodule who returns to pulmonary clinic for follow up.   She is to continue bevespi twice daily and as needed albuterol for her COPD which is stable at this time.   Her RUL tree-in-bud opacities remain stable over the past year. We will repeat a CT chest scan in 1 year with follow up visit.   Freda Jackson, MD [facility] Office: [phone]     Current Outpatient Medications:    albuterol (PROVENTIL HFA;VENTOLIN HFA) 108 (90 BASE) MCG/ACT inhaler, Inhale 1 puff into the lungs every 6 (six) hours as needed for wheezing or shortness of breath., Disp: , Rfl:    amoxicillin (AMOXIL) 500 MG capsule, , Disp: , Rfl:    Biotin 5000 MCG CAPS, Take 5,000 mcg by mouth every evening. , Disp: , Rfl:    Calcium Carb-Cholecalciferol (CALCIUM + D3 PO), Take 1 tablet by mouth daily. , Disp: , Rfl:    ferrous gluconate (FERGON) 324 MG tablet, Take 1 tablet (324 mg total) by mouth 2 (two) times daily with a meal. (Patient taking differently: Take 324 mg by mouth daily with breakfast.), Disp: 60 tablet, Rfl: 3   gabapentin (NEURONTIN) 600 MG tablet, Take 1 tablet (600 mg total) by mouth 2 (two) times daily., Disp: 180 tablet, Rfl: 3   Glycopyrrolate-Formoterol (BEVESPI AEROSPHERE) 9-4.8 MCG/ACT AERO, Inhale 2 puffs into the lungs 2 (two) times daily., Disp: 10.7 g, Rfl: 11   HYDROcodone-acetaminophen (NORCO/VICODIN) 5-325 MG tablet, Take 1 tablet by mouth every 6 (six) hours as needed for moderate pain. Must last 28 days, Disp: 60 tablet, Rfl: 0   levothyroxine (SYNTHROID,  LEVOTHROID) 75 MCG tablet, Take 75 mcg by mouth at bedtime., Disp: , Rfl:    predniSONE (DELTASONE) 5 MG tablet, Start taking 6 tablets, then taper by 1 tablet daily until off the medication, Disp: 21 tablet, Rfl: 0   vitamin B-12 (CYANOCOBALAMIN) 500 MCG tablet, Take 500 mcg by mouth every evening. , Disp: , Rfl:

## 2020-10-24 ENCOUNTER — Encounter: Payer: Self-pay | Admitting: Pulmonary Disease

## 2020-10-24 DIAGNOSIS — M62838 Other muscle spasm: Secondary | ICD-10-CM | POA: Diagnosis not present

## 2020-10-24 DIAGNOSIS — D485 Neoplasm of uncertain behavior of skin: Secondary | ICD-10-CM | POA: Diagnosis not present

## 2020-10-24 DIAGNOSIS — M6289 Other specified disorders of muscle: Secondary | ICD-10-CM | POA: Diagnosis not present

## 2020-10-24 DIAGNOSIS — L819 Disorder of pigmentation, unspecified: Secondary | ICD-10-CM | POA: Diagnosis not present

## 2020-10-24 DIAGNOSIS — M6281 Muscle weakness (generalized): Secondary | ICD-10-CM | POA: Diagnosis not present

## 2020-10-24 DIAGNOSIS — L720 Epidermal cyst: Secondary | ICD-10-CM | POA: Diagnosis not present

## 2020-10-24 DIAGNOSIS — R102 Pelvic and perineal pain: Secondary | ICD-10-CM | POA: Diagnosis not present

## 2020-10-24 DIAGNOSIS — L821 Other seborrheic keratosis: Secondary | ICD-10-CM | POA: Diagnosis not present

## 2020-10-24 DIAGNOSIS — L814 Other melanin hyperpigmentation: Secondary | ICD-10-CM | POA: Diagnosis not present

## 2020-10-24 DIAGNOSIS — L570 Actinic keratosis: Secondary | ICD-10-CM | POA: Diagnosis not present

## 2020-10-24 DIAGNOSIS — L820 Inflamed seborrheic keratosis: Secondary | ICD-10-CM | POA: Diagnosis not present

## 2020-10-24 DIAGNOSIS — L281 Prurigo nodularis: Secondary | ICD-10-CM | POA: Diagnosis not present

## 2020-10-24 DIAGNOSIS — D1801 Hemangioma of skin and subcutaneous tissue: Secondary | ICD-10-CM | POA: Diagnosis not present

## 2020-10-31 DIAGNOSIS — M6281 Muscle weakness (generalized): Secondary | ICD-10-CM | POA: Diagnosis not present

## 2020-10-31 DIAGNOSIS — M62838 Other muscle spasm: Secondary | ICD-10-CM | POA: Diagnosis not present

## 2020-10-31 DIAGNOSIS — M6289 Other specified disorders of muscle: Secondary | ICD-10-CM | POA: Diagnosis not present

## 2020-10-31 DIAGNOSIS — R102 Pelvic and perineal pain: Secondary | ICD-10-CM | POA: Diagnosis not present

## 2020-11-18 ENCOUNTER — Encounter: Payer: Self-pay | Admitting: Emergency Medicine

## 2020-11-18 ENCOUNTER — Telehealth: Payer: Self-pay | Admitting: Neurology

## 2020-11-18 NOTE — Telephone Encounter (Signed)
Duplicate request

## 2020-11-18 NOTE — Telephone Encounter (Signed)
Pt requestrefill HYDROcodone-acetaminophen (NORCO/VICODIN) 5-325 MG tablet at

## 2020-11-20 MED ORDER — HYDROCODONE-ACETAMINOPHEN 5-325 MG PO TABS: 1.0000 | ORAL_TABLET | Freq: Four times a day (QID) | ORAL | 0 refills | Status: DC | PRN

## 2020-12-17 DIAGNOSIS — M7061 Trochanteric bursitis, right hip: Secondary | ICD-10-CM | POA: Diagnosis not present

## 2020-12-18 DIAGNOSIS — H2511 Age-related nuclear cataract, right eye: Secondary | ICD-10-CM | POA: Diagnosis not present

## 2020-12-20 DIAGNOSIS — H2511 Age-related nuclear cataract, right eye: Secondary | ICD-10-CM | POA: Diagnosis not present

## 2020-12-20 DIAGNOSIS — H25811 Combined forms of age-related cataract, right eye: Secondary | ICD-10-CM | POA: Diagnosis not present

## 2020-12-31 DIAGNOSIS — M7061 Trochanteric bursitis, right hip: Secondary | ICD-10-CM | POA: Diagnosis not present

## 2021-01-02 DIAGNOSIS — M7061 Trochanteric bursitis, right hip: Secondary | ICD-10-CM | POA: Diagnosis not present

## 2021-01-07 DIAGNOSIS — M7061 Trochanteric bursitis, right hip: Secondary | ICD-10-CM | POA: Diagnosis not present

## 2021-01-09 DIAGNOSIS — M7061 Trochanteric bursitis, right hip: Secondary | ICD-10-CM | POA: Diagnosis not present

## 2021-01-15 DIAGNOSIS — M7061 Trochanteric bursitis, right hip: Secondary | ICD-10-CM | POA: Diagnosis not present

## 2021-01-21 DIAGNOSIS — M7061 Trochanteric bursitis, right hip: Secondary | ICD-10-CM | POA: Diagnosis not present

## 2021-01-23 DIAGNOSIS — M7061 Trochanteric bursitis, right hip: Secondary | ICD-10-CM | POA: Diagnosis not present

## 2021-01-28 DIAGNOSIS — M545 Low back pain, unspecified: Secondary | ICD-10-CM | POA: Diagnosis not present

## 2021-02-24 ENCOUNTER — Other Ambulatory Visit: Payer: Self-pay | Admitting: Orthopedic Surgery

## 2021-02-24 DIAGNOSIS — M5416 Radiculopathy, lumbar region: Secondary | ICD-10-CM | POA: Diagnosis not present

## 2021-02-24 DIAGNOSIS — M533 Sacrococcygeal disorders, not elsewhere classified: Secondary | ICD-10-CM | POA: Diagnosis not present

## 2021-02-26 ENCOUNTER — Other Ambulatory Visit: Payer: Self-pay | Admitting: Neurology

## 2021-02-26 ENCOUNTER — Telehealth: Payer: Self-pay | Admitting: Neurology

## 2021-02-26 NOTE — Telephone Encounter (Signed)
Please do not refill her opioid prescription. She has been advised it will not be refilled again since dr Jannifer Franklin is retiring. Last filled 7/13

## 2021-03-11 ENCOUNTER — Telehealth: Payer: Self-pay | Admitting: *Deleted

## 2021-03-11 DIAGNOSIS — Z23 Encounter for immunization: Secondary | ICD-10-CM | POA: Diagnosis not present

## 2021-03-11 NOTE — Telephone Encounter (Signed)
Called patient, husband answered phone. She was unavailable. I advised l will  call back. He stated to wait for about ne hour.

## 2021-03-11 NOTE — Telephone Encounter (Signed)
Spoke with patient who stated Rhea Pink, NP had informed her that after Dr Jannifer Franklin retired our office would no longer refill hydrocodone. She stated that she is currently undergoing eval by ortho to try to determine what's causing her back pain. She Is scheduled for injection in her back on Fri. She stated if source can't be found she'll need to find provider to refill hydrocodone. She was last prescribed hydrocodone in July, #60. She hasn't taken any since her July Rx ran out. She had no questions, I reminded her of FU. Patient verbalized understanding, appreciation.

## 2021-03-13 DIAGNOSIS — M545 Low back pain, unspecified: Secondary | ICD-10-CM | POA: Diagnosis not present

## 2021-03-14 ENCOUNTER — Ambulatory Visit
Admission: RE | Admit: 2021-03-14 | Discharge: 2021-03-14 | Disposition: A | Discharge status: Home / Self Care | Payer: Medicare Other | Source: Ambulatory Visit | Attending: Orthopedic Surgery | Admitting: Orthopedic Surgery

## 2021-03-14 DIAGNOSIS — M5416 Radiculopathy, lumbar region: Secondary | ICD-10-CM

## 2021-03-17 DIAGNOSIS — M533 Sacrococcygeal disorders, not elsewhere classified: Secondary | ICD-10-CM | POA: Diagnosis not present

## 2021-03-18 DIAGNOSIS — M533 Sacrococcygeal disorders, not elsewhere classified: Secondary | ICD-10-CM | POA: Diagnosis not present

## 2021-03-26 DIAGNOSIS — M533 Sacrococcygeal disorders, not elsewhere classified: Secondary | ICD-10-CM | POA: Diagnosis not present

## 2021-04-02 NOTE — Progress Notes (Signed)
Office Visit Note  Patient: Janet Giles             Date of Birth: 1940-09-22           MRN: [id number]             PCP: Alroy Dust, L.Marlou Sa, MD Referring: Alroy Dust, Carlean Jews.Marlou Sa, MD Visit Date: 04/10/2021 Occupation: @GUAROCC @  Subjective:  Lower back pain   History of Present Illness: Mirta Mally is a 80 y.o. female with a history of a skin lupus, positive ANA and Raynauds.  She states she has been having a lot of lower abdominal and vaginal pain.  She was evaluated by GYN at [facility] and was advised to go for pelvic floor exercises.  She states she went for a few sessions but she had to quit going due to her husband's illness.  She did not notice much improvement with that.  She has been experiencing increased discomfort in her right hip joint which has been replaced.  She went to see Dr. Mayer Camel who advised physical therapy.  She did not have any improvement with physical therapy.  She was referred to Dr. Lynann Bologna for the lower back pain.  He performed the right SI joint injection which gave her temporary relief and then the pain has come back.  She has been through physical therapy for lumbar spine as well but it exacerbated her pain symptoms.  She continues to have lower back pain and right hip pain.  She has difficulty sleeping at night.  She was recently evaluated by the pulmonologist and was told that she has COPD.  She continues to have Raynauds symptoms which are not severe.  She denies any history of digital ulcers.  Patient states that she has been on hydrocodone twice a day for many years by her neurosurgeon and later by her neurologist which was helping her a lot.  But now she is discontinued hydrocodone and has been experiencing increased pain.  She is planning to go to pain management.  Activities of Daily Living:  Patient reports morning stiffness for 3 hours.   Patient Reports nocturnal pain.  Difficulty dressing/grooming: Denies Difficulty climbing stairs:  Denies Difficulty getting out of chair: Denies Difficulty using hands for taps, buttons, cutlery, and/or writing: Reports  Review of Systems  Constitutional:  Negative for fatigue.  HENT:  Negative for mouth dryness.   Eyes:  Positive for dryness.  Respiratory:  Positive for shortness of breath.   Cardiovascular:  Negative for swelling in legs/feet.  Gastrointestinal:  Negative for constipation.  Endocrine: Positive for increased urination.  Genitourinary:  Negative for difficulty urinating.  Musculoskeletal:  Positive for joint pain, gait problem, joint pain, muscle weakness and morning stiffness.  Skin:  Negative for color change, rash and sensitivity to sunlight.  Allergic/Immunologic: Negative for susceptible to infections.  Neurological:  Positive for numbness and weakness.  Hematological:  Positive for bruising/bleeding tendency. Negative for swollen glands.  Psychiatric/Behavioral:  Positive for sleep disturbance. Negative for depressed mood. The patient is not nervous/anxious.    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   Former smoker 08/13/2016   ANA positive 08/06/2016   Raynaud's disease without gangrene 08/06/2016   Primary osteoarthritis of both hands 08/06/2016   Primary osteoarthritis of both feet 08/06/2016   High risk medication use 08/06/2016   Primary osteoarthritis of right hip 03/24/2015   Aortic ectasia, 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 Aura Fey   Neuromuscular disorder ([place])    neuropathy in feet   PONV (postoperative nausea and vomiting)    Primary osteoarthritis of both feet 08/06/2016   Primary osteoarthritis of both hands 08/06/2016   Primary osteoarthritis of right hip 03/24/2015   Raynaud's disease without gangrene 08/06/2016   Shortness of breath dyspnea    uses inhaler occ   Systemic lupus erythematosus ([place])     Family History  Problem Relation Age of Onset   Diabetes Mother    Heart disease Mother    Heart attack Mother    Bleeding Disorder Father    Peptic Ulcer Father    Heart disease Sister    Heart attack Sister    Cancer Sister    Deep vein thrombosis Sister    Diabetes Sister    Hyperlipidemia Sister    Hypertension Sister    Varicose Veins Sister    AAA (abdominal aortic aneurysm) Sister    Bleeding Disorder Sister    Breast cancer Sister    Heart disease Brother    Heart attack Brother    Hypertension Brother    Past Surgical History:  Procedure Laterality Date   ABDOMINAL HYSTERECTOMY     BACK SURGERY  02   L4-L5- fusion   BRONCHIAL BRUSHINGS  08/07/2019   Procedure: BRONCHIAL BRUSHINGS;   Surgeon: Julian Hy, DO;  Location: [facility];  Service: Endoscopy;;   BRONCHIAL WASHINGS  08/07/2019   Procedure: BRONCHIAL WASHINGS;  Surgeon: Julian Hy, DO;  Location: [facility];  Service: Endoscopy;;   COLONOSCOPY WITH PROPOFOL N/A 03/17/2016   Procedure: COLONOSCOPY WITH PROPOFOL;  Surgeon: Garlan Fair, MD;  Location: [facility];  Service: Endoscopy;  Laterality: N/A;   ESOPHAGOGASTRODUODENOSCOPY (EGD) WITH PROPOFOL N/A 03/17/2016   Procedure: ESOPHAGOGASTRODUODENOSCOPY (EGD) WITH PROPOFOL;  Surgeon: Garlan Fair, MD;  Location: [facility];  Service: Endoscopy;  Laterality: N/A;   JOINT REPLACEMENT     TONSILLECTOMY AND ADENOIDECTOMY     TOTAL HIP ARTHROPLASTY Right 03/25/2015   Procedure: TOTAL HIP ARTHROPLASTY ANTERIOR APPROACH;  Surgeon: Frederik Pear, MD;  Location: [place];  Service: Orthopedics;  Laterality: Right;   VIDEO BRONCHOSCOPY N/A 08/07/2019  Procedure: VIDEO BRONCHOSCOPY WITHOUT FLUORO;  Surgeon: Julian Hy, DO;  Location: [facility];  Service: Endoscopy;  Laterality: N/A;   Social History   Social History Narrative   Right handed   2 cups of caffeine daily   Lives at home with husband   Immunization History  Administered Date(s) Administered   Fluad Quad(high Dose 65+) 02/20/2019   PFIZER(Purple Top)SARS-COV-2 Vaccination 05/24/2019, 06/14/2019, 02/26/2020   Pneumococcal Conjugate-13 08/14/2014   Pneumococcal Polysaccharide-23 10/11/1998, 06/29/2008   Td 08/13/2016   Tdap 06/29/2005     Objective: Vital Signs: BP 116/65 (BP Location: Left Arm, Patient Position: Sitting, Cuff Size: Normal)   Pulse 82   Resp 16   Ht 5\' 4"  (1.626 m)   Wt 148 lb (67.1 kg)   BMI 25.40 kg/m    Physical Exam Vitals and nursing note reviewed.  Constitutional:      Appearance: She is well-developed.  HENT:     Head: Normocephalic and atraumatic.  Eyes:     Conjunctiva/sclera: Conjunctivae normal.  Cardiovascular:     Rate and Rhythm: Normal rate  and regular rhythm.     Heart sounds: Normal heart sounds.  Pulmonary:     Effort: Pulmonary effort is normal.     Breath sounds: Normal breath sounds.  Abdominal:     General: Bowel sounds are normal.     Palpations: Abdomen is soft.  Musculoskeletal:     Cervical back: Normal range of motion.  Lymphadenopathy:     Cervical: No cervical adenopathy.  Skin:    General: Skin is warm and dry.     Capillary Refill: Capillary refill takes less than 2 seconds.  Neurological:     Mental Status: She is alert and oriented to person, place, and time.  Psychiatric:        Behavior: Behavior normal.     Musculoskeletal Exam: She had good range of motion of her cervical spine.  She had painful limited range of motion of her lumbar spine.  She had tenderness in the lower lumbar region.  Shoulder joints and elbow joints in good range of motion.  She had bilateral CMC thickening and subluxation.  PIP and DIP thickening was noted.  Her right hip joint is replaced.  She has some tenderness over right trochanteric bursa.  Knee joints with good range of motion.  She had no tenderness over ankles or MTPs.  CDAI Exam: CDAI Score: -- Patient Global: --; Provider Global: -- Swollen: --; Tender: -- Joint Exam 04/10/2021   No joint exam has been documented for this visit   There is currently no information documented on the homunculus. Go to the Rheumatology activity and complete the homunculus joint exam.  Investigation: No additional findings.  Imaging: CT BIOPSY  Result Date: 03/14/2021 CLINICAL DATA:  Low back/hip pain with right lower extremity radiculopathy EXAM: Right CT GUIDED SI JOINT INJECTION PROCEDURE: After a thorough discussion of risks and benefits of the procedure, including bleeding, infection, injury to nerves, blood vessels, and adjacent structures, verbal and written consent was obtained. The patient was placed prone on the CT table and localization was performed over the sacrum.  Target site marked using CT guidance. The skin was prepped and draped in the usual sterile fashion using Betadine soap. After local anesthesia with 1% lidocaine without epinephrine and subsequent deep anesthesia, a 22 gauge spinal needle was advanced into the right SI joint under intermittent CT guidance. Once the needle was in satisfactory position, representative image was captured with  the needle demonstrated in the sacroiliac joint. Subsequently, 3 mL of 0.5% bupivacaine was injected into the right SI joint. Needles removed and a sterile dressing applied. No complications were observed. IMPRESSION: Successful CT-guided right SI joint injection with anesthetic only. Immediately after procedure, the patient reported improvement in right-sided low back/lower extremity pain. Electronically Signed   By: Albin Felling M.D.   On: 03/14/2021 09:52    Recent Labs: Lab Results  Component Value Date   WBC 5.5 10/10/2019   HGB 10.3 (L) 10/10/2019   PLT 327 10/10/2019   NA 138 10/10/2019   K 4.8 10/10/2019   CL 105 10/10/2019   CO2 27 10/10/2019   GLUCOSE 75 10/10/2019   BUN 19 10/10/2019   CREATININE 0.83 10/10/2019   BILITOT 0.6 10/10/2019   ALKPHOS 48 07/21/2019   AST 15 10/10/2019   ALT 15 10/10/2019   PROT 6.7 10/10/2019   ALBUMIN 4.2 07/21/2019   CALCIUM 9.7 10/10/2019   GFRAA 78 10/10/2019    Speciality Comments: No specialty comments available.  Procedures:  No procedures performed Allergies: Bee venom, Wasp venom, Doxycycline, Lovastatin, and Naproxen   Assessment / Plan:     Visit Diagnoses: Autoimmune disease ([place]) - +ANA 1:40, ds DNA -, Raynauds , history of skin lupus biopsy proven by Dr. Ubaldo Glassing per patient. -Besides Raynauds and skin lupus she does not have any other clinical features of autoimmune disease.  She has been followed by dermatologist for skin lupus.  I will obtain autoimmune labs today.  Her labs have been stable.  Plan: CBC with Differential/Platelet, COMPLETE  METABOLIC PANEL WITH GFR, Urinalysis, Routine w reflex microscopic, Anti-DNA antibody, double-stranded, C3 and C4, Sedimentation rate  High risk medication use - off of Plaquenil since April 2018.  I do not see any need for immunosuppression.  Raynaud's disease without gangrene-she has mild symptoms.  Wearing warm clothing and keeping core temperature warm was discussed.  She had no digital ulcers and no nailbed capillary changes.  Primary osteoarthritis of both hands-she has severe osteoarthritis in her bilateral hands involving CMC PIP and DIP joints.  She continues to have pain and discomfort.  Joint protection was discussed.  Trigger middle finger of right hand-she has intermittent symptoms.  History of total hip replacement, right - Dr. Mayer Camel.  She has been having increased pain and discomfort in her right hip over the trochanteric bursa.  She was recently evaluated by Dr. Mayer Camel and was advised physical therapy.  She did not notice any improvement from physical therapy.  IT band stretching exercises were discussed.  Primary osteoarthritis of both feet-she has some stiffness in her feet but no synovitis was noted.  DDD (degenerative disc disease), lumbar-she continues to have lower back pain and discomfort.  She had surgery in the past.  She will follow-up with Dr. Lynann Bologna.  Patient states she has been having increased pain and discomfort since she has been off hydrocodone.  She is planning to schedule an appointment with the pain management.  Osteopenia of multiple sites-she is on calcium rich diet.  Idiopathic peripheral neuropathy  Aortic ectasia, abdominal (HCC)  Other fatigue  History of COPD-she is followed by pulmonologist.  Former smoker  Orders: Orders Placed This Encounter  Procedures   CBC with Differential/Platelet   COMPLETE METABOLIC PANEL WITH GFR   Urinalysis, Routine w reflex microscopic   Anti-DNA antibody, double-stranded   C3 and C4   Sedimentation rate     No orders of the defined types were placed  in this encounter.  Face-to-face time spent patient was 30 minutes.  More than 50% time was spent counseling and coordination of care.   Follow-Up Instructions: Return in about 1 year (around 04/10/2022) for Autoimmune disease.   Bo Merino, MD  Note - This record has been created using Editor, commissioning.  Chart creation errors have been sought, but may not always  have been located. Such creation errors do not reflect on  the standard of medical care.

## 2021-04-07 ENCOUNTER — Telehealth: Payer: Self-pay | Admitting: Pulmonary Disease

## 2021-04-07 MED ORDER — BEVESPI AEROSPHERE 9-4.8 MCG/ACT IN AERO: 2.0000 | INHALATION_SPRAY | Freq: Two times a day (BID) | RESPIRATORY_TRACT | 11 refills | Status: DC

## 2021-04-07 NOTE — Telephone Encounter (Signed)
Rx has been printed for Dr. Erin Fulling to sign once he returns to the office Wed. 11/30. Called and spoke with pt letting her know this info and she verbalized understanding. Fax cover sheet has been placed with the Rx so we can get it faxed to [facility] for pt once signed. Nothing further needed.

## 2021-04-10 ENCOUNTER — Ambulatory Visit (INDEPENDENT_AMBULATORY_CARE_PROVIDER_SITE_OTHER): Payer: Medicare Other | Admitting: Rheumatology

## 2021-04-10 ENCOUNTER — Encounter: Payer: Self-pay | Admitting: Rheumatology

## 2021-04-10 ENCOUNTER — Other Ambulatory Visit: Payer: Self-pay

## 2021-04-10 VITALS — BP 116/65 | HR 82 | Resp 16 | Ht 64.0 in | Wt 148.0 lb

## 2021-04-10 DIAGNOSIS — I7300 Raynaud's syndrome without gangrene: Secondary | ICD-10-CM

## 2021-04-10 DIAGNOSIS — M5136 Other intervertebral disc degeneration, lumbar region: Secondary | ICD-10-CM

## 2021-04-10 DIAGNOSIS — M19072 Primary osteoarthritis, left ankle and foot: Secondary | ICD-10-CM

## 2021-04-10 DIAGNOSIS — M65331 Trigger finger, right middle finger: Secondary | ICD-10-CM | POA: Diagnosis not present

## 2021-04-10 DIAGNOSIS — Z96641 Presence of right artificial hip joint: Secondary | ICD-10-CM

## 2021-04-10 DIAGNOSIS — M19042 Primary osteoarthritis, left hand: Secondary | ICD-10-CM

## 2021-04-10 DIAGNOSIS — M19041 Primary osteoarthritis, right hand: Secondary | ICD-10-CM | POA: Diagnosis not present

## 2021-04-10 DIAGNOSIS — R5383 Other fatigue: Secondary | ICD-10-CM

## 2021-04-10 DIAGNOSIS — M359 Systemic involvement of connective tissue, unspecified: Secondary | ICD-10-CM | POA: Diagnosis not present

## 2021-04-10 DIAGNOSIS — M8589 Other specified disorders of bone density and structure, multiple sites: Secondary | ICD-10-CM

## 2021-04-10 DIAGNOSIS — I77811 Abdominal aortic ectasia: Secondary | ICD-10-CM | POA: Diagnosis not present

## 2021-04-10 DIAGNOSIS — Z87891 Personal history of nicotine dependence: Secondary | ICD-10-CM

## 2021-04-10 DIAGNOSIS — G609 Hereditary and idiopathic neuropathy, unspecified: Secondary | ICD-10-CM

## 2021-04-10 DIAGNOSIS — M19071 Primary osteoarthritis, right ankle and foot: Secondary | ICD-10-CM

## 2021-04-10 DIAGNOSIS — Z8709 Personal history of other diseases of the respiratory system: Secondary | ICD-10-CM

## 2021-04-10 DIAGNOSIS — Z79899 Other long term (current) drug therapy: Secondary | ICD-10-CM

## 2021-04-11 LAB — CBC WITH DIFFERENTIAL/PLATELET
Absolute Monocytes: 384 cells/uL (ref 200–950)
Basophils Absolute: 32 cells/uL (ref 0–200)
Basophils Relative: 0.5 %
Eosinophils Absolute: 139 cells/uL (ref 15–500)
Eosinophils Relative: 2.2 %
HCT: 32 % — ABNORMAL LOW (ref 35.0–45.0)
Hemoglobin: 10.6 g/dL — ABNORMAL LOW (ref 11.7–15.5)
Lymphs Abs: 1613 cells/uL (ref 850–3900)
MCH: 31.8 pg (ref 27.0–33.0)
MCHC: 33.1 g/dL (ref 32.0–36.0)
MCV: 96.1 fL (ref 80.0–100.0)
MPV: 9.1 fL (ref 7.5–12.5)
Monocytes Relative: 6.1 %
Neutro Abs: 4133 cells/uL (ref 1500–7800)
Neutrophils Relative %: 65.6 %
Platelets: 324 10*3/uL (ref 140–400)
RBC: 3.33 10*6/uL — ABNORMAL LOW (ref 3.80–5.10)
RDW: 12.6 % (ref 11.0–15.0)
Total Lymphocyte: 25.6 %
WBC: 6.3 10*3/uL (ref 3.8–10.8)

## 2021-04-11 LAB — COMPLETE METABOLIC PANEL WITH GFR
AG Ratio: 1.6 (calc) (ref 1.0–2.5)
ALT: 20 U/L (ref 6–29)
AST: 19 U/L (ref 10–35)
Albumin: 4 g/dL (ref 3.6–5.1)
Alkaline phosphatase (APISO): 47 U/L (ref 37–153)
BUN: 22 mg/dL (ref 7–25)
CO2: 28 mmol/L (ref 20–32)
Calcium: 9.5 mg/dL (ref 8.6–10.4)
Chloride: 105 mmol/L (ref 98–110)
Creat: 0.87 mg/dL (ref 0.60–0.95)
Globulin: 2.5 g/dL (calc) (ref 1.9–3.7)
Glucose, Bld: 140 mg/dL — ABNORMAL HIGH (ref 65–99)
Potassium: 4.4 mmol/L (ref 3.5–5.3)
Sodium: 141 mmol/L (ref 135–146)
Total Bilirubin: 0.5 mg/dL (ref 0.2–1.2)
Total Protein: 6.5 g/dL (ref 6.1–8.1)
eGFR: 67 mL/min/{1.73_m2} (ref >=60)

## 2021-04-11 LAB — URINALYSIS, ROUTINE W REFLEX MICROSCOPIC
Bilirubin Urine: NEGATIVE
Glucose, UA: NEGATIVE
Hgb urine dipstick: NEGATIVE
Ketones, ur: NEGATIVE
Leukocytes,Ua: NEGATIVE
Nitrite: NEGATIVE
Protein, ur: NEGATIVE
Specific Gravity, Urine: 1.011 (ref 1.001–1.035)
pH: 6.5 (ref 5.0–8.0)

## 2021-04-11 LAB — ANTI-DNA ANTIBODY, DOUBLE-STRANDED: ds DNA Ab: 2 IU/mL

## 2021-04-11 LAB — C3 AND C4
C3 Complement: 139 mg/dL (ref 83–193)
C4 Complement: 15 mg/dL (ref 15–57)

## 2021-04-11 LAB — SEDIMENTATION RATE: Sed Rate: 19 mm/h (ref 0–30)

## 2021-04-15 ENCOUNTER — Telehealth: Payer: Self-pay | Admitting: Neurology

## 2021-04-15 NOTE — Telephone Encounter (Signed)
LVM appt was rescheduled due to Judson Roch being out

## 2021-04-21 DIAGNOSIS — M533 Sacrococcygeal disorders, not elsewhere classified: Secondary | ICD-10-CM | POA: Diagnosis not present

## 2021-04-28 ENCOUNTER — Ambulatory Visit: Payer: Medicare Other | Admitting: Neurology

## 2021-05-12 NOTE — Progress Notes (Signed)
PATIENT: Janet Giles DOB: 30-May-1940  REASON FOR VISIT: follow up for small fiber neuropathy  HISTORY FROM: patient Primary Neurologist: Dr. Jannifer Franklin   Today 05/13/21 Janet Giles is here today for follow-up with history of small fiber neuropathy.  On gabapentin from this office. Seeing Dr. Lynann Bologna orthopedist about her back. Did PT without any benefit. Got a shot in SI joint, didn't help, mentioned a surgery on her SI joint, she wasn't sure about it. Isn't taking any more hydrocodone which was coming from Dr. Jannifer Franklin for neuropathy. Referred for pain clinic, appointment is in March. Since without the hydrocodone, the back pain has been an issue which is her biggest problem. No falls. With the feet still burning, tingling, numbness, cold. Wears double socks. Doesn't sleep great at night due to the back. Here today alone.  Update 10/17/2020 SS: Janet Giles is an 81 year old female with history of small fiber neuropathy and lupus.  On gabapentin.  Stopped alpha lipoic acid after she felt not helpful for several months.  Continues to report burning to her feet, has to wear thick socks, sandals.  Takes hydrocodone twice daily.  Pain is stable.  Mentions a new problem for the last 4 days, nerve type pain to her right lower abdomen from umbilicus to mid right flank Light red rash, no lesions, circular.  Denies diarrhea or blood in stool.  Pain feels okay during the day, at night.  Is painful, very tender to any touch. Saw PCP, labs pending.  HISTORY  04/17/2020 Dr. Jannifer Franklin: Janet Giles is a 81 year old right-handed white female with a history of a small fiber neuropathy and history of lupus.  The patient is not on any disease modifying agents for lupus.  The patient has discomfort in both feet, she may have some burning in the feet at night, and has to stick the feet out from the covers.  She also has some discomfort in the ball of the foot with weightbearing.  The patient has to wear sandals  with open toes as other shoes are uncomfortable.  The patient has not had any recent falls, she does not use a cane for ambulation.  In the past, she could not tolerate Cymbalta.  She remains on gabapentin 600 mg twice daily.  She was to go back on Keppra on the last visit but did not, she was told to get on alpha lipoic acid but did not.  She returns for an evaluation.  REVIEW OF SYSTEMS: Out of a complete 14 system review of symptoms, the patient complains only of the following symptoms, and all other reviewed systems are negative.  See HPI  ALLERGIES: Allergies  Allergen Reactions   Bee Venom Anaphylaxis   Wasp Venom Shortness Of Breath and Swelling   Doxycycline Nausea And Vomiting   Lovastatin     Other reaction(s): muscle aches   Naproxen     Other reaction(s): nausea    HOME MEDICATIONS: Outpatient Medications Prior to Visit  Medication Sig Dispense Refill   albuterol (PROVENTIL HFA;VENTOLIN HFA) 108 (90 BASE) MCG/ACT inhaler Inhale 1 puff into the lungs every 6 (six) hours as needed for wheezing or shortness of breath.     ALPHA LIPOIC ACID PO Take by mouth.     Biotin 5000 MCG CAPS Take 5,000 mcg by mouth every evening.     Calcium Carb-Cholecalciferol (CALCIUM + D3 PO) Take 1 tablet by mouth daily.      Ferrous Sulfate (IRON) 325 (65 Fe) MG TABS Take  1 tablet by mouth.     gabapentin (NEURONTIN) 600 MG tablet Take 1 tablet (600 mg total) by mouth 2 (two) times daily. 180 tablet 3   Glycopyrrolate-Formoterol (BEVESPI AEROSPHERE) 9-4.8 MCG/ACT AERO Inhale 2 puffs into the lungs 2 (two) times daily. 10.7 g 11   levothyroxine (SYNTHROID, LEVOTHROID) 75 MCG tablet Take 75 mcg by mouth at bedtime.     Multiple Vitamin (ONE-A-DAY ESSENTIAL PO) Take by mouth.     vitamin B-12 (CYANOCOBALAMIN) 500 MCG tablet Take 500 mcg by mouth every evening.     amoxicillin (AMOXIL) 500 MG capsule  (Patient not taking: Reported on 04/10/2021)     ferrous gluconate (FERGON) 324 MG tablet Take 1  tablet (324 mg total) by mouth 2 (two) times daily with a meal. (Patient taking differently: Take 324 mg by mouth daily with breakfast.) 60 tablet 3   predniSONE (DELTASONE) 5 MG tablet Start taking 6 tablets, then taper by 1 tablet daily until off the medication (Patient not taking: Reported on 04/10/2021) 21 tablet 0   No facility-administered medications prior to visit.    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 Carloyn Manner   Degenerative joint disease of spine    buttocks pain -remains a problem   Family history of adverse reaction to anesthesia    one sister nasea and vomiting   Former smoker 08/13/2016   GERD (gastroesophageal reflux disease)    occ   High risk medication use 08/06/2016   History of total hip replacement, right 08/13/2016   Hyperlipidemia    Hypothyroidism    Idiopathic peripheral neuropathy 08/13/2016   Lupus ([place])    Dr. Aura Fey   Neuromuscular disorder ([place])    neuropathy in feet   PONV (postoperative nausea and vomiting)    Primary osteoarthritis of both feet 08/06/2016   Primary osteoarthritis of both hands 08/06/2016   Primary osteoarthritis of right hip 03/24/2015   Raynaud's disease without gangrene 08/06/2016   Shortness of breath dyspnea    uses inhaler occ   Systemic lupus erythematosus ([place])     PAST SURGICAL HISTORY: Past Surgical History:  Procedure Laterality Date   ABDOMINAL HYSTERECTOMY     BACK SURGERY  02   L4-L5- fusion   BRONCHIAL BRUSHINGS  08/07/2019   Procedure: BRONCHIAL BRUSHINGS;  Surgeon: Julian Hy, DO;  [facility]: [facility];  Service: Endoscopy;;   BRONCHIAL WASHINGS  08/07/2019   Procedure:  BRONCHIAL WASHINGS;  Surgeon: Julian Hy, DO;  [facility];  Service: Endoscopy;;   COLONOSCOPY WITH PROPOFOL N/A 03/17/2016   Procedure: COLONOSCOPY WITH PROPOFOL;  Surgeon: Garlan Fair, MD;  [facility];  Service: Endoscopy;  Laterality: N/A;   ESOPHAGOGASTRODUODENOSCOPY (EGD) WITH PROPOFOL N/A 03/17/2016   Procedure: ESOPHAGOGASTRODUODENOSCOPY (EGD) WITH PROPOFOL;  Surgeon: Garlan Fair, MD;  [facility];  Service: Endoscopy;  Laterality: N/A;   JOINT REPLACEMENT     TONSILLECTOMY AND ADENOIDECTOMY     TOTAL HIP ARTHROPLASTY Right 03/25/2015   Procedure: TOTAL HIP ARTHROPLASTY ANTERIOR APPROACH;  Surgeon: Frederik Pear, MD;  Location: [facility];  Service: Orthopedics;  Laterality: Right;   VIDEO BRONCHOSCOPY N/A 08/07/2019  Procedure: VIDEO BRONCHOSCOPY WITHOUT FLUORO;  Surgeon: Julian Hy, DO;  [facility];  Service: Endoscopy;  Laterality: N/A;    FAMILY HISTORY: Family History  Problem Relation Age of Onset   Diabetes Mother    Heart disease Mother    Heart attack Mother    Bleeding Disorder Father    Peptic Ulcer Father    Heart disease Sister    Heart attack Sister    Cancer Sister    Deep vein thrombosis Sister    Diabetes Sister    Hyperlipidemia Sister    Hypertension Sister    Varicose Veins Sister    AAA (abdominal aortic aneurysm) Sister    Bleeding Disorder Sister    Breast cancer Sister    Heart disease Brother    Heart attack Brother    Hypertension Brother     SOCIAL HISTORY: Social History   Socioeconomic History   Marital status: Married    Spouse name: Not on file   Number of children: 3   Years of education: Not on file   Highest education level: Some college, no degree  Occupational History   Occupation: Retired  Tobacco Use   Smoking status: Former    Packs/day: 1.50    Years: 30.00    Pack years: 45.00    Types: Cigarettes    Quit date: 05/11/2000    Years since quitting: 21.0    Smokeless tobacco: Never  Vaping Use   Vaping Use: Never used  Substance and Sexual Activity   Alcohol use: Never    Alcohol/week: 0.0 standard drinks   Drug use: Never   Sexual activity: Not on file  Other Topics Concern   Not on file  Social History Narrative   Right handed   2 cups of caffeine daily   Lives at home with husband   Social Determinants of Health   Financial Resource Strain: Not on file  Food Insecurity: Not on file  Transportation Needs: Not on file  Physical Activity: Not on file  Stress: Not on file  Social Connections: Not on file  Intimate Partner Violence: Not on file   PHYSICAL EXAM  Vitals:   05/13/21 0904  BP: 122/70  Pulse: 78  Weight: 141 lb 8 oz (64.2 kg)  Height: 5\' 4"  (1.626 m)    Body mass index is 24.29 kg/m.  Generalized: Well developed, in no acute distress  Neurological examination  Mentation: Alert oriented to time, place, history taking. Follows all commands speech and language fluent Cranial nerve II-XII: Pupils were equal round reactive to light. Extraocular movements were full, visual field were full on confrontational test. Facial sensation and strength were normal. Head turning and shoulder shrug  were normal and symmetric. Motor: Good strength all extremities Sensory: stocking pattern sensory deficit to lower extremities to soft touch, pin prick Coordination: Cerebellar testing reveals good finger-nose-finger and heel-to-shin bilaterally.  Gait and station: Gait is normal,, but slightly wide-based, is independent Reflexes: Deep tendon reflexes are symmetric and normal bilaterally.   DIAGNOSTIC DATA (LABS, IMAGING, TESTING) - I reviewed patient records, labs, notes, testing and imaging myself where available.  Lab Results  Component Value Date   WBC 6.3 04/10/2021   HGB 10.6 (L) 04/10/2021   HCT 32.0 (L) 04/10/2021   MCV 96.1 04/10/2021   PLT 324 04/10/2021      Component Value Date/Time   NA 141 04/10/2021 1142    K 4.4 04/10/2021 1142   CL 105 04/10/2021 1142  CO2 28 04/10/2021 1142   GLUCOSE 140 (H) 04/10/2021 1142   BUN 22 04/10/2021 1142   CREATININE 0.87 04/10/2021 1142   CALCIUM 9.5 04/10/2021 1142   PROT 6.5 04/10/2021 1142   PROT 6.9 06/27/2018 1141   ALBUMIN 4.2 07/21/2019 1127   AST 19 04/10/2021 1142   ALT 20 04/10/2021 1142   ALKPHOS 48 07/21/2019 1127   BILITOT 0.5 04/10/2021 1142   GFRNONAA 67 10/10/2019 1118   GFRAA 78 10/10/2019 1118   No results found for: CHOL, HDL, LDLCALC, LDLDIRECT, TRIG, CHOLHDL No results found for: HGBA1C Lab Results  Component Value Date   VITAMINB12 1,721 (H) 06/27/2018   No results found for: TSH  ASSESSMENT AND PLAN 81 y.o. year old female  has a past medical history of AAA (abdominal aortic aneurysm), ANA positive (08/06/2016), Aortic ectasia, abdominal ([place]) (70/14/1030), Asthma, Complication of anesthesia, COPD (chronic obstructive pulmonary disease) ([place]), DDD lumbar spine (08/13/2016), Degenerative joint disease of spine, Family history of adverse reaction to anesthesia, Former smoker (08/13/2016), GERD (gastroesophageal reflux disease), High risk medication use (08/06/2016), History of total hip replacement, right (08/13/2016), Hyperlipidemia, Hypothyroidism, Idiopathic peripheral neuropathy (08/13/2016), Lupus ([place]), Neuromuscular disorder ([place]), PONV (postoperative nausea and vomiting), Primary osteoarthritis of both feet (08/06/2016), Primary osteoarthritis of both hands (08/06/2016), Primary osteoarthritis of right hip (03/24/2015), Raynaud's disease without gangrene (08/06/2016), Shortness of breath dyspnea, and Systemic lupus erythematosus ([place]). here with:  1.  Peripheral neuropathy  -Will continue gabapentin, I offered to increase up to 3 times daily to help with pain from back, neuropathy until in with pain management but she declined -Sees pain clinic in March, I also gave her contact info for [facility] pain  clinic to see if can be seen any sooner  -I refilled gabapentin for 3 months, can discuss pain clinic taking over gabapentin refill, if so can return here PRN basis for new or worsening symptoms  Butler Denmark, AGNP-C, DNP 05/13/2021, 9:18 AM [facility] [address] [place], [place] [address] [phone]

## 2021-05-13 ENCOUNTER — Encounter: Payer: Self-pay | Admitting: Neurology

## 2021-05-13 ENCOUNTER — Ambulatory Visit (INDEPENDENT_AMBULATORY_CARE_PROVIDER_SITE_OTHER): Payer: Medicare Other | Admitting: Neurology

## 2021-05-13 VITALS — BP 122/70 | HR 78 | Ht 64.0 in | Wt 141.5 lb

## 2021-05-13 DIAGNOSIS — G609 Hereditary and idiopathic neuropathy, unspecified: Secondary | ICD-10-CM | POA: Diagnosis not present

## 2021-05-13 MED ORDER — GABAPENTIN 600 MG PO TABS: 600.0000 mg | ORAL_TABLET | Freq: Two times a day (BID) | ORAL | 0 refills | Status: DC

## 2021-05-13 NOTE — Patient Instructions (Signed)
Great to see you today  Will continue the gabapentin until you see the pain clinic, please discuss having them continue this Please reach out to me about the results of the pain visit, we can schedule follow up from there

## 2021-05-27 ENCOUNTER — Other Ambulatory Visit: Payer: Self-pay

## 2021-05-27 ENCOUNTER — Ambulatory Visit (HOSPITAL_COMMUNITY): Payer: Medicare Other | Attending: Cardiovascular Disease

## 2021-05-27 DIAGNOSIS — I350 Nonrheumatic aortic (valve) stenosis: Secondary | ICD-10-CM

## 2021-05-27 LAB — ECHOCARDIOGRAM COMPLETE
AR max vel: 1.8 cm2
AV Area VTI: 1.72 cm2
AV Area mean vel: 1.88 cm2
AV Mean grad: 9 mmHg
AV Peak grad: 15.7 mmHg
Ao pk vel: 1.98 m/s
Area-P 1/2: 3.77 cm2
S' Lateral: 2.1 cm

## 2021-06-12 ENCOUNTER — Telehealth: Payer: Self-pay | Admitting: Internal Medicine

## 2021-06-12 NOTE — Telephone Encounter (Signed)
Called patient but she did not answer. Left message for her to call back.  

## 2021-06-16 MED ORDER — BEVESPI AEROSPHERE 9-4.8 MCG/ACT IN AERO: 2.0000 | INHALATION_SPRAY | Freq: Two times a day (BID) | RESPIRATORY_TRACT | 11 refills | Status: DC

## 2021-06-16 NOTE — Telephone Encounter (Signed)
Spoke with the pt  She is asking for Bevespi refill to be faxed to [facility]- [phone] Rx printed, signed and faxed  Nothing further needed

## 2021-06-26 NOTE — Progress Notes (Signed)
CARDIOLOGY CONSULT NOTE       Patient ID: Janet Giles MRN: [id number] DOB/AGE: May 02, 1941 81 y.o.   HPI:  81 y.o. f/u for aortic valve disease Last echo done 05/08/19 with tri leaflet AV mean gradient Only 7 mmHg normal EF and mild MR She has also had serial abdominal US for AAA but most recent 04/27/19 only 2.7 cm unchanged from 2018 She has history of hypothyroidism As well as asthma and neuropathy in her feet takes Gabapentin and sees Dr Jannifer Franklin in neurology   She has had exertional dyspnea worse last few months. Diagnosed with asthma Has not had PFTls in years No history of DVT/ PE or connective tissue disease   CT done 03/27/20 sowed RUL bronchiectasis with tree bud nodularity along with ground glass attenuation in left lung base Seen by [facility] Dr Carlis Abbott 11/22/19 improved on Bevespi and using Albuterol less She had bronchoscopy 08/07/19 with negative cultures  Dr Carlis Abbott felt a lot of the changes from mucoid impaction rather than active infection   Biggest issue is peripheral neuropathy in feet Back pain Skin Lupus with positive ANA And arthritis in hands and feet She has seen neurology and orthopedics Seems mad that She got taken off narcotic pain meds last July   Updated TTE 05/27/21 reviewed EF 60-65% mild AS mean gradient 9 peak 16 mmHg DVI 0.42   ROS All other systems reviewed and negative except as noted above  Past Medical History:  Diagnosis Date   AAA (abdominal aortic aneurysm)    ANA positive 08/06/2016   Aortic ectasia, abdominal ([place]) 05/26/2013   Asthma    Complication of anesthesia    trouble breathing after back surgery   COPD (chronic obstructive pulmonary disease) ([place])    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 Carloyn Manner   Degenerative joint disease of spine    buttocks pain -remains a problem   Family history of adverse reaction to anesthesia    one sister nasea and vomiting   Former smoker 08/13/2016   GERD (gastroesophageal  reflux disease)    occ   High risk medication use 08/06/2016   History of total hip replacement, right 08/13/2016   Hyperlipidemia    Hypothyroidism    Idiopathic peripheral neuropathy 08/13/2016   Lupus (HCC)    Dr. 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 08/06/2016   Shortness of breath dyspnea    uses inhaler occ   Systemic lupus erythematosus ([place])     Family History  Problem Relation Age of Onset   Diabetes Mother    Heart disease Mother    Heart attack Mother    Bleeding Disorder Father    Peptic Ulcer Father    Heart disease Sister    Heart attack Sister    Cancer Sister    Deep vein thrombosis Sister    Diabetes Sister    Hyperlipidemia Sister    Hypertension Sister    Varicose Veins Sister    AAA (abdominal aortic aneurysm) Sister    Bleeding Disorder Sister    Breast cancer Sister    Heart disease Brother    Heart attack Brother    Hypertension Brother     Social History   Socioeconomic History  Marital status: Married    Spouse name: Not on file   Number of children: 3   Years of education: Not on file   Highest education level: Some college, no degree  Occupational History   Occupation: Retired  Tobacco Use   Smoking status: Former    Packs/day: 1.50    Years: 30.00    Pack years: 45.00    Types: Cigarettes    Quit date: 05/11/2000    Years since quitting: 21.1   Smokeless tobacco: Never  Vaping Use   Vaping Use: Never used  Substance and Sexual Activity   Alcohol use: Never    Alcohol/week: 0.0 standard drinks   Drug use: Never   Sexual activity: Not on file  Other Topics Concern   Not on file  Social History Narrative   Right handed   2 cups of caffeine daily   Lives at  home with husband   Social Determinants of Health   Financial Resource Strain: Not on file  Food Insecurity: Not on file  Transportation Needs: Not on file  Physical Activity: Not on file  Stress: Not on file  Social Connections: Not on file  Intimate Partner Violence: Not on file    Past Surgical History:  Procedure Laterality Date   ABDOMINAL HYSTERECTOMY     BACK SURGERY  02   L4-L5- fusion   BRONCHIAL BRUSHINGS  08/07/2019   Procedure: BRONCHIAL BRUSHINGS;  Surgeon: Julian Hy, DO;  [facility];  Service: Endoscopy;;   BRONCHIAL WASHINGS  08/07/2019   Procedure: BRONCHIAL WASHINGS;  Surgeon: Julian Hy, DO;  [facility];  Service: Endoscopy;;   COLONOSCOPY WITH PROPOFOL N/A 03/17/2016   Procedure: COLONOSCOPY WITH PROPOFOL;  Surgeon: Garlan Fair, MD;  [facility];  Service: Endoscopy;  Laterality: N/A;   ESOPHAGOGASTRODUODENOSCOPY (EGD) WITH PROPOFOL N/A 03/17/2016   Procedure: ESOPHAGOGASTRODUODENOSCOPY (EGD) WITH PROPOFOL;  Surgeon: Garlan Fair, MD;  [facility];  Service: Endoscopy;  Laterality: N/A;   JOINT REPLACEMENT     TONSILLECTOMY AND ADENOIDECTOMY     TOTAL HIP ARTHROPLASTY Right 03/25/2015   Procedure: TOTAL HIP ARTHROPLASTY ANTERIOR APPROACH;  Surgeon: Frederik Pear, MD;  Location: [facility];  Service: Orthopedics;  Laterality: Right;   VIDEO BRONCHOSCOPY N/A 08/07/2019   Procedure: VIDEO BRONCHOSCOPY WITHOUT FLUORO;  Surgeon: Julian Hy, DO;  [facility];  Service: Endoscopy;  Laterality: N/A;      Current Outpatient Medications:    albuterol (PROVENTIL HFA;VENTOLIN HFA) 108 (90 BASE) MCG/ACT inhaler, Inhale 1 puff into the lungs every 6 (six) hours as needed for wheezing or shortness of breath., Disp: , Rfl:    ALPHA LIPOIC ACID PO, Take by mouth., Disp: , Rfl:    Biotin 5000 MCG CAPS, Take 5,000 mcg by mouth every evening., Disp: , Rfl:    Calcium Carb-Cholecalciferol (CALCIUM + D3 PO), Take 1  tablet by mouth daily. , Disp: , Rfl:    Ferrous Sulfate (IRON) 325 (65 Fe) MG TABS, Take 1 tablet by mouth., Disp: , Rfl:    gabapentin (NEURONTIN) 600 MG tablet, Take 1 tablet (600 mg total) by mouth 2 (two) times daily., Disp: 180 tablet, Rfl: 0   Glycopyrrolate-Formoterol (BEVESPI AEROSPHERE) 9-4.8 MCG/ACT AERO, Inhale 2 puffs into the lungs 2 (two) times daily., Disp: 10.7 g, Rfl: 11   levothyroxine (SYNTHROID, LEVOTHROID) 75 MCG tablet, Take 75 mcg by mouth at bedtime., Disp: , Rfl:    Multiple Vitamin (ONE-A-DAY ESSENTIAL PO), Take by  mouth., Disp: , Rfl:    vitamin B-12 (CYANOCOBALAMIN) 500 MCG tablet, Take 500 mcg by mouth every evening., Disp: , Rfl:     Physical Exam: Blood pressure 128/88, pulse 90, height 5\' 4"  (1.626 m), weight 143 lb (64.9 kg), SpO2 96 %.    Affect appropriate Healthy:  appears stated age 67: normal Neck supple with no adenopathy JVP normal no bruits no thyromegaly Lungs clear with no wheezing and good diaphragmatic motion Heart:  S1/S2 preserved mild AS murmur, no rub, gallop or click PMI normal Abdomen: benighn, BS positve, no tenderness, no AAA no bruit.  No HSM or HJR Distal pulses intact with no bruits No edema Neuro non-focal Skin warm and dry Post right THR   Labs:   Lab Results  Component Value Date   WBC 6.3 04/10/2021   HGB 10.6 (L) 04/10/2021   HCT 32.0 (L) 04/10/2021   MCV 96.1 04/10/2021   PLT 324 04/10/2021      Radiology: No results found.   EKG: SR rate 63 normal ECG 03/13/15 06/28/19 SR rate 80 normal 07/01/2021 NSR rate 90 Normal    ASSESSMENT AND PLAN:   1. Aortic Stenosis:  Mild by echo 05/27/21 mean gradient 9 mmHg observe  2. Neuropathy:  Continue gabapentin f/u Dr Jannifer Franklin Now using hydrocodone as well 3. AAA: not clear this needs yearly [place] f/u has diameter only 2.7 cm since 2018 f/u primary  4. Hypothyroidism :  On replacement labs with primary  5. Lipids:  F/u primary target LDL 70 or less 6. Dyspnea:  Not  related to heart f/u [facility] for bronchiectasis and chronic mucoid compaction Continue Bevespi and Albuterol as well as CT surveillance  F/U in a year with echo for AS   Time: spent reviewing echo, CT;s pulmonary notes direct patient interview and composing note 20 minutes    Signed: Jenkins Rouge 07/01/2021, 10:06 AM

## 2021-07-01 ENCOUNTER — Other Ambulatory Visit: Payer: Self-pay

## 2021-07-01 ENCOUNTER — Encounter: Payer: Self-pay | Admitting: Cardiovascular Disease

## 2021-07-01 ENCOUNTER — Ambulatory Visit (INDEPENDENT_AMBULATORY_CARE_PROVIDER_SITE_OTHER): Payer: Medicare Other | Admitting: Cardiovascular Disease

## 2021-07-01 VITALS — BP 128/88 | HR 90 | Ht 64.0 in | Wt 143.0 lb

## 2021-07-01 DIAGNOSIS — R0602 Shortness of breath: Secondary | ICD-10-CM

## 2021-07-01 DIAGNOSIS — I350 Nonrheumatic aortic (valve) stenosis: Secondary | ICD-10-CM

## 2021-07-01 DIAGNOSIS — E782 Mixed hyperlipidemia: Secondary | ICD-10-CM | POA: Diagnosis not present

## 2021-07-01 NOTE — Patient Instructions (Addendum)
Medication Instructions:  Your physician recommends that you continue on your current medications as directed. Please refer to the Current Medication list given to you today.  *If you need a refill on your cardiac medications before your next appointment, please call your pharmacy*  Lab Work: If you have labs (blood work) drawn today and your tests are completely normal, you will receive your results only by: [place] (if you have MyChart) OR A paper copy in the mail If you have any lab test that is abnormal or we need to change your treatment, we will call you to review the results.  Testing/Procedures: Your physician has requested that you have an echocardiogram in 1 year. Echocardiography is a painless test that uses sound waves to create images of your heart. It provides your doctor with information about the size and shape of your heart and how well your hearts chambers and valves are working. This procedure takes approximately one hour. There are no restrictions for this procedure.   Follow-Up: At [facility], you and your health needs are our priority.  As part of our continuing mission to provide you with exceptional heart care, we have created designated Provider Care Teams.  These Care Teams include your primary Cardiologist (physician) and Advanced Practice Providers (APPs -  Physician Assistants and Nurse Practitioners) who all work together to provide you with the care you need, when you need it.  We recommend signing up for the patient portal called "MyChart".  Sign up information is provided on this After Visit Summary.  MyChart is used to connect with patients for Virtual Visits (Telemedicine).  Patients are able to view lab/test results, encounter notes, upcoming appointments, etc.  Non-urgent messages can be sent to your provider as well.   To learn more about what you can do with MyChart, go to [email or web address].    Your next appointment:   12 month(s)  The  format for your next appointment:   In Person  Provider:   Jenkins Rouge, MD {

## 2021-07-15 DIAGNOSIS — Z79891 Long term (current) use of opiate analgesic: Secondary | ICD-10-CM | POA: Diagnosis not present

## 2021-07-15 DIAGNOSIS — M461 Sacroiliitis, not elsewhere classified: Secondary | ICD-10-CM | POA: Diagnosis not present

## 2021-07-15 DIAGNOSIS — G894 Chronic pain syndrome: Secondary | ICD-10-CM | POA: Diagnosis not present

## 2021-07-15 DIAGNOSIS — M47816 Spondylosis without myelopathy or radiculopathy, lumbar region: Secondary | ICD-10-CM | POA: Diagnosis not present

## 2021-07-15 DIAGNOSIS — G4700 Insomnia, unspecified: Secondary | ICD-10-CM | POA: Diagnosis not present

## 2021-08-12 DIAGNOSIS — M461 Sacroiliitis, not elsewhere classified: Secondary | ICD-10-CM | POA: Diagnosis not present

## 2021-08-12 DIAGNOSIS — G4700 Insomnia, unspecified: Secondary | ICD-10-CM | POA: Diagnosis not present

## 2021-08-12 DIAGNOSIS — M47816 Spondylosis without myelopathy or radiculopathy, lumbar region: Secondary | ICD-10-CM | POA: Diagnosis not present

## 2021-08-12 DIAGNOSIS — G894 Chronic pain syndrome: Secondary | ICD-10-CM | POA: Diagnosis not present

## 2021-09-09 DIAGNOSIS — G894 Chronic pain syndrome: Secondary | ICD-10-CM | POA: Diagnosis not present

## 2021-09-09 DIAGNOSIS — M461 Sacroiliitis, not elsewhere classified: Secondary | ICD-10-CM | POA: Diagnosis not present

## 2021-09-09 DIAGNOSIS — G4700 Insomnia, unspecified: Secondary | ICD-10-CM | POA: Diagnosis not present

## 2021-09-09 DIAGNOSIS — M47816 Spondylosis without myelopathy or radiculopathy, lumbar region: Secondary | ICD-10-CM | POA: Diagnosis not present

## 2021-09-24 ENCOUNTER — Ambulatory Visit (HOSPITAL_COMMUNITY)
Admission: RE | Admit: 2021-09-24 | Discharge: 2021-09-24 | Disposition: A | Discharge status: Home / Self Care | Payer: Medicare Other | Source: Ambulatory Visit | Attending: Pulmonary Disease | Admitting: Pulmonary Disease

## 2021-09-24 DIAGNOSIS — J479 Bronchiectasis, uncomplicated: Secondary | ICD-10-CM | POA: Diagnosis not present

## 2021-09-24 DIAGNOSIS — R918 Other nonspecific abnormal finding of lung field: Secondary | ICD-10-CM | POA: Diagnosis not present

## 2021-09-24 DIAGNOSIS — I700 Atherosclerosis of aorta: Secondary | ICD-10-CM | POA: Diagnosis not present

## 2021-10-07 ENCOUNTER — Other Ambulatory Visit: Payer: Self-pay | Admitting: Internal Medicine

## 2021-10-07 DIAGNOSIS — Z1231 Encounter for screening mammogram for malignant neoplasm of breast: Secondary | ICD-10-CM

## 2021-10-09 ENCOUNTER — Ambulatory Visit (INDEPENDENT_AMBULATORY_CARE_PROVIDER_SITE_OTHER): Payer: Medicare Other | Admitting: Pulmonary Disease

## 2021-10-09 ENCOUNTER — Encounter: Payer: Self-pay | Admitting: Pulmonary Disease

## 2021-10-09 VITALS — BP 128/72 | HR 70 | Ht 64.0 in | Wt 141.8 lb

## 2021-10-09 DIAGNOSIS — J449 Chronic obstructive pulmonary disease, unspecified: Secondary | ICD-10-CM

## 2021-10-09 DIAGNOSIS — K219 Gastro-esophageal reflux disease without esophagitis: Secondary | ICD-10-CM

## 2021-10-09 MED ORDER — FAMOTIDINE 20 MG PO TABS: 20.0000 mg | ORAL_TABLET | Freq: Every day | ORAL | 0 refills | Status: DC

## 2021-10-09 MED ORDER — ALBUTEROL SULFATE HFA 108 (90 BASE) MCG/ACT IN AERS: 1.0000 | INHALATION_SPRAY | Freq: Four times a day (QID) | RESPIRATORY_TRACT | 6 refills | Status: DC | PRN

## 2021-10-09 NOTE — Progress Notes (Signed)
Synopsis: Referred in March 2021 for shortness of breath by Alroy Dust, L.Marlou Sa, MD. Former patient of Dr. Carlis Abbott.  Subjective:   PATIENT ID: Janet Olive GENDER: female DOB: [birth year], MRN: [id number]   HPI  Chief Complaint  Patient presents with   Follow-up    [id number] and to discuss CT scan results from May 2023. States the [place] for her.     Janet Whiteakeris an 81year old woman, former smoker with COPD (FEV1 1.14L, 57%) and pulmonary nodule who returns to pulmonary clinic for follow up.   She continues to do well with Bevespi 2 puffs twice daily. She rarely needs albuterol. She reports increased symptoms of GERD.   CT Chest 09/24/21 show stable tree in bud nodularity of the RUL, no other new suspicious nodules and chronic areas of mild peripheral interstitial thickening.   Initial OV 10/22/20 She was previously followed by Dr. CCarlis Abbott She is on Bevespi twice daily for her COPD and rarely uses albuterol. Her breathing remains overall well since starting the bevespi.   She had a follow-up CT chest scan on 09/23/20 which showed stable tree-in-bud nodularity of the RUL from previous scan 05/28/19. No new suspicious nodules and resolution of the LLL groundglass opacity.   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 DAura Fey  Neuromuscular disorder ([facility]    neuropathy in feet   PONV (postoperative nausea and vomiting)    Primary osteoarthritis of both feet 08/06/2016   Primary osteoarthritis of both hands 08/06/2016   Primary osteoarthritis of right hip 03/24/2015   Raynaud's disease without gangrene 08/06/2016   Shortness of breath dyspnea    uses inhaler occ   Systemic lupus erythematosus ([place]      Family History  Problem Relation Age of Onset   Diabetes Mother    Heart disease Mother    Heart attack Mother    Bleeding Disorder Father    Peptic Ulcer Father    Heart disease Sister    Heart attack Sister    Cancer Sister    Deep vein thrombosis Sister    Diabetes Sister    Hyperlipidemia Sister    Hypertension Sister    Varicose Veins Sister    AAA (abdominal aortic aneurysm) Sister    Bleeding Disorder Sister    Breast cancer Sister    Heart disease Brother    Heart attack Brother    Hypertension Brother      Social History   Socioeconomic History   Marital status: Married    Spouse name: Not on file   Number of children: 3   Years of education: Not on  file   Highest education level: Some college, no degree  Occupational History   Occupation: Retired  Tobacco Use   Smoking status: Former    Packs/day: 1.50    Years: 30.00    Pack years: 45.00    Types: Cigarettes    Quit date: 05/11/2000    Years since quitting: 21.4   Smokeless tobacco: Never  Vaping Use   Vaping Use: Never used  Substance and Sexual Activity   Alcohol use: Never    Alcohol/week: 0.0 standard drinks   Drug use: Never   Sexual activity: Not on file  Other Topics Concern   Not on file  Social History Narrative   Right handed   2 cups of caffeine daily   Lives at home with husband   Social Determinants of Health   Financial Resource Strain: Not on file  Food Insecurity: Not on file   Transportation Needs: Not on file  Physical Activity: Not on file  Stress: Not on file  Social Connections: Not on file  Intimate Partner Violence: Not on file     Allergies  Allergen Reactions   Bee Venom Anaphylaxis   Wasp Venom Shortness Of Breath and Swelling   Doxycycline Nausea And Vomiting   Lovastatin     Other reaction(s): muscle aches   Naproxen     Other reaction(s): nausea     Outpatient Medications Prior to Visit  Medication Sig Dispense Refill   ALPHA LIPOIC ACID PO Take by mouth.     Biotin 5000 MCG CAPS Take 5,000 mcg by mouth every evening.     Calcium Carb-Cholecalciferol (CALCIUM + D3 PO) Take 1 tablet by mouth daily.      Ferrous Sulfate (IRON) 325 (65 Fe) MG TABS Take 1 tablet by mouth.     gabapentin (NEURONTIN) 600 MG tablet Take 1 tablet (600 mg total) by mouth 2 (two) times daily. 180 tablet 0   Glycopyrrolate-Formoterol (BEVESPI AEROSPHERE) 9-4.8 MCG/ACT AERO Inhale 2 puffs into the lungs 2 (two) times daily. 10.7 Giles 11   HYDROcodone-acetaminophen (NORCO/VICODIN) 5-325 MG tablet Take 1 tablet by mouth 2 (two) times daily as needed.     levothyroxine (SYNTHROID, LEVOTHROID) 75 MCG tablet Take 75 mcg by mouth at bedtime.     Multiple Vitamin (ONE-A-DAY ESSENTIAL PO) Take by mouth.     vitamin B-12 (CYANOCOBALAMIN) 500 MCG tablet Take 500 mcg by mouth every evening.     albuterol (PROVENTIL HFA;VENTOLIN HFA) 108 (90 BASE) MCG/ACT inhaler Inhale 1 puff into the lungs every 6 (six) hours as needed for wheezing or shortness of breath.     No facility-administered medications prior to visit.    Review of Systems  Constitutional:  Negative for chills, fever, malaise/fatigue and weight loss.  HENT:  Negative for congestion, sinus pain and sore throat.   Eyes: Negative.   Respiratory:  Negative for cough, hemoptysis, sputum production, shortness of breath and wheezing.   Cardiovascular:  Negative for chest pain, palpitations, orthopnea, claudication and leg  swelling.  Gastrointestinal:  Positive for heartburn. Negative for abdominal pain, nausea and vomiting.  Genitourinary: Negative.   Musculoskeletal:  Negative for joint pain and myalgias.  Skin:  Negative for rash.  Neurological:  Negative for weakness.  Endo/Heme/Allergies: Negative.   Psychiatric/Behavioral: Negative.       Objective:   Vitals:   10/09/21 1054  BP: 128/72  Pulse: 70  SpO2: 97%  Weight: 141 lb 12.8 oz (64.3 kg)  Height: _0  (1.626 m)  Physical Exam Constitutional:      General: She is not in acute distress.    Appearance: She is not ill-appearing.  HENT:     Head: Normocephalic and atraumatic.  Eyes:     General: No scleral icterus.    Conjunctiva/sclera: Conjunctivae normal.     Pupils: Pupils are equal, round, and reactive to light.  Cardiovascular:     Rate and Rhythm: Normal rate and regular rhythm.     Pulses: Normal pulses.     Heart sounds: Normal heart sounds. No murmur heard. Pulmonary:     Effort: Pulmonary effort is normal.     Breath sounds: Normal breath sounds. No wheezing, rhonchi or rales.  Abdominal:     General: Bowel sounds are normal.     Palpations: Abdomen is soft.  Musculoskeletal:     Right lower leg: No edema.     Left lower leg: No edema.  Lymphadenopathy:     Cervical: No cervical adenopathy.  Skin:    General: Skin is warm and dry.  Neurological:     General: No focal deficit present.     Mental Status: She is alert.  Psychiatric:        Mood and Affect: Mood normal.        Behavior: Behavior normal.        Thought Content: Thought content normal.        Judgment: Judgment normal.    CBC    Component Value Date/Time   WBC 6.3 04/10/2021 1142   RBC 3.33 (L) 04/10/2021 1142   HGB 10.6 (L) 04/10/2021 1142   HCT 32.0 (L) 04/10/2021 1142   PLT 324 04/10/2021 1142   MCV 96.1 04/10/2021 1142   MCH 31.8 04/10/2021 1142   MCHC 33.1 04/10/2021 1142   RDW 12.6 04/10/2021 1142   LYMPHSABS 1,613 04/10/2021  1142   MONOABS 0.4 07/21/2019 1127   EOSABS 139 04/10/2021 1142   BASOSABS 32 04/10/2021 1142      Latest Ref Rng & Units 04/10/2021   11:42 AM 10/10/2019   11:18 AM 07/21/2019   11:27 AM  BMP  Glucose 65 - 99 mg/dL 140   75   87    BUN 7 - 25 mg/dL _0 Creatinine 0.60 - 0.95 mg/dL 0.87   0.83   0.77    BUN/Creat Ratio 6 - 22 (calc) NOT APPLICABLE   NOT APPLICABLE     Sodium 443 - 146 mmol/L 141   138   135    Potassium 3.5 - 5.3 mmol/L 4.4   4.8   5.0    Chloride 98 - 110 mmol/L 105   105   103    CO2 20 - 32 mmol/L _1 Calcium 8.6 - 10.4 mg/dL 9.5   9.7   9.9      Chest imaging: CT Chest 09/24/21 1. No acute findings. 2. Tree-in-bud type areas of nodularity, most evident in the right upper lobe, are stable, benign, consistent with a chronic inflammatory etiology. 3. No suspicious lung nodules. 4. Chronic areas of mild peripheral interstitial thickening. 5. Aortic atherosclerosis and coronary artery calcifications.  CT Chest 09/23/2020 Stable tree-in-bud branching nodularity in the RIGHT upper lobe consistent with chronic infectious or inflammatory process. Resolution of ground-glass opacities in the LEFT lower lobe.  CT chest 07/05/2019 mild bronchiectasis in all lobes, right upper lobe peripheral tree-in-bud opacities, associated micronodules.  No significant nodularity in RML, minimal distal inferior lingular nodules. Linear scarring bilateral lower lobes.  Borderline mediastinal adenopathy.   CT chest 09/25/2019 Apical irregular nodules and tree-in-bud opacities.  Emphysema and airway thickening.  Pleural thickening of major fissure on the left.  All nodules shrinking other than nodule in azygo-esophageal recess, which is stable.  Senescent vascular calcification.  PFT:    Latest Ref Rng & Units 09/04/2019   11:52 AM 01/09/2014    3:34 PM  PFT Results  FVC-Pre L 1.72   2.01    FVC-Predicted Pre % 64   69    FVC-Post L 2.06   2.33    FVC-Predicted  Post % 77   81    Pre FEV1/FVC % % 60   63    Post FEV1/FCV % % 55   60    FEV1-Pre L 1.03   1.26    FEV1-Predicted Pre % 52   58    FEV1-Post L 1.14   1.39    DLCO uncorrected ml/min/mmHg 12.20   11.79    DLCO UNC% % 64   48    DLCO corrected ml/min/mmHg 13.65     DLCO COR %Predicted % 72     DLVA Predicted % 93   69    TLC L 4.90   4.45    TLC % Predicted % 96   88    RV % Predicted % 134   105    PFT 2021: moderately severe obstructive defect with mild diffusion defect   Labs: Micro: 08/07/2019 BAL fungus-penicillium species 08/07/2019 BAL AFB-negative 08/07/2019 BAL respiratory-negative 08/07/2019 BAL fungus lingula-negative 08/07/2019 BAL AFB lingula-negative 08/07/2019 BAL respiratory-negative  Echocardiogram 10/06/2018: LVEF 55 to 18%, grade 1 diastolic dysfunction with elevated LVEDP.  Normal LA, RV, RA.  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  Mild MR.    Assessment & Plan:   Chronic obstructive pulmonary disease, unspecified COPD type (Janet Giles) - Plan: albuterol (VENTOLIN HFA) 108 (90 Base) MCG/ACT inhaler  Gastroesophageal reflux disease without esophagitis - Plan: famotidine (PEPCID) 20 MG tablet  Discussion: Ruberta Holck is an 81 year old woman, former smoker with COPD (FEV1 1.14L, 57%) and pulmonary nodule who returns to pulmonary clinic for follow up.   She is to continue bevespi twice daily and as needed albuterol for her COPD which is stable at this time.   She is to start famotidine 64m daily for GERD symptoms.  Her RUL tree-in-bud opacities remain stable over the past year. No further need to chest CT monitoring.  JFreda Jackson MD [facility] Office: [phone]    Current Outpatient Medications:    ALPHA LIPOIC ACID PO, Take by mouth., Disp: , Rfl:    Biotin 5000 MCG CAPS, Take 5,000 mcg by mouth every evening., Disp: , Rfl:    Calcium Carb-Cholecalciferol (CALCIUM + D3 PO), Take 1  tablet by mouth daily. , Disp: , Rfl:    famotidine (PEPCID) 20 MG tablet, Take 1 tablet (20 mg total) by mouth daily., Disp: 30 tablet, Rfl: 0   Ferrous Sulfate (IRON) 325 (65 Fe) MG TABS, Take 1 tablet by mouth., Disp: , Rfl:    gabapentin (NEURONTIN) 600 MG tablet, Take 1 tablet (600 mg total) by mouth 2 (two) times daily., Disp: 180 tablet, Rfl: 0   Glycopyrrolate-Formoterol (BEVESPI AEROSPHERE) 9-4.8 MCG/ACT AERO, Inhale 2 puffs into the lungs 2 (two) times daily., Disp: 10.7 Giles, Rfl: 11   HYDROcodone-acetaminophen (  NORCO/VICODIN) 5-325 MG tablet, Take 1 tablet by mouth 2 (two) times daily as needed., Disp: , Rfl:    levothyroxine (SYNTHROID, LEVOTHROID) 75 MCG tablet, Take 75 mcg by mouth at bedtime., Disp: , Rfl:    Multiple Vitamin (ONE-A-DAY ESSENTIAL PO), Take by mouth., Disp: , Rfl:    vitamin B-12 (CYANOCOBALAMIN) 500 MCG tablet, Take 500 mcg by mouth every evening., Disp: , Rfl:    albuterol (VENTOLIN HFA) 108 (90 Base) MCG/ACT inhaler, Inhale 1 puff into the lungs every 6 (six) hours as needed for wheezing or shortness of breath., Disp: 6.7 Giles, Rfl: 6

## 2021-10-09 NOTE — Patient Instructions (Addendum)
Continue bevespi inhaler 2 puffs twice daily  Continue albuterol inhaler as needed  Start famotidine '20mg'$  daily for reflux disease  Follow up in 1 year

## 2021-10-13 ENCOUNTER — Encounter: Payer: Self-pay | Admitting: Pulmonary Disease

## 2021-10-22 DIAGNOSIS — I7300 Raynaud's syndrome without gangrene: Secondary | ICD-10-CM | POA: Diagnosis not present

## 2021-10-22 DIAGNOSIS — M8580 Other specified disorders of bone density and structure, unspecified site: Secondary | ICD-10-CM | POA: Diagnosis not present

## 2021-10-22 DIAGNOSIS — E039 Hypothyroidism, unspecified: Secondary | ICD-10-CM | POA: Diagnosis not present

## 2021-10-22 DIAGNOSIS — M85852 Other specified disorders of bone density and structure, left thigh: Secondary | ICD-10-CM | POA: Diagnosis not present

## 2021-10-22 DIAGNOSIS — G629 Polyneuropathy, unspecified: Secondary | ICD-10-CM | POA: Diagnosis not present

## 2021-10-22 DIAGNOSIS — Z0000 Encounter for general adult medical examination without abnormal findings: Secondary | ICD-10-CM | POA: Diagnosis not present

## 2021-10-22 DIAGNOSIS — M19042 Primary osteoarthritis, left hand: Secondary | ICD-10-CM | POA: Diagnosis not present

## 2021-10-22 DIAGNOSIS — M545 Low back pain, unspecified: Secondary | ICD-10-CM | POA: Diagnosis not present

## 2021-10-22 DIAGNOSIS — J449 Chronic obstructive pulmonary disease, unspecified: Secondary | ICD-10-CM | POA: Diagnosis not present

## 2021-10-22 DIAGNOSIS — M19041 Primary osteoarthritis, right hand: Secondary | ICD-10-CM | POA: Diagnosis not present

## 2021-10-22 DIAGNOSIS — I350 Nonrheumatic aortic (valve) stenosis: Secondary | ICD-10-CM | POA: Diagnosis not present

## 2021-10-22 DIAGNOSIS — E7800 Pure hypercholesterolemia, unspecified: Secondary | ICD-10-CM | POA: Diagnosis not present

## 2021-10-22 DIAGNOSIS — D649 Anemia, unspecified: Secondary | ICD-10-CM | POA: Diagnosis not present

## 2021-10-23 ENCOUNTER — Ambulatory Visit
Admission: RE | Admit: 2021-10-23 | Discharge: 2021-10-23 | Disposition: A | Discharge status: Home / Self Care | Payer: Medicare Other | Source: Ambulatory Visit | Attending: Internal Medicine | Admitting: Internal Medicine

## 2021-10-23 DIAGNOSIS — G894 Chronic pain syndrome: Secondary | ICD-10-CM | POA: Diagnosis not present

## 2021-10-23 DIAGNOSIS — M47816 Spondylosis without myelopathy or radiculopathy, lumbar region: Secondary | ICD-10-CM | POA: Diagnosis not present

## 2021-10-23 DIAGNOSIS — Z1231 Encounter for screening mammogram for malignant neoplasm of breast: Secondary | ICD-10-CM

## 2021-10-23 DIAGNOSIS — G4700 Insomnia, unspecified: Secondary | ICD-10-CM | POA: Diagnosis not present

## 2021-10-23 DIAGNOSIS — M461 Sacroiliitis, not elsewhere classified: Secondary | ICD-10-CM | POA: Diagnosis not present

## 2021-10-27 DIAGNOSIS — L578 Other skin changes due to chronic exposure to nonionizing radiation: Secondary | ICD-10-CM | POA: Diagnosis not present

## 2021-10-27 DIAGNOSIS — L570 Actinic keratosis: Secondary | ICD-10-CM | POA: Diagnosis not present

## 2021-10-27 DIAGNOSIS — L814 Other melanin hyperpigmentation: Secondary | ICD-10-CM | POA: Diagnosis not present

## 2021-10-27 DIAGNOSIS — L720 Epidermal cyst: Secondary | ICD-10-CM | POA: Diagnosis not present

## 2021-10-27 DIAGNOSIS — L821 Other seborrheic keratosis: Secondary | ICD-10-CM | POA: Diagnosis not present

## 2021-10-27 DIAGNOSIS — L820 Inflamed seborrheic keratosis: Secondary | ICD-10-CM | POA: Diagnosis not present

## 2021-10-27 DIAGNOSIS — D2271 Melanocytic nevi of right lower limb, including hip: Secondary | ICD-10-CM | POA: Diagnosis not present

## 2021-11-20 DIAGNOSIS — G894 Chronic pain syndrome: Secondary | ICD-10-CM | POA: Diagnosis not present

## 2021-11-20 DIAGNOSIS — M461 Sacroiliitis, not elsewhere classified: Secondary | ICD-10-CM | POA: Diagnosis not present

## 2021-11-20 DIAGNOSIS — G4700 Insomnia, unspecified: Secondary | ICD-10-CM | POA: Diagnosis not present

## 2021-11-20 DIAGNOSIS — M47816 Spondylosis without myelopathy or radiculopathy, lumbar region: Secondary | ICD-10-CM | POA: Diagnosis not present

## 2022-01-01 DIAGNOSIS — R300 Dysuria: Secondary | ICD-10-CM | POA: Diagnosis not present

## 2022-01-21 DIAGNOSIS — M47816 Spondylosis without myelopathy or radiculopathy, lumbar region: Secondary | ICD-10-CM | POA: Diagnosis not present

## 2022-01-21 DIAGNOSIS — G4700 Insomnia, unspecified: Secondary | ICD-10-CM | POA: Diagnosis not present

## 2022-01-21 DIAGNOSIS — G894 Chronic pain syndrome: Secondary | ICD-10-CM | POA: Diagnosis not present

## 2022-01-21 DIAGNOSIS — M7061 Trochanteric bursitis, right hip: Secondary | ICD-10-CM | POA: Diagnosis not present

## 2022-01-21 DIAGNOSIS — M461 Sacroiliitis, not elsewhere classified: Secondary | ICD-10-CM | POA: Diagnosis not present

## 2022-03-18 DIAGNOSIS — G4700 Insomnia, unspecified: Secondary | ICD-10-CM | POA: Diagnosis not present

## 2022-03-18 DIAGNOSIS — M461 Sacroiliitis, not elsewhere classified: Secondary | ICD-10-CM | POA: Diagnosis not present

## 2022-03-18 DIAGNOSIS — M47816 Spondylosis without myelopathy or radiculopathy, lumbar region: Secondary | ICD-10-CM | POA: Diagnosis not present

## 2022-03-18 DIAGNOSIS — G894 Chronic pain syndrome: Secondary | ICD-10-CM | POA: Diagnosis not present

## 2022-03-30 DIAGNOSIS — H6123 Impacted cerumen, bilateral: Secondary | ICD-10-CM | POA: Insufficient documentation

## 2022-03-30 DIAGNOSIS — H6121 Impacted cerumen, right ear: Secondary | ICD-10-CM | POA: Insufficient documentation

## 2022-03-30 DIAGNOSIS — H93291 Other abnormal auditory perceptions, right ear: Secondary | ICD-10-CM | POA: Diagnosis not present

## 2022-03-30 DIAGNOSIS — H9011 Conductive hearing loss, unilateral, right ear, with unrestricted hearing on the contralateral side: Secondary | ICD-10-CM | POA: Insufficient documentation

## 2022-03-31 NOTE — Progress Notes (Unsigned)
Office Visit Note  Patient: Janet Giles             Date of Birth: 1941-01-15           MRN: [id number]             PCP: Alroy Dust, L.Marlou Sa, MD Referring: Alroy Dust, Carlean Jews.Marlou Sa, MD Visit Date: 04/14/2022 Occupation: _0 @  Subjective:  Increased joint pain   History of Present Illness: Janet Giles is a 81 y.o. female with history of autoimmune disease and osteoarthritis.  Patient is not currently taking any immunosuppressive agents.  Patient was last seen in the office on 04/10/2021.  She reports that since her last office visit she has been under the care of pain management Dr. Greta Doom.  She has been experiencing significant discomfort in her lower back.  She continues to take hydrocodone as needed for pain relief and remains on gabapentin as prescribed.  She has ongoing symptoms of neuropathy in both feet which contributes to her pain level on a daily basis.  She is also noticed some increased pain and stiffness in both hands due to ongoing osteoarthritis.  She has had more difficulty buttoning recently.  She denies any joint swelling.  She experiences intermittent symptoms of Raynaud's especially in her toes.  She notices facial flushing when she does not wear make-up.  She denies any significant hair loss.  She has ongoing sicca symptoms but denies any oral or nasal ulcerations.  She has been using Systane eyedrops for dry eyes.  She continues to see [facility] dermatology on a yearly basis for skin exams.  She has ongoing photosensitivity and tries to avoid direct sun exposure.  She denies any other new or worsening symptoms.      Activities of Daily Living:  Patient reports morning stiffness for 2 hours.   Patient Reports nocturnal pain.  Difficulty dressing/grooming: Denies Difficulty climbing stairs: Denies Difficulty getting out of chair: Reports Difficulty using hands for taps, buttons, cutlery, and/or writing: Reports  Review of Systems  Constitutional:   Negative for fatigue.  HENT:  Positive for mouth dryness. Negative for mouth sores and nose dryness.   Eyes:  Positive for dryness. Negative for pain and visual disturbance.  Respiratory:  Negative for cough, hemoptysis and difficulty breathing.   Cardiovascular:  Negative for chest pain, palpitations, hypertension and swelling in legs/feet.  Gastrointestinal:  Negative for blood in stool, constipation and diarrhea.  Endocrine: Negative for increased urination.  Genitourinary:  Negative for painful urination and involuntary urination.  Musculoskeletal:  Positive for joint pain, gait problem, joint pain, myalgias, muscle weakness, morning stiffness, muscle tenderness and myalgias. Negative for joint swelling.  Skin:  Positive for color change and sensitivity to sunlight. Negative for pallor, rash, hair loss, nodules/bumps, skin tightness and ulcers.  Allergic/Immunologic: Negative for susceptible to infections.  Neurological:  Negative for dizziness, numbness, headaches and weakness.  Hematological:  Negative for swollen glands.  Psychiatric/Behavioral:  Positive for sleep disturbance. Negative for depressed mood. The patient is not nervous/anxious.     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   Former smoker 08/13/2016   ANA positive 08/06/2016   Raynaud's disease without gangrene 08/06/2016   Primary osteoarthritis of both hands 08/06/2016   Primary  osteoarthritis of both feet 08/06/2016   High risk medication use 08/06/2016   Primary osteoarthritis of right hip 03/24/2015   Aortic ectasia, 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 Aura Fey   Neuromuscular disorder ([place])    neuropathy in feet   PONV (postoperative nausea and vomiting)    Primary osteoarthritis of both feet 08/06/2016   Primary osteoarthritis of both hands 08/06/2016   Primary osteoarthritis of right hip 03/24/2015   Raynaud's disease without gangrene 08/06/2016   Shortness of breath dyspnea    uses inhaler occ   Systemic lupus erythematosus ([place])     Family History  Problem Relation Age of Onset   Diabetes Mother    Heart disease Mother    Heart attack Mother    Bleeding Disorder Father    Peptic Ulcer Father    Heart disease Sister    Heart attack Sister    Cancer Sister    Deep vein thrombosis Sister    Diabetes Sister    Hyperlipidemia Sister    Hypertension Sister    Varicose Veins Sister    AAA (abdominal aortic aneurysm) Sister    Bleeding Disorder Sister    Breast cancer Sister    Heart disease Brother    Heart attack Brother    Hypertension Brother    Past Surgical  History:  Procedure Laterality Date   ABDOMINAL HYSTERECTOMY     BACK SURGERY  02   L4-L5- fusion   BRONCHIAL BRUSHINGS  08/07/2019   Procedure: BRONCHIAL BRUSHINGS;  Surgeon: Julian Hy, DO;  Location: [facility];  Service: Endoscopy;;   BRONCHIAL WASHINGS  08/07/2019   Procedure: BRONCHIAL WASHINGS;  Surgeon: Julian Hy, DO;  Location: [facility];  Service: Endoscopy;;   COLONOSCOPY WITH PROPOFOL N/A 03/17/2016   Procedure: COLONOSCOPY WITH PROPOFOL;  Surgeon: Garlan Fair, MD;  Location: [facility];  Service: Endoscopy;  Laterality: N/A;   ESOPHAGOGASTRODUODENOSCOPY (EGD) WITH PROPOFOL N/A 03/17/2016   Procedure: ESOPHAGOGASTRODUODENOSCOPY (EGD) WITH PROPOFOL;  Surgeon: Garlan Fair, MD;  Location: [facility];  Service: Endoscopy;  Laterality: N/A;   JOINT REPLACEMENT     TONSILLECTOMY AND ADENOIDECTOMY     TOTAL HIP ARTHROPLASTY Right 03/25/2015   Procedure: TOTAL HIP ARTHROPLASTY ANTERIOR APPROACH;  Surgeon: Frederik Pear, MD;  Location: [facility];  Service: Orthopedics;  Laterality: Right;   VIDEO BRONCHOSCOPY N/A 08/07/2019  Procedure: VIDEO BRONCHOSCOPY WITHOUT FLUORO;  Surgeon: Julian Hy, DO;  Location: [facility];  Service: Endoscopy;  Laterality: N/A;   Social History   Social History Narrative   Right handed   2 cups of caffeine daily   Lives at home with husband   Immunization History  Administered Date(s) Administered   Fluad Quad(high Dose 65+) 02/20/2019   PFIZER(Purple Top)SARS-COV-2 Vaccination 05/24/2019, 06/14/2019, 02/26/2020   Pneumococcal Conjugate-13 08/14/2014   Pneumococcal Polysaccharide-23 10/11/1998, 06/29/2008   Td 08/13/2016   Tdap 06/29/2005     Objective: Vital Signs: BP 110/65 (BP Location: Left Arm, Patient Position: Sitting, Cuff Size: Normal)   Pulse 72   Resp 16   Ht _0  (1.626 m)   Wt 136 lb (61.7 kg)   BMI 23.34 kg/m    Physical Exam Vitals and nursing note reviewed.  Constitutional:      Appearance: She  is well-developed.  HENT:     Head: Normocephalic and atraumatic.  Eyes:     Conjunctiva/sclera: Conjunctivae normal.  Cardiovascular:     Rate and Rhythm: Normal rate and regular rhythm.     Heart sounds: Normal heart sounds.  Pulmonary:     Effort: Pulmonary effort is normal.     Breath sounds: Normal breath sounds.  Abdominal:     General: Bowel sounds are normal.     Palpations: Abdomen is soft.  Musculoskeletal:     Cervical back: Normal range of motion.  Skin:    General: Skin is warm and dry.     Capillary Refill: Capillary refill takes less than 2 seconds.  Neurological:     Mental Status: She is alert and oriented to person, place, and time.  Psychiatric:        Behavior: Behavior normal.      Musculoskeletal Exam: C-spine has good range of motion.  Painful range of motion of the lumbar spine.  Tenderness in the lumbar region.  Shoulder joints, elbow joints, wrist joints, MCPs, PIPs, DIPs have good range of motion with no synovitis.  Severe CMC thickening and subluxation bilaterally.  PIP and DIP thickening consistent with osteoarthritis of both hands.  Right hip replacement has good range of motion.  Left hip joint has good range of motion with no discomfort.  Knee joints have good range of motion with no warmth or effusion.  Ankle joints have good range of motion with no tenderness or joint swelling.  CDAI Exam: CDAI Score: -- Patient Global: --; Provider Global: -- Swollen: --; Tender: -- Joint Exam 04/14/2022   No joint exam has been documented for this visit   There is currently no information documented on the homunculus. Go to the Rheumatology activity and complete the homunculus joint exam.  Investigation: No additional findings.  Imaging: No results found.  Recent Labs: Lab Results  Component Value Date   WBC 6.3 04/10/2021   HGB 10.6 (L) 04/10/2021   PLT 324 04/10/2021   NA 141 04/10/2021   K 4.4 04/10/2021   CL 105 04/10/2021   CO2 28  04/10/2021   GLUCOSE 140 (H) 04/10/2021   BUN 22 04/10/2021   CREATININE 0.87 04/10/2021   BILITOT 0.5 04/10/2021   ALKPHOS 48 07/21/2019   AST 19 04/10/2021   ALT 20 04/10/2021   PROT 6.5 04/10/2021   ALBUMIN 4.2 07/21/2019   CALCIUM 9.5 04/10/2021   GFRAA 78 10/10/2019    Speciality Comments: No specialty comments available.  Procedures:  No procedures performed Allergies: Bee venom, Wasp  venom, Doxycycline, Lovastatin, and Naproxen   Assessment / Plan:     Visit Diagnoses: Autoimmune disease ([place]) - +ANA 1:40, ds DNA negative Raynauds , history of skin lupus biopsy proven by Dr. Ubaldo Glassing per patient: She does not currently meet clinical criteria for systemic lupus.  She is not currently taking any immunosuppressive agents.  She discontinued Plaquenil in April 2018.  She has been experiencing increased pain especially in her lower back but had no active synovitis on examination today.  She experiences intermittent symptoms of Raynaud's phenomenon in her toes and continues to have chronic sicca symptoms.  No oral or nasal ulcers.  She has ongoing photosensitivity and continues to see [facility] dermatology on a yearly basis. No signs of alopecia or a malar rash was noted at this time.   Lab work from 04/10/2021 was reviewed today in the office: ESR within normal limits, complements within normal limits, double-stranded DNA negative.  The following lab work will be obtained today for further evaluation.  She will notify us if she develops any new or worsening symptoms.  Warning signs and symptoms for active autoimmune disease were discussed today in the detail.  She will follow up in 1 year or sooner if needed.   - Plan: Anti-DNA antibody, double-stranded, C3 and C4, Sedimentation rate, ANA, Sjogrens syndrome-A extractable nuclear antibody, Sjogrens syndrome-B extractable nuclear antibody, Anti-Smith antibody, CBC with Differential/Platelet, COMPLETE METABOLIC PANEL WITH GFR, Urinalysis, Routine w  reflex microscopic  High risk medication use - She has been off of Plaquenil since April 2018.  She is not currently taking any immunosuppressive agents.    Raynaud's disease without gangrene: She experiences intermittent symptoms of Raynaud's phenomenon in her feet.  No digital ulcerations noted.  Primary osteoarthritis of both hands: She has PIP and DIP aching and consistent with osteoarthritis of both hands.  CMC joint prominence and subluxation bilaterally.  No tenderness or synovitis noted.  Discussed the diagnosis of osteoarthritis and different treatment options today.  Discussed the importance of joint protection and muscle strengthening.  Patient was given a handout of hand exercises to perform.  I also offered referral to physical therapy in the future if her symptoms do not improve.  I also discussed the use of arthritis compression gloves or CMC joint braces as needed.  Trigger middle finger of right hand: Resolved.   History of total hip replacement, right - Dr. Mayer Camel.  Doing well.  Good ROM with no groin pain currently.   Primary osteoarthritis of both feet: Chronic pain secondary to neuropathy.  She remains on gabapentin as prescribed.   DDD (degenerative disc disease), lumbar - She had surgery in the past.  She will follow-up with Dr. Lynann Bologna.  Followed by pain management. She takes Norco for pain relief and remains on gabapentin as prescribed.  Chronic pain.    Osteopenia of multiple sites  Idiopathic peripheral neuropathy: She remains on gabapentin as prescribed.   Other medical conditions are listed as follows:   Aortic ectasia, abdominal (HCC)  Other fatigue  History of COPD -Followed by Dr. Erin Fulling.   Former smoker  Orders: Orders Placed This Encounter  Procedures   Anti-DNA antibody, double-stranded   C3 and C4   Sedimentation rate   ANA   Sjogrens syndrome-A extractable nuclear antibody   Sjogrens syndrome-B extractable nuclear antibody   Anti-Smith  antibody   CBC with Differential/Platelet   COMPLETE METABOLIC PANEL WITH GFR   Urinalysis, Routine w reflex microscopic   No orders of the defined  types were placed in this encounter.    Follow-Up Instructions: Return in about 1 year (around 04/15/2023).   Ofilia Neas, PA-C  Note - This record has been created using Dragon software.  Chart creation errors have been sought, but may not always  have been located. Such creation errors do not reflect on  the standard of medical care.

## 2022-04-14 ENCOUNTER — Ambulatory Visit: Payer: Medicare Other | Attending: Physician Assistant | Admitting: Physician Assistant

## 2022-04-14 ENCOUNTER — Encounter: Payer: Self-pay | Admitting: Physician Assistant

## 2022-04-14 VITALS — BP 110/65 | HR 72 | Resp 16 | Ht 64.0 in | Wt 136.0 lb

## 2022-04-14 DIAGNOSIS — M359 Systemic involvement of connective tissue, unspecified: Secondary | ICD-10-CM | POA: Diagnosis not present

## 2022-04-14 DIAGNOSIS — Z79899 Other long term (current) drug therapy: Secondary | ICD-10-CM | POA: Diagnosis not present

## 2022-04-14 DIAGNOSIS — G609 Hereditary and idiopathic neuropathy, unspecified: Secondary | ICD-10-CM | POA: Diagnosis not present

## 2022-04-14 DIAGNOSIS — M5136 Other intervertebral disc degeneration, lumbar region: Secondary | ICD-10-CM | POA: Insufficient documentation

## 2022-04-14 DIAGNOSIS — Z8709 Personal history of other diseases of the respiratory system: Secondary | ICD-10-CM

## 2022-04-14 DIAGNOSIS — M19041 Primary osteoarthritis, right hand: Secondary | ICD-10-CM | POA: Diagnosis not present

## 2022-04-14 DIAGNOSIS — M51369 Other intervertebral disc degeneration, lumbar region without mention of lumbar back pain or lower extremity pain: Secondary | ICD-10-CM

## 2022-04-14 DIAGNOSIS — R5383 Other fatigue: Secondary | ICD-10-CM | POA: Diagnosis not present

## 2022-04-14 DIAGNOSIS — Z87891 Personal history of nicotine dependence: Secondary | ICD-10-CM | POA: Diagnosis not present

## 2022-04-14 DIAGNOSIS — I77811 Abdominal aortic ectasia: Secondary | ICD-10-CM

## 2022-04-14 DIAGNOSIS — M19042 Primary osteoarthritis, left hand: Secondary | ICD-10-CM | POA: Diagnosis not present

## 2022-04-14 DIAGNOSIS — Z96641 Presence of right artificial hip joint: Secondary | ICD-10-CM | POA: Diagnosis not present

## 2022-04-14 DIAGNOSIS — M8589 Other specified disorders of bone density and structure, multiple sites: Secondary | ICD-10-CM

## 2022-04-14 DIAGNOSIS — M65331 Trigger finger, right middle finger: Secondary | ICD-10-CM | POA: Diagnosis not present

## 2022-04-14 DIAGNOSIS — M19072 Primary osteoarthritis, left ankle and foot: Secondary | ICD-10-CM | POA: Diagnosis not present

## 2022-04-14 DIAGNOSIS — M19071 Primary osteoarthritis, right ankle and foot: Secondary | ICD-10-CM | POA: Diagnosis not present

## 2022-04-14 DIAGNOSIS — I7300 Raynaud's syndrome without gangrene: Secondary | ICD-10-CM

## 2022-04-14 NOTE — Patient Instructions (Signed)
Hand Exercises Hand exercises can be helpful for almost anyone. These exercises can strengthen the hands, improve flexibility and movement, and increase blood flow to the hands. These results can make work and daily tasks easier. Hand exercises can be especially helpful for people who have joint pain from arthritis or have nerve damage from overuse (carpal tunnel syndrome). These exercises can also help people who have injured a hand. Exercises Most of these hand exercises are gentle stretching and motion exercises. It is usually safe to do them often throughout the day. Warming up your hands before exercise may help to reduce stiffness. You can do this with gentle massage or by placing your hands in warm water for 10-15 minutes. It is normal to feel some stretching, pulling, tightness, or mild discomfort as you begin new exercises. This will gradually improve. Stop an exercise right away if you feel sudden, severe pain or your pain gets worse. Ask your health care provider which exercises are best for you. Knuckle bend or "claw" fist  Stand or sit with your arm, hand, and all five fingers pointed straight up. Make sure to keep your wrist straight during the exercise. Gently bend your fingers down toward your palm until the tips of your fingers are touching the top of your palm. Keep your big knuckle straight and just bend the small knuckles in your fingers. Hold this position for __________ seconds. Straighten (extend) your fingers back to the starting position. Repeat this exercise 5-10 times with each hand. Full finger fist  Stand or sit with your arm, hand, and all five fingers pointed straight up. Make sure to keep your wrist straight during the exercise. Gently bend your fingers into your palm until the tips of your fingers are touching the middle of your palm. Hold this position for __________ seconds. Extend your fingers back to the starting position, stretching every joint fully. Repeat  this exercise 5-10 times with each hand. Straight fist Stand or sit with your arm, hand, and all five fingers pointed straight up. Make sure to keep your wrist straight during the exercise. Gently bend your fingers at the big knuckle, where your fingers meet your hand, and the middle knuckle. Keep the knuckle at the tips of your fingers straight and try to touch the bottom of your palm. Hold this position for __________ seconds. Extend your fingers back to the starting position, stretching every joint fully. Repeat this exercise 5-10 times with each hand. Tabletop  Stand or sit with your arm, hand, and all five fingers pointed straight up. Make sure to keep your wrist straight during the exercise. Gently bend your fingers at the big knuckle, where your fingers meet your hand, as far down as you can while keeping the small knuckles in your fingers straight. Think of forming a tabletop with your fingers. Hold this position for __________ seconds. Extend your fingers back to the starting position, stretching every joint fully. Repeat this exercise 5-10 times with each hand. Finger spread  Place your hand flat on a table with your palm facing down. Make sure your wrist stays straight as you do this exercise. Spread your fingers and thumb apart from each other as far as you can until you feel a gentle stretch. Hold this position for __________ seconds. Bring your fingers and thumb tight together again. Hold this position for __________ seconds. Repeat this exercise 5-10 times with each hand. Making circles  Stand or sit with your arm, hand, and all five fingers pointed   straight up. Make sure to keep your wrist straight during the exercise. Make a circle by touching the tip of your thumb to the tip of your index finger. Hold for __________ seconds. Then open your hand wide. Repeat this motion with your thumb and each finger on your hand. Repeat this exercise 5-10 times with each hand. Thumb  motion  Sit with your forearm resting on a table and your wrist straight. Your thumb should be facing up toward the ceiling. Keep your fingers relaxed as you move your thumb. Lift your thumb up as high as you can toward the ceiling. Hold for __________ seconds. Bend your thumb across your palm as far as you can, reaching the tip of your thumb for the small finger (pinkie) side of your palm. Hold for __________ seconds. Repeat this exercise 5-10 times with each hand. Grip strengthening  Hold a stress ball or other soft ball in the middle of your hand. Slowly increase the pressure, squeezing the ball as much as you can without causing pain. Think of bringing the tips of your fingers into the middle of your palm. All of your finger joints should bend when doing this exercise. Hold your squeeze for __________ seconds, then relax. Repeat this exercise 5-10 times with each hand. Contact a health care provider if: Your hand pain or discomfort gets much worse when you do an exercise. Your hand pain or discomfort does not improve within 2 hours after you exercise. If you have any of these problems, stop doing these exercises right away. Do not do them again unless your health care provider says that you can. Get help right away if: You develop sudden, severe hand pain or swelling. If this happens, stop doing these exercises right away. Do not do them again unless your health care provider says that you can. This information is not intended to replace advice given to you by your health care provider. Make sure you discuss any questions you have with your health care provider. Document Revised: 08/15/2020 Document Reviewed: 08/15/2020 Elsevier Patient Education  2023 [employer]  

## 2022-04-14 NOTE — Progress Notes (Signed)
Anemia stable.

## 2022-04-16 LAB — CBC WITH DIFFERENTIAL/PLATELET
Absolute Monocytes: 653 cells/uL (ref 200–950)
Basophils Absolute: 40 cells/uL (ref 0–200)
Basophils Relative: 0.6 %
Eosinophils Absolute: 211 cells/uL (ref 15–500)
Eosinophils Relative: 3.2 %
HCT: 30.3 % — ABNORMAL LOW (ref 35.0–45.0)
Hemoglobin: 10.4 g/dL — ABNORMAL LOW (ref 11.7–15.5)
Lymphs Abs: 1769 cells/uL (ref 850–3900)
MCH: 32.6 pg (ref 27.0–33.0)
MCHC: 34.3 g/dL (ref 32.0–36.0)
MCV: 95 fL (ref 80.0–100.0)
MPV: 8.9 fL (ref 7.5–12.5)
Monocytes Relative: 9.9 %
Neutro Abs: 3927 cells/uL (ref 1500–7800)
Neutrophils Relative %: 59.5 %
Platelets: 348 10*3/uL (ref 140–400)
RBC: 3.19 10*6/uL — ABNORMAL LOW (ref 3.80–5.10)
RDW: 12 % (ref 11.0–15.0)
Total Lymphocyte: 26.8 %
WBC: 6.6 10*3/uL (ref 3.8–10.8)

## 2022-04-16 LAB — URINALYSIS, ROUTINE W REFLEX MICROSCOPIC
Bilirubin Urine: NEGATIVE
Glucose, UA: NEGATIVE
Hgb urine dipstick: NEGATIVE
Hyaline Cast: NONE SEEN /LPF
Nitrite: NEGATIVE
Protein, ur: NEGATIVE
RBC / HPF: NONE SEEN /HPF (ref 0–2)
Specific Gravity, Urine: 1.023 (ref 1.001–1.035)
pH: 5.5 (ref 5.0–8.0)

## 2022-04-16 LAB — ANTI-NUCLEAR AB-TITER (ANA TITER): ANA Titer 1: 1:80 {titer} — ABNORMAL HIGH

## 2022-04-16 LAB — COMPLETE METABOLIC PANEL WITH GFR
AG Ratio: 1.8 (calc) (ref 1.0–2.5)
ALT: 12 U/L (ref 6–29)
AST: 15 U/L (ref 10–35)
Albumin: 4.5 g/dL (ref 3.6–5.1)
Alkaline phosphatase (APISO): 41 U/L (ref 37–153)
BUN/Creatinine Ratio: 30 (calc) — ABNORMAL HIGH (ref 6–22)
BUN: 28 mg/dL — ABNORMAL HIGH (ref 7–25)
CO2: 31 mmol/L (ref 20–32)
Calcium: 10.5 mg/dL — ABNORMAL HIGH (ref 8.6–10.4)
Chloride: 103 mmol/L (ref 98–110)
Creat: 0.92 mg/dL (ref 0.60–0.95)
Globulin: 2.5 g/dL (calc) (ref 1.9–3.7)
Glucose, Bld: 76 mg/dL (ref 65–99)
Potassium: 4.8 mmol/L (ref 3.5–5.3)
Sodium: 140 mmol/L (ref 135–146)
Total Bilirubin: 0.4 mg/dL (ref 0.2–1.2)
Total Protein: 7 g/dL (ref 6.1–8.1)
eGFR: 63 mL/min/{1.73_m2} (ref >=60)

## 2022-04-16 LAB — SJOGRENS SYNDROME-B EXTRACTABLE NUCLEAR ANTIBODY: SSB (La) (ENA) Antibody, IgG: <1 AI

## 2022-04-16 LAB — SJOGRENS SYNDROME-A EXTRACTABLE NUCLEAR ANTIBODY: SSA (Ro) (ENA) Antibody, IgG: <1 AI

## 2022-04-16 LAB — ANTI-DNA ANTIBODY, DOUBLE-STRANDED: ds DNA Ab: 5 IU/mL — ABNORMAL HIGH

## 2022-04-16 LAB — MICROSCOPIC MESSAGE

## 2022-04-16 LAB — ANA: Anti Nuclear Antibody (ANA): POSITIVE — AB

## 2022-04-16 LAB — C3 AND C4
C3 Complement: 132 mg/dL
C4 Complement: 15 mg/dL

## 2022-04-16 LAB — SEDIMENTATION RATE: Sed Rate: 38 mm/h — ABNORMAL HIGH (ref 0–30)

## 2022-04-16 LAB — ANTI-SMITH ANTIBODY: ENA SM Ab Ser-aCnc: <1 AI

## 2022-04-20 NOTE — Progress Notes (Signed)
ANA remains positive.  dsDNA is within the indeterminate range.   Complements WNL. ESR is elevated-could be seconday to anemia.  Calcium is borderline elevated.  Avoid Calcium supplementation at this time. Rest of CMP WNL.  Ro and La antibodies negative.  Smith negative.  UA 1+ leukocytes.  10-20 squamous cells, few bacteria.  Negative for nitrites. If she develops symptoms of a UTI please advise her to follow up with PCP

## 2022-04-22 DIAGNOSIS — M47816 Spondylosis without myelopathy or radiculopathy, lumbar region: Secondary | ICD-10-CM | POA: Diagnosis not present

## 2022-04-23 DIAGNOSIS — R350 Frequency of micturition: Secondary | ICD-10-CM | POA: Diagnosis not present

## 2022-04-23 DIAGNOSIS — J449 Chronic obstructive pulmonary disease, unspecified: Secondary | ICD-10-CM | POA: Diagnosis not present

## 2022-04-23 DIAGNOSIS — D649 Anemia, unspecified: Secondary | ICD-10-CM | POA: Diagnosis not present

## 2022-04-23 DIAGNOSIS — R0989 Other specified symptoms and signs involving the circulatory and respiratory systems: Secondary | ICD-10-CM | POA: Diagnosis not present

## 2022-04-23 DIAGNOSIS — G629 Polyneuropathy, unspecified: Secondary | ICD-10-CM | POA: Diagnosis not present

## 2022-04-23 DIAGNOSIS — E039 Hypothyroidism, unspecified: Secondary | ICD-10-CM | POA: Diagnosis not present

## 2022-04-23 DIAGNOSIS — E7800 Pure hypercholesterolemia, unspecified: Secondary | ICD-10-CM | POA: Diagnosis not present

## 2022-05-05 DIAGNOSIS — M47816 Spondylosis without myelopathy or radiculopathy, lumbar region: Secondary | ICD-10-CM | POA: Diagnosis not present

## 2022-05-13 DIAGNOSIS — M47816 Spondylosis without myelopathy or radiculopathy, lumbar region: Secondary | ICD-10-CM | POA: Diagnosis not present

## 2022-05-13 DIAGNOSIS — G894 Chronic pain syndrome: Secondary | ICD-10-CM | POA: Diagnosis not present

## 2022-05-13 DIAGNOSIS — G4700 Insomnia, unspecified: Secondary | ICD-10-CM | POA: Diagnosis not present

## 2022-05-13 DIAGNOSIS — M461 Sacroiliitis, not elsewhere classified: Secondary | ICD-10-CM | POA: Diagnosis not present

## 2022-06-03 ENCOUNTER — Ambulatory Visit (HOSPITAL_COMMUNITY): Payer: Medicare Other | Attending: Cardiovascular Disease

## 2022-06-03 DIAGNOSIS — I350 Nonrheumatic aortic (valve) stenosis: Secondary | ICD-10-CM | POA: Insufficient documentation

## 2022-06-03 LAB — ECHOCARDIOGRAM COMPLETE
AR max vel: 1.43 cm2
AV Area VTI: 1.48 cm2
AV Area mean vel: 1.36 cm2
AV Mean grad: 9 mmHg
AV Peak grad: 17 mmHg
Ao pk vel: 2.06 m/s
Area-P 1/2: 2.87 cm2
S' Lateral: 2.3 cm

## 2022-06-10 DIAGNOSIS — M47816 Spondylosis without myelopathy or radiculopathy, lumbar region: Secondary | ICD-10-CM | POA: Diagnosis not present

## 2022-06-17 NOTE — Progress Notes (Unsigned)
Office Visit    Patient Name: Janet Giles Date of Encounter: 06/18/2022  PCP:  Alroy Dust, L.Marlou Sa, [facility]  Cardiologist:  Jenkins Rouge, MD  Advanced Practice Provider:  No care team member to display Electrophysiologist:  None    HPI    Janet Giles is a 82 y.o. female with a past medical history of AAA, COPD, GERD, former smoker, hyperlipidemia, hypothyroidism, lupus presents today for follow-up visit.  Last echo done 05/07/2020 with trileaflet AV, mean gradient only 7 mmHg, normal EF, and mild MR.  Serial abdominal ultrasound for AAA but most recent 04/27/2019 only 2.7 cm, unchanged from 2018.  She has a history of hypothyroidism.  History of asthma and neuropathy in her feet, takes gabapentin and sees Dr. Jannifer Franklin in neurology.  She has had exertional dyspnea worse over the past few months.  Diagnosed with asthma.  Has not had PFTs in years.  No history of DVT/PE or connective tissue disease.  CT done 03/27/2020 showed RUL bronchiectasis with tree-in-bud nodularity along the groundglass attenuation in the left lung base.  Seen by [facility] pulmonology, Dr. Carlis Abbott 11/22/2019.  Improved on inhalers.  Bronchoscopy 08/07/2019 with negative cultures.  Dr. Carlis Abbott felt a lot of changes from mucoid impaction rather than active infection.  Biggest issue is peripheral neuropathy in feet, back pain skin.  Lupus with positive ANA.  Arthritis in the hands and feet.  She has seen neurology and orthopedics.  Seems upset that she was taken off narcotic meds last July.  Updated TTE 05/27/2021 reviewed EF 60 to 65%, mild AS mean gradient 9 peak 16 mmHg.  DVI 0.42.  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  Never had an issue before with fatigue but recently has been more fatigued over the past few weeks.  Her brother died in his 28s of a heart attack and "needing a heart replacement".  Her sister  had stents and valves put in.  Her other brother needed stents but passed away before they could be placed.  She also sees a lung doctor.  We discussed several options to workup her coronaries including coronary CTA, stress testing, and cardiac catheterization.  She had a contrast allergy when she had a CTA in the past (severe headache) and wishes not to have this done.  She would like to talk to her pulmonologist first before pursuing an ischemic workup.  She has always had issues with her back and has been dealing with that.  She has always had a low hemoglobin and takes iron supplementation.  She sees a pain management doctor as well.  She was on lupus medication at one point but could not tolerate it.  Reports no shortness of breath nor dyspnea on exertion. Reports no chest pain, pressure, or tightness. No edema, orthopnea, PND. Reports no palpitations.    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 Carloyn Manner   Degenerative joint disease of spine    buttocks pain -remains a problem   Family history of adverse reaction to anesthesia  one sister nasea and vomiting   Former smoker 08/13/2016   GERD (gastroesophageal reflux disease)    occ   High risk medication use 08/06/2016   History of total hip replacement, right 08/13/2016   Hyperlipidemia    Hypothyroidism    Idiopathic peripheral neuropathy 08/13/2016   Lupus ([place])    Dr. Aura Fey   Neuromuscular disorder [facility])    neuropathy in feet   PONV (postoperative nausea and vomiting)    Primary osteoarthritis of both feet 08/06/2016   Primary osteoarthritis of both hands 08/06/2016   Primary osteoarthritis of  right hip 03/24/2015   Raynaud's disease without gangrene 08/06/2016   Shortness of breath dyspnea    uses inhaler occ   Systemic lupus erythematosus ([place])    Past Surgical History:  Procedure Laterality Date   ABDOMINAL HYSTERECTOMY     BACK SURGERY  02   L4-L5- fusion   BRONCHIAL BRUSHINGS  08/07/2019   Procedure: BRONCHIAL BRUSHINGS;  Surgeon: Julian Hy, DO;  Location: [facility];  Service: Endoscopy;;   BRONCHIAL WASHINGS  08/07/2019   Procedure: BRONCHIAL WASHINGS;  Surgeon: Julian Hy, DO;  Location: [facility];  Service: Endoscopy;;   COLONOSCOPY WITH PROPOFOL N/A 03/17/2016   Procedure: COLONOSCOPY WITH PROPOFOL;  Surgeon: Garlan Fair, MD;  Location: [facility];  Service: Endoscopy;  Laterality: N/A;   ESOPHAGOGASTRODUODENOSCOPY (EGD) WITH PROPOFOL N/A 03/17/2016   Procedure: ESOPHAGOGASTRODUODENOSCOPY (EGD) WITH PROPOFOL;  Surgeon: Garlan Fair, MD;  Location: [facility];  Service: Endoscopy;  Laterality: N/A;   JOINT REPLACEMENT     TONSILLECTOMY AND ADENOIDECTOMY     TOTAL HIP ARTHROPLASTY Right 03/25/2015   Procedure: TOTAL HIP ARTHROPLASTY ANTERIOR APPROACH;  Surgeon: Frederik Pear, MD;  Location: [facility];  Service: Orthopedics;  Laterality: Right;   VIDEO BRONCHOSCOPY N/A 08/07/2019   Procedure: VIDEO BRONCHOSCOPY WITHOUT FLUORO;  Surgeon: Julian Hy, DO;  Location: [facility];  Service: Endoscopy;  Laterality: N/A;    Allergies  Allergies  Allergen Reactions   Bee Venom Anaphylaxis   Wasp Venom Shortness Of Breath and Swelling   Doxycycline Nausea And Vomiting   Lovastatin     Other reaction(s): muscle aches   Naproxen     Other reaction(s): nausea    EKGs/Labs/Other Studies Reviewed:   The following studies were reviewed today:  Echo 06/03/22 IMPRESSIONS     1. Left ventricular ejection fraction, by estimation, is 55 to 60%. The  left ventricle has normal function. The left ventricle has no regional  wall motion abnormalities.  There is mild concentric left ventricular  hypertrophy. Left ventricular diastolic  parameters are indeterminate.   2. Right ventricular systolic function is normal. The right ventricular  size is normal.   3. The mitral valve is normal in structure. No evidence of mitral valve  regurgitation. No evidence of mitral stenosis.   4. Tricuspid valve regurgitation is moderate.   5. The aortic valve is calcified. Aortic valve regurgitation is trivial.  Mild to moderate aortic valve stenosis. Aortic valve area, by VTI measures  1.48 cm. Aortic valve mean gradient measures 9.0 mmHg. Aortic valve Vmax  measures 2.06 m/s.   6. The inferior vena cava is normal in size with greater than 50%  respiratory variability, suggesting right atrial pressure of 3 mmHg.   FINDINGS   Left Ventricle: Left ventricular ejection fraction, by estimation, is 55  to 60%. The left ventricle has normal function. The left ventricle has no  regional wall motion abnormalities. The left  ventricular internal cavity  size was normal in size. There is   mild concentric left ventricular hypertrophy. Left ventricular diastolic  parameters are indeterminate.   Right Ventricle: The right ventricular size is normal. No increase in  right ventricular wall thickness. Right ventricular systolic function is  normal.   Left Atrium: Left atrial size was normal in size.   Right Atrium: Right atrial size was normal in size.   Pericardium: There is no evidence of pericardial effusion.   Mitral Valve: The mitral valve is normal in structure. No evidence of  mitral valve regurgitation. No evidence of mitral valve stenosis.   Tricuspid Valve: The tricuspid valve is normal in structure. Tricuspid  valve regurgitation is moderate . No evidence of tricuspid stenosis.   Aortic Valve: The aortic valve is calcified. Aortic valve regurgitation is  trivial. Mild to moderate aortic stenosis is present. Aortic valve mean  gradient measures  9.0 mmHg. Aortic valve peak gradient measures 17.0 mmHg.  Aortic valve area, by VTI  measures 1.48 cm.   Pulmonic Valve: The pulmonic valve was normal in structure. Pulmonic valve  regurgitation is not visualized. No evidence of pulmonic stenosis.   Aorta: The aortic root is normal in size and structure.   Venous: The inferior vena cava is normal in size with greater than 50%  respiratory variability, suggesting right atrial pressure of 3 mmHg.   IAS/Shunts: No atrial level shunt detected by color flow Doppler.       EKG:  EKG is  ordered today.  The ekg ordered today demonstrates normal sinus rhythm, 65 bpm  Recent Labs: 04/14/2022: ALT 12; BUN 28; Creat 0.92; Hemoglobin 10.4; Platelets 348; Potassium 4.8; Sodium 140  Recent Lipid Panel No results found for: "CHOL", "TRIG", "HDL", "CHOLHDL", "VLDL", "LDLCALC", "LDLDIRECT"   Home Medications   Current Meds  Medication Sig   albuterol (VENTOLIN HFA) 108 (90 Base) MCG/ACT inhaler Inhale 1 puff into the lungs every 6 (six) hours as needed for wheezing or shortness of breath.   ALPHA LIPOIC ACID PO Take by mouth.   Biotin 5000 MCG CAPS Take 5,000 mcg by mouth every evening.   Ferrous Sulfate (IRON) 325 (65 Fe) MG TABS Take 1 tablet by mouth.   gabapentin (NEURONTIN) 400 MG capsule TAKE 1 CAPSULE BY MOUTH 4 TIMES DAILY   gabapentin (NEURONTIN) 600 MG tablet Take 1 tablet (600 mg total) by mouth 2 (two) times daily.   Glycopyrrolate-Formoterol (BEVESPI AEROSPHERE) 9-4.8 MCG/ACT AERO Inhale 2 puffs into the lungs 2 (two) times daily.   HYDROcodone-acetaminophen (NORCO/VICODIN) 5-325 MG tablet Take 1 tablet by mouth 2 (two) times daily as needed.   levothyroxine (SYNTHROID, LEVOTHROID) 75 MCG tablet Take 75 mcg by mouth at bedtime.   Multiple Vitamin (ONE-A-DAY ESSENTIAL PO) Take by mouth.   vitamin B-12 (CYANOCOBALAMIN) 500 MCG tablet Take 500 mcg by mouth every evening.     Review of Systems      All other systems reviewed and  are otherwise negative except as noted above.  Physical Exam    VS:  BP 110/72   Pulse 65   Ht '5\' 4"'$  (1.626 m)   Wt 136 lb 9.6 oz (62 kg)   SpO2 96%   BMI 23.45 kg/m  , BMI Body mass index is 23.45 kg/m.  Wt Readings from Last 3 Encounters:  06/18/22 136 lb 9.6 oz (62 kg)  04/14/22 136 lb (61.7 kg)  10/09/21 141 lb 12.8 oz (64.3 kg)     GEN: Well  nourished, well developed, in no acute distress. HEENT: normal. Neck: Supple, no JVD, carotid bruits, or masses. Cardiac: RRR, no murmurs, rubs, or gallops. No clubbing, cyanosis, edema.  Radials/PT 2+ and equal bilaterally.  Respiratory:  Respirations regular and unlabored, clear to auscultation bilaterally. GI: Soft, nontender, nondistended. MS: No deformity or atrophy. Skin: Warm and dry, no rash. Neuro:  Strength and sensation are intact. Psych: Normal affect.  Assessment & Plan    Fatigue -acute onset a few weeks ago -extensive cardiac disease in her family -she has never had an ischemic workup -offered cardiac CT but due to hx of contrast allergy involving hospitalization, declined at this time -would like to discuss with her pulmonary doctor  Aortic stenosis -moderate stenosis, reviewed echo with the patient today -continue current medications -denies lightheadedness/dizziness -denies syncope/presyncope -continue annual echos  Neuropathy -continue gabapentin -she sees a pain management doctor  AAA -no aneurysm noted on echo -avoiding contrast due to contrast allergy and reaction-severe headache requiring hospitalization  Hypothyroidism -TSH 1.4 -per primary  Hyperlipidemia -LDL 131, goal < 100 -will be due for updated lipid panel in the summer          Disposition: Follow up 6 month with Jenkins Rouge, MD or APP.  Signed, Elgie Collard, PA-C 06/18/2022, 5:43 PM [facility]

## 2022-06-18 ENCOUNTER — Ambulatory Visit: Payer: Medicare Other | Attending: Physician Assistant | Admitting: Physician Assistant

## 2022-06-18 ENCOUNTER — Encounter: Payer: Self-pay | Admitting: Physician Assistant

## 2022-06-18 VITALS — BP 110/72 | HR 65 | Ht 64.0 in | Wt 136.6 lb

## 2022-06-18 DIAGNOSIS — G609 Hereditary and idiopathic neuropathy, unspecified: Secondary | ICD-10-CM | POA: Diagnosis not present

## 2022-06-18 DIAGNOSIS — I714 Abdominal aortic aneurysm, without rupture, unspecified: Secondary | ICD-10-CM | POA: Diagnosis not present

## 2022-06-18 DIAGNOSIS — E039 Hypothyroidism, unspecified: Secondary | ICD-10-CM | POA: Diagnosis not present

## 2022-06-18 DIAGNOSIS — E782 Mixed hyperlipidemia: Secondary | ICD-10-CM | POA: Diagnosis not present

## 2022-06-18 DIAGNOSIS — I350 Nonrheumatic aortic (valve) stenosis: Secondary | ICD-10-CM | POA: Diagnosis not present

## 2022-06-18 DIAGNOSIS — R0602 Shortness of breath: Secondary | ICD-10-CM | POA: Diagnosis not present

## 2022-06-18 NOTE — Patient Instructions (Signed)
Medication Instructions:  Your physician recommends that you continue on your current medications as directed. Please refer to the Current Medication list given to you today.  *If you need a refill on your cardiac medications before your next appointment, please call your pharmacy*   Lab Work: None ordered If you have labs (blood work) drawn today and your tests are completely normal, you will receive your results only by: [place] (if you have MyChart) OR A paper copy in the mail If you have any lab test that is abnormal or we need to change your treatment, we will call you to review the results.   Follow-Up: At [facility], you and your health needs are our priority.  As part of our continuing mission to provide you with exceptional heart care, we have created designated Provider Care Teams.  These Care Teams include your primary Cardiologist (physician) and Advanced Practice Providers (APPs -  Physician Assistants and Nurse Practitioners) who all work together to provide you with the care you need, when you need it.   Your next appointment:   6 month(s)  Provider:   Jenkins Rouge, MD

## 2022-06-19 DIAGNOSIS — M79642 Pain in left hand: Secondary | ICD-10-CM | POA: Diagnosis not present

## 2022-06-19 DIAGNOSIS — L989 Disorder of the skin and subcutaneous tissue, unspecified: Secondary | ICD-10-CM | POA: Diagnosis not present

## 2022-06-20 DIAGNOSIS — L989 Disorder of the skin and subcutaneous tissue, unspecified: Secondary | ICD-10-CM | POA: Insufficient documentation

## 2022-06-23 DIAGNOSIS — D0462 Carcinoma in situ of skin of left upper limb, including shoulder: Secondary | ICD-10-CM | POA: Diagnosis not present

## 2022-06-23 DIAGNOSIS — D485 Neoplasm of uncertain behavior of skin: Secondary | ICD-10-CM | POA: Diagnosis not present

## 2022-07-06 DIAGNOSIS — L298 Other pruritus: Secondary | ICD-10-CM | POA: Diagnosis not present

## 2022-07-06 DIAGNOSIS — R3915 Urgency of urination: Secondary | ICD-10-CM | POA: Diagnosis not present

## 2022-07-08 DIAGNOSIS — G894 Chronic pain syndrome: Secondary | ICD-10-CM | POA: Diagnosis not present

## 2022-07-08 DIAGNOSIS — G4700 Insomnia, unspecified: Secondary | ICD-10-CM | POA: Diagnosis not present

## 2022-07-08 DIAGNOSIS — M461 Sacroiliitis, not elsewhere classified: Secondary | ICD-10-CM | POA: Diagnosis not present

## 2022-07-08 DIAGNOSIS — M47816 Spondylosis without myelopathy or radiculopathy, lumbar region: Secondary | ICD-10-CM | POA: Diagnosis not present

## 2022-07-20 DIAGNOSIS — R300 Dysuria: Secondary | ICD-10-CM | POA: Diagnosis not present

## 2022-08-25 DIAGNOSIS — M461 Sacroiliitis, not elsewhere classified: Secondary | ICD-10-CM | POA: Diagnosis not present

## 2022-08-25 DIAGNOSIS — M7061 Trochanteric bursitis, right hip: Secondary | ICD-10-CM | POA: Diagnosis not present

## 2022-08-25 DIAGNOSIS — G894 Chronic pain syndrome: Secondary | ICD-10-CM | POA: Diagnosis not present

## 2022-08-25 DIAGNOSIS — M47816 Spondylosis without myelopathy or radiculopathy, lumbar region: Secondary | ICD-10-CM | POA: Diagnosis not present

## 2022-08-25 DIAGNOSIS — G4700 Insomnia, unspecified: Secondary | ICD-10-CM | POA: Diagnosis not present

## 2022-08-28 DIAGNOSIS — M461 Sacroiliitis, not elsewhere classified: Secondary | ICD-10-CM | POA: Diagnosis not present

## 2022-09-02 DIAGNOSIS — G4700 Insomnia, unspecified: Secondary | ICD-10-CM | POA: Diagnosis not present

## 2022-09-02 DIAGNOSIS — G894 Chronic pain syndrome: Secondary | ICD-10-CM | POA: Diagnosis not present

## 2022-09-02 DIAGNOSIS — M47816 Spondylosis without myelopathy or radiculopathy, lumbar region: Secondary | ICD-10-CM | POA: Diagnosis not present

## 2022-09-02 DIAGNOSIS — M7061 Trochanteric bursitis, right hip: Secondary | ICD-10-CM | POA: Diagnosis not present

## 2022-09-07 ENCOUNTER — Telehealth: Payer: Self-pay | Admitting: Pulmonary Disease

## 2022-09-07 NOTE — Telephone Encounter (Signed)
PT making a 1 yr Fu appt and wonders if Dr. Francine Graven wants to have a CT scan done. She has nodules and even tho the AVS notes do not call for a CT was this possibly an oversite. Pls call PT @ [phone]

## 2022-09-08 NOTE — Telephone Encounter (Signed)
No need for further CT monitoring, stated in my last OV note.  Thanks, JD

## 2022-09-08 NOTE — Telephone Encounter (Signed)
Dr. Francine Graven please advise if patient is supposed to have Chest CT before next appointment

## 2022-09-09 NOTE — Telephone Encounter (Signed)
Spoke with patient. Advised per Dr. Francine Graven no further Ct monitoring is needed-since last years was stable. She verbalized understanding. NFN

## 2022-10-08 ENCOUNTER — Encounter: Payer: Self-pay | Admitting: Pulmonary Disease

## 2022-10-08 ENCOUNTER — Ambulatory Visit: Payer: Medicare Other | Admitting: Pulmonary Disease

## 2022-10-08 VITALS — BP 118/72 | HR 71 | Ht 64.0 in | Wt 132.0 lb

## 2022-10-08 DIAGNOSIS — J449 Chronic obstructive pulmonary disease, unspecified: Secondary | ICD-10-CM | POA: Diagnosis not present

## 2022-10-08 NOTE — Progress Notes (Signed)
Synopsis: Referred in March 2021 for shortness of breath by Clovis Riley, L.August Saucer, MD. Former patient of Dr. Chestine Spore.  Subjective:   PATIENT ID: Janet Giles GENDER: female DOB: 1941/01/02, MRN: [id number]  HPI  Chief Complaint  Patient presents with   Follow-up    31yr f/u for COPD. States her breathing has been stable since last visit. Only notices the increase in SOB with exertion and heat.    Janet Giles is an 82 year old woman, former smoker with COPD (FEV1 1.14L, 57%) and pulmonary nodule who returns to pulmonary clinic for follow up.   She is doing well on bevespi 2 puffs twice daily and as needed albuterol. She has increased dyspnea when working in her yard and she does get relief from albuterol. No issues with cough or mucous production.   OV 10/22/21 She continues to do well with Bevespi 2 puffs twice daily. She rarely needs albuterol. She reports increased symptoms of GERD.   CT Chest 09/24/21 show stable tree in bud nodularity of the RUL, no other new suspicious nodules and chronic areas of mild peripheral interstitial thickening.   Initial OV 10/22/20 She was previously followed by Dr. Chestine Spore. She is on Bevespi twice daily for her COPD and rarely uses albuterol. Her breathing remains overall well since starting the bevespi.   She had a follow-up CT chest scan on 09/23/20 which showed stable tree-in-bud nodularity of the RUL from previous scan 05/28/19. No new suspicious nodules and resolution of the LLL groundglass opacity.   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 Channing Mutters   Degenerative joint disease of spine    buttocks pain -remains a problem   Family history of adverse  reaction to anesthesia    one sister nasea and vomiting   Former smoker 08/13/2016   GERD (gastroesophageal reflux disease)    occ   High risk medication use 08/06/2016   History of total hip replacement, right 08/13/2016   Hyperlipidemia    Hypothyroidism    Idiopathic peripheral neuropathy 08/13/2016   Lupus (HCC)    Dr. 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 08/06/2016   Shortness of breath dyspnea    uses inhaler occ   Systemic lupus erythematosus (HCC)      Family History  Problem Relation Age of Onset   Diabetes Mother    Heart disease Mother    Heart attack Mother    Bleeding Disorder Father    Peptic Ulcer Father    Heart disease Sister    Heart attack Sister    Cancer Sister    Deep vein thrombosis Sister    Diabetes Sister    Hyperlipidemia Sister    Hypertension Sister    Varicose Veins Sister    AAA (abdominal aortic aneurysm) Sister    Bleeding Disorder Sister    Breast cancer Sister    Heart disease Brother    Heart attack Brother  Hypertension Brother      Social History   Socioeconomic History   Marital status: Married    Spouse name: Not on file   Number of children: 3   Years of education: Not on file   Highest education level: Some college, no degree  Occupational History   Occupation: Retired  Tobacco Use   Smoking status: Former    Packs/day: 1.50    Years: 30.00    Additional pack years: 0.00    Total pack years: 45.00    Types: Cigarettes    Quit date: 05/11/2000    Years since quitting: 22.4    Passive exposure: Never   Smokeless tobacco: Never  Vaping Use   Vaping Use: Never used  Substance and Sexual Activity   Alcohol use: Never    Alcohol/week: 0.0 standard  drinks of alcohol   Drug use: Never   Sexual activity: Not on file  Other Topics Concern   Not on file  Social History Narrative   Right handed   2 cups of caffeine daily   Lives at home with husband   Social Determinants of Health   Financial Resource Strain: Not on file  Food Insecurity: Not on file  Transportation Needs: Not on file  Physical Activity: Not on file  Stress: Not on file  Social Connections: Not on file  Intimate Partner Violence: Not on file     Allergies  Allergen Reactions   Bee Venom Anaphylaxis   Wasp Venom Shortness Of Breath and Swelling   Doxycycline Nausea And Vomiting   Lovastatin     Other reaction(s): muscle aches   Naproxen     Other reaction(s): nausea     Outpatient Medications Prior to Visit  Medication Sig Dispense Refill   albuterol (VENTOLIN HFA) 108 (90 Base) MCG/ACT inhaler Inhale 1 puff into the lungs every 6 (six) hours as needed for wheezing or shortness of breath. 6.7 g 6   ALPHA LIPOIC ACID PO Take by mouth.     Calcium Carb-Cholecalciferol (CALCIUM + D3 PO) Take 1 tablet by mouth daily.     Ferrous Sulfate (IRON) 325 (65 Fe) MG TABS Take 1 tablet by mouth.     gabapentin (NEURONTIN) 400 MG capsule TAKE 1 CAPSULE BY MOUTH 4 TIMES DAILY     gabapentin (NEURONTIN) 600 MG tablet Take 1 tablet (600 mg total) by mouth 2 (two) times daily. 180 tablet 0   Glycopyrrolate-Formoterol (BEVESPI AEROSPHERE) 9-4.8 MCG/ACT AERO Inhale 2 puffs into the lungs 2 (two) times daily. 10.7 g 11   HYDROcodone-acetaminophen (NORCO/VICODIN) 5-325 MG tablet Take 1 tablet by mouth 2 (two) times daily as needed.     levothyroxine (SYNTHROID, LEVOTHROID) 75 MCG tablet Take 75 mcg by mouth at bedtime.     Multiple Vitamin (ONE-A-DAY ESSENTIAL PO) Take by mouth.     vitamin B-12 (CYANOCOBALAMIN) 500 MCG tablet Take 500 mcg by mouth every evening.     Biotin 5000 MCG CAPS Take 5,000 mcg by mouth every evening.     famotidine (PEPCID) 20 MG tablet Take 1 tablet  (20 mg total) by mouth daily. (Patient not taking: Reported on 06/18/2022) 30 tablet 0   No facility-administered medications prior to visit.    Review of Systems  Constitutional:  Negative for chills, fever, malaise/fatigue and weight loss.  HENT:  Negative for congestion, sinus pain and sore throat.   Eyes: Negative.   Respiratory:  Negative for cough, hemoptysis, sputum production, shortness of breath and wheezing.  Cardiovascular:  Negative for chest pain, palpitations, orthopnea, claudication and leg swelling.  Gastrointestinal:  Negative for abdominal pain, heartburn, nausea and vomiting.  Genitourinary: Negative.   Musculoskeletal:  Negative for joint pain and myalgias.  Skin:  Negative for rash.  Neurological:  Negative for weakness.  Endo/Heme/Allergies: Negative.   Psychiatric/Behavioral: Negative.      Objective:   Vitals:   10/08/22 1327  BP: 118/72  Pulse: 71  SpO2: 97%  Weight: 132 lb (59.9 kg)  Height: 5\' 4"  (1.626 m)      Physical Exam Constitutional:      General: She is not in acute distress.    Appearance: She is not ill-appearing.  HENT:     Head: Normocephalic and atraumatic.  Eyes:     General: No scleral icterus.    Conjunctiva/sclera: Conjunctivae normal.  Cardiovascular:     Rate and Rhythm: Normal rate and regular rhythm.     Pulses: Normal pulses.     Heart sounds: Normal heart sounds. No murmur heard. Pulmonary:     Effort: Pulmonary effort is normal.     Breath sounds: Normal breath sounds. No wheezing, rhonchi or rales.  Musculoskeletal:     Right lower leg: No edema.     Left lower leg: No edema.  Skin:    General: Skin is warm and dry.  Neurological:     General: No focal deficit present.     Mental Status: She is alert.     CBC    Component Value Date/Time   WBC 6.6 04/14/2022 1112   RBC 3.19 (L) 04/14/2022 1112   HGB 10.4 (L) 04/14/2022 1112   HCT 30.3 (L) 04/14/2022 1112   PLT 348 04/14/2022 1112   MCV 95.0  04/14/2022 1112   MCH 32.6 04/14/2022 1112   MCHC 34.3 04/14/2022 1112   RDW 12.0 04/14/2022 1112   LYMPHSABS 1,769 04/14/2022 1112   MONOABS 0.4 07/21/2019 1127   EOSABS 211 04/14/2022 1112   BASOSABS 40 04/14/2022 1112      Latest Ref Rng & Units 04/14/2022   11:12 AM 04/10/2021   11:42 AM 10/10/2019   11:18 AM  BMP  Glucose 65 - 99 mg/dL 76  295  75   BUN 7 - 25 mg/dL 28  22  19    Creatinine 0.60 - 0.95 mg/dL 6.21  3.08  6.57   BUN/Creat Ratio 6 - 22 (calc) 30  NOT APPLICABLE  NOT APPLICABLE   Sodium 135 - 146 mmol/L 140  141  138   Potassium 3.5 - 5.3 mmol/L 4.8  4.4  4.8   Chloride 98 - 110 mmol/L 103  105  105   CO2 20 - 32 mmol/L 31  28  27    Calcium 8.6 - 10.4 mg/dL 84.6  9.5  9.7     Chest imaging: CT Chest 09/24/21 1. No acute findings. 2. Tree-in-bud type areas of nodularity, most evident in the right upper lobe, are stable, benign, consistent with a chronic inflammatory etiology. 3. No suspicious lung nodules. 4. Chronic areas of mild peripheral interstitial thickening. 5. Aortic atherosclerosis and coronary artery calcifications.  CT Chest 09/23/2020 Stable tree-in-bud branching nodularity in the RIGHT upper lobe consistent with chronic infectious or inflammatory process. Resolution of ground-glass opacities in the LEFT lower lobe.  CT chest 07/05/2019 mild bronchiectasis in all lobes, right upper lobe peripheral tree-in-bud opacities, associated micronodules.  No significant nodularity in RML, minimal distal inferior lingular nodules. Linear scarring bilateral lower lobes.  Borderline  mediastinal adenopathy.   CT chest 09/25/2019 Apical irregular nodules and tree-in-bud opacities.  Emphysema and airway thickening.  Pleural thickening of major fissure on the left.  All nodules shrinking other than nodule in azygo-esophageal recess, which is stable.  Senescent vascular calcification.  PFT:    Latest Ref Rng & Units 09/04/2019   11:52 AM 01/09/2014    3:34 PM  PFT  Results  FVC-Pre L 1.72  2.01   FVC-Predicted Pre % 64  69   FVC-Post L 2.06  2.33   FVC-Predicted Post % 77  81   Pre FEV1/FVC % % 60  63   Post FEV1/FCV % % 55  60   FEV1-Pre L 1.03  1.26   FEV1-Predicted Pre % 52  58   FEV1-Post L 1.14  1.39   DLCO uncorrected ml/min/mmHg 12.20  11.79   DLCO UNC% % 64  48   DLCO corrected ml/min/mmHg 13.65    DLCO COR %Predicted % 72    DLVA Predicted % 93  69   TLC L 4.90  4.45   TLC % Predicted % 96  88   RV % Predicted % 134  105   PFT 2021: moderately severe obstructive defect with mild diffusion defect   Labs: Micro: 08/07/2019 BAL fungus-penicillium species 08/07/2019 BAL AFB-negative 08/07/2019 BAL respiratory-negative 08/07/2019 BAL fungus lingula-negative 08/07/2019 BAL AFB lingula-negative 08/07/2019 BAL respiratory-negative  Echocardiogram 10/06/2018: LVEF 55 to 60%, grade 1 diastolic dysfunction with elevated LVEDP.  Normal LA, RV, RA.  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  Mild MR.    Assessment & Plan:   Chronic obstructive pulmonary disease, unspecified COPD type (HCC)  Discussion: Janet Giles is an 82 year old woman, former smoker with COPD (FEV1 1.14L, 57%) and pulmonary nodule who returns to pulmonary clinic for follow up.   She is to continue bevespi twice daily and as needed albuterol for her COPD which is stable at this time.   Follow up in 1 year.   Melody Comas, MD [facility] Office: [phone]     Current Outpatient Medications:    albuterol (VENTOLIN HFA) 108 (90 Base) MCG/ACT inhaler, Inhale 1 puff into the lungs every 6 (six) hours as needed for wheezing or shortness of breath., Disp: 6.7 g, Rfl: 6   ALPHA LIPOIC ACID PO, Take by mouth., Disp: , Rfl:    Calcium Carb-Cholecalciferol (CALCIUM + D3 PO), Take 1 tablet by mouth daily., Disp: , Rfl:    Ferrous Sulfate (IRON) 325 (65 Fe) MG TABS, Take 1 tablet by mouth., Disp: , Rfl:     gabapentin (NEURONTIN) 400 MG capsule, TAKE 1 CAPSULE BY MOUTH 4 TIMES DAILY, Disp: , Rfl:    gabapentin (NEURONTIN) 600 MG tablet, Take 1 tablet (600 mg total) by mouth 2 (two) times daily., Disp: 180 tablet, Rfl: 0   Glycopyrrolate-Formoterol (BEVESPI AEROSPHERE) 9-4.8 MCG/ACT AERO, Inhale 2 puffs into the lungs 2 (two) times daily., Disp: 10.7 g, Rfl: 11   HYDROcodone-acetaminophen (NORCO/VICODIN) 5-325 MG tablet, Take 1 tablet by mouth 2 (two) times daily as needed., Disp: , Rfl:    levothyroxine (SYNTHROID, LEVOTHROID) 75 MCG tablet, Take 75 mcg by mouth at bedtime., Disp: , Rfl:    Multiple Vitamin (ONE-A-DAY ESSENTIAL PO), Take by mouth., Disp: , Rfl:    vitamin B-12 (CYANOCOBALAMIN) 500 MCG tablet, Take 500 mcg by mouth every evening., Disp: , Rfl:

## 2022-10-08 NOTE — Patient Instructions (Signed)
Continue bevespi inhaler 2 puffs twice daily and as needed albuterol  Please let us know when you need refills  Follow up in 1 year

## 2022-10-22 DIAGNOSIS — G894 Chronic pain syndrome: Secondary | ICD-10-CM | POA: Diagnosis not present

## 2022-10-22 DIAGNOSIS — G4700 Insomnia, unspecified: Secondary | ICD-10-CM | POA: Diagnosis not present

## 2022-10-22 DIAGNOSIS — M7061 Trochanteric bursitis, right hip: Secondary | ICD-10-CM | POA: Diagnosis not present

## 2022-10-22 DIAGNOSIS — M47816 Spondylosis without myelopathy or radiculopathy, lumbar region: Secondary | ICD-10-CM | POA: Diagnosis not present

## 2022-10-29 DIAGNOSIS — D649 Anemia, unspecified: Secondary | ICD-10-CM | POA: Diagnosis not present

## 2022-10-29 DIAGNOSIS — H6121 Impacted cerumen, right ear: Secondary | ICD-10-CM | POA: Diagnosis not present

## 2022-10-29 DIAGNOSIS — J449 Chronic obstructive pulmonary disease, unspecified: Secondary | ICD-10-CM | POA: Diagnosis not present

## 2022-10-29 DIAGNOSIS — G629 Polyneuropathy, unspecified: Secondary | ICD-10-CM | POA: Diagnosis not present

## 2022-10-29 DIAGNOSIS — Z0000 Encounter for general adult medical examination without abnormal findings: Secondary | ICD-10-CM | POA: Diagnosis not present

## 2022-10-29 DIAGNOSIS — I700 Atherosclerosis of aorta: Secondary | ICD-10-CM | POA: Diagnosis not present

## 2022-10-29 DIAGNOSIS — I77811 Abdominal aortic ectasia: Secondary | ICD-10-CM | POA: Diagnosis not present

## 2022-10-29 DIAGNOSIS — I7300 Raynaud's syndrome without gangrene: Secondary | ICD-10-CM | POA: Diagnosis not present

## 2022-10-29 DIAGNOSIS — E7800 Pure hypercholesterolemia, unspecified: Secondary | ICD-10-CM | POA: Diagnosis not present

## 2022-10-29 DIAGNOSIS — M19042 Primary osteoarthritis, left hand: Secondary | ICD-10-CM | POA: Diagnosis not present

## 2022-10-29 DIAGNOSIS — M19041 Primary osteoarthritis, right hand: Secondary | ICD-10-CM | POA: Diagnosis not present

## 2022-10-29 DIAGNOSIS — E039 Hypothyroidism, unspecified: Secondary | ICD-10-CM | POA: Diagnosis not present

## 2022-10-29 DIAGNOSIS — I350 Nonrheumatic aortic (valve) stenosis: Secondary | ICD-10-CM | POA: Diagnosis not present

## 2022-10-30 ENCOUNTER — Other Ambulatory Visit: Payer: Self-pay | Admitting: Internal Medicine

## 2022-10-30 DIAGNOSIS — M8580 Other specified disorders of bone density and structure, unspecified site: Secondary | ICD-10-CM

## 2022-11-24 ENCOUNTER — Other Ambulatory Visit: Payer: Self-pay | Admitting: Internal Medicine

## 2022-11-24 DIAGNOSIS — Z1231 Encounter for screening mammogram for malignant neoplasm of breast: Secondary | ICD-10-CM

## 2022-12-03 DIAGNOSIS — M461 Sacroiliitis, not elsewhere classified: Secondary | ICD-10-CM | POA: Diagnosis not present

## 2022-12-08 ENCOUNTER — Ambulatory Visit
Admission: RE | Admit: 2022-12-08 | Discharge: 2022-12-08 | Disposition: A | Discharge status: Home / Self Care | Payer: Medicare Other | Source: Ambulatory Visit | Attending: Internal Medicine

## 2022-12-08 DIAGNOSIS — Z1231 Encounter for screening mammogram for malignant neoplasm of breast: Secondary | ICD-10-CM | POA: Diagnosis not present

## 2022-12-16 DIAGNOSIS — D3132 Benign neoplasm of left choroid: Secondary | ICD-10-CM | POA: Diagnosis not present

## 2022-12-16 DIAGNOSIS — Z961 Presence of intraocular lens: Secondary | ICD-10-CM | POA: Diagnosis not present

## 2022-12-16 DIAGNOSIS — H524 Presbyopia: Secondary | ICD-10-CM | POA: Diagnosis not present

## 2022-12-16 DIAGNOSIS — H35363 Drusen (degenerative) of macula, bilateral: Secondary | ICD-10-CM | POA: Diagnosis not present

## 2022-12-17 DIAGNOSIS — H6121 Impacted cerumen, right ear: Secondary | ICD-10-CM | POA: Diagnosis not present

## 2022-12-17 DIAGNOSIS — M47816 Spondylosis without myelopathy or radiculopathy, lumbar region: Secondary | ICD-10-CM | POA: Diagnosis not present

## 2022-12-17 DIAGNOSIS — Z79891 Long term (current) use of opiate analgesic: Secondary | ICD-10-CM | POA: Diagnosis not present

## 2022-12-17 DIAGNOSIS — G4700 Insomnia, unspecified: Secondary | ICD-10-CM | POA: Diagnosis not present

## 2022-12-17 DIAGNOSIS — M7061 Trochanteric bursitis, right hip: Secondary | ICD-10-CM | POA: Diagnosis not present

## 2022-12-17 DIAGNOSIS — G894 Chronic pain syndrome: Secondary | ICD-10-CM | POA: Diagnosis not present

## 2022-12-22 NOTE — Progress Notes (Deleted)
CARDIOLOGY CONSULT NOTE       Patient ID: Hanh Pinks MRN: [id number] DOB/AGE: Dec 07, 1940 82 y.o.   HPI:  82 y.o. f/u for aortic valve disease Last echo done 05/08/19 with tri leaflet AV mean gradient Only 7 mmHg normal EF and mild MR She has also had serial abdominal US for AAA but most recent 04/27/19 only 2.7 cm unchanged from 2018 She has history of hypothyroidism As well as asthma and neuropathy in her feet takes Gabapentin and sees Dr Anne Hahn in neurology   She has had exertional dyspnea worse last few months. Diagnosed with asthma Has not had PFTls in years No history of DVT/ PE or connective tissue disease   CT done 03/27/20 sowed RUL bronchiectasis with tree bud nodularity along with ground glass attenuation in left lung base Seen by [facility] Dr Chestine Spore 11/22/19 improved on Bevespi and using Albuterol less She had bronchoscopy 08/07/19 with negative cultures  Dr Chestine Spore felt a lot of the changes from mucoid impaction rather than active infection   Biggest issue is peripheral neuropathy in feet Back pain Skin Lupus with positive ANA And arthritis in hands and feet She has seen neurology and orthopedics Seems mad that She got taken off narcotic pain meds last July    TTE 05/27/21 reviewed EF 60-65% mild AS mean gradient 9 peak 16 mmHg DVI 0.42 TTE 06/03/22 reviewed EF 60-65% mild AS mean gradient 9 peak 17 mmHg DVI 0.39 AVA 1.5  ***  ROS All other systems reviewed and negative except as noted above  Past Medical History:  Diagnosis Date   AAA (abdominal aortic aneurysm) (HCC)    ANA positive 08/06/2016   Aortic ectasia, 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 Channing Mutters   Degenerative joint disease of spine    buttocks pain -remains a problem   Family history of adverse reaction to  anesthesia    one sister nasea and vomiting   Former smoker 08/13/2016   GERD (gastroesophageal reflux disease)    occ   High risk medication use 08/06/2016   History of total hip replacement, right 08/13/2016   Hyperlipidemia    Hypothyroidism    Idiopathic peripheral neuropathy 08/13/2016   Lupus (HCC)    Dr. 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 08/06/2016   Shortness of breath dyspnea    uses inhaler occ   Systemic lupus erythematosus (HCC)     Family History  Problem Relation Age of Onset   Diabetes Mother    Heart disease Mother    Heart attack Mother    Bleeding Disorder Father    Peptic Ulcer Father    Heart disease Sister    Heart attack Sister    Cancer Sister    Deep vein thrombosis Sister    Diabetes Sister    Hyperlipidemia Sister    Hypertension Sister    Varicose Veins Sister    AAA (abdominal aortic aneurysm) Sister    Bleeding Disorder Sister    Breast cancer Sister    Heart disease Brother  Heart attack Brother    Hypertension Brother     Social History   Socioeconomic History   Marital status: Married    Spouse name: Not on file   Number of children: 3   Years of education: Not on file   Highest education level: Some college, no degree  Occupational History   Occupation: Retired  Tobacco Use   Smoking status: Former    Current packs/day: 0.00    Average packs/day: 1.5 packs/day for 30.0 years (45.0 ttl pk-yrs)    Types: Cigarettes    Start date: 05/11/1970    Quit date: 05/11/2000    Years since quitting: 22.6    Passive exposure: Never   Smokeless tobacco: Never  Vaping Use   Vaping status: Never Used  Substance and Sexual Activity   Alcohol use: Never    Alcohol/week:  0.0 standard drinks of alcohol   Drug use: Never   Sexual activity: Not on file  Other Topics Concern   Not on file  Social History Narrative   Right handed   2 cups of caffeine daily   Lives at home with husband   Social Determinants of Health   Financial Resource Strain: Not on file  Food Insecurity: Low Risk  (12/17/2022)   Received from [facility]   Food vital sign    Within the past 12 months, you worried that your food would run out before you got money to buy more: Never true    Within the past 12 months, the food you bought just didn't last and you didn't have money to get more. : Never true  Transportation Needs: Not on file (12/17/2022)  Physical Activity: Not on file  Stress: Not on file  Social Connections: Not on file  Intimate Partner Violence: Not on file    Past Surgical History:  Procedure Laterality Date   ABDOMINAL HYSTERECTOMY     BACK SURGERY  02   L4-L5- fusion   BRONCHIAL BRUSHINGS  08/07/2019   Procedure: BRONCHIAL BRUSHINGS;  Surgeon: Steffanie Dunn, DO;  Location: [facility];  Service: Endoscopy;;   BRONCHIAL WASHINGS  08/07/2019   Procedure: BRONCHIAL WASHINGS;  Surgeon: Steffanie Dunn, DO;  Location: [facility];  Service: Endoscopy;;   COLONOSCOPY WITH PROPOFOL N/A 03/17/2016   Procedure: COLONOSCOPY WITH PROPOFOL;  Surgeon: Charolett Bumpers, MD;  Location: [facility];  Service: Endoscopy;  Laterality: N/A;   ESOPHAGOGASTRODUODENOSCOPY (EGD) WITH PROPOFOL N/A 03/17/2016   Procedure: ESOPHAGOGASTRODUODENOSCOPY (EGD) WITH PROPOFOL;  Surgeon: Charolett Bumpers, MD;  Location: [facility];  Service: Endoscopy;  Laterality: N/A;   JOINT REPLACEMENT     TONSILLECTOMY AND ADENOIDECTOMY     TOTAL HIP ARTHROPLASTY Right 03/25/2015   Procedure: TOTAL HIP ARTHROPLASTY ANTERIOR APPROACH;  Surgeon: Gean Birchwood, MD;  Location: [facility];  Service: Orthopedics;  Laterality: Right;   VIDEO BRONCHOSCOPY N/A 08/07/2019   Procedure: VIDEO BRONCHOSCOPY WITHOUT FLUORO;   Surgeon: Steffanie Dunn, DO;  Location: [facility];  Service: Endoscopy;  Laterality: N/A;      Current Outpatient Medications:    albuterol (VENTOLIN HFA) 108 (90 Base) MCG/ACT inhaler, Inhale 1 puff into the lungs every 6 (six) hours as needed for wheezing or shortness of breath., Disp: 6.7 g, Rfl: 6   ALPHA LIPOIC ACID PO, Take by mouth., Disp: , Rfl:    Calcium Carb-Cholecalciferol (CALCIUM + D3 PO), Take 1 tablet by mouth daily., Disp: , Rfl:    Ferrous Sulfate (IRON) 325 (65 Fe) MG TABS, Take  1 tablet by mouth., Disp: , Rfl:    gabapentin (NEURONTIN) 400 MG capsule, TAKE 1 CAPSULE BY MOUTH 4 TIMES DAILY, Disp: , Rfl:    gabapentin (NEURONTIN) 600 MG tablet, Take 1 tablet (600 mg total) by mouth 2 (two) times daily., Disp: 180 tablet, Rfl: 0   Glycopyrrolate-Formoterol (BEVESPI AEROSPHERE) 9-4.8 MCG/ACT AERO, Inhale 2 puffs into the lungs 2 (two) times daily., Disp: 10.7 g, Rfl: 11   HYDROcodone-acetaminophen (NORCO/VICODIN) 5-325 MG tablet, Take 1 tablet by mouth 2 (two) times daily as needed., Disp: , Rfl:    levothyroxine (SYNTHROID, LEVOTHROID) 75 MCG tablet, Take 75 mcg by mouth at bedtime., Disp: , Rfl:    Multiple Vitamin (ONE-A-DAY ESSENTIAL PO), Take by mouth., Disp: , Rfl:    vitamin B-12 (CYANOCOBALAMIN) 500 MCG tablet, Take 500 mcg by mouth every evening., Disp: , Rfl:     Physical Exam: There were no vitals taken for this visit.    Affect appropriate Healthy:  appears stated age HEENT: normal Neck supple with no adenopathy JVP normal no bruits no thyromegaly Lungs clear with no wheezing and good diaphragmatic motion Heart:  S1/S2 preserved mild AS murmur, no rub, gallop or click PMI normal Abdomen: benighn, BS positve, no tenderness, no AAA no bruit.  No HSM or HJR Distal pulses intact with no bruits No edema Neuro non-focal Skin warm and dry Post right THR   Labs:   Lab Results  Component Value Date   WBC 6.6 04/14/2022   HGB 10.4 (L) 04/14/2022   HCT  30.3 (L) 04/14/2022   MCV 95.0 04/14/2022   PLT 348 04/14/2022      Radiology: MM 3D SCREENING MAMMOGRAM BILATERAL BREAST  Result Date: 12/09/2022 CLINICAL DATA:  Screening. EXAM: DIGITAL SCREENING BILATERAL MAMMOGRAM WITH TOMOSYNTHESIS AND CAD TECHNIQUE: Bilateral screening digital craniocaudal and mediolateral oblique mammograms were obtained. Bilateral screening digital breast tomosynthesis was performed. The images were evaluated with computer-aided detection. COMPARISON:  Previous exam(s). ACR Breast Density Category b: There are scattered areas of fibroglandular density. FINDINGS: There are no findings suspicious for malignancy. IMPRESSION: No mammographic evidence of malignancy. A result letter of this screening mammogram will be mailed directly to the patient. RECOMMENDATION: Screening mammogram in one year. (Code:SM-B-01Y) BI-RADS CATEGORY  1: Negative. Electronically Signed   By: Bary Richard M.D.   On: 12/09/2022 15:19     EKG: SR rate 63 normal ECG 03/13/15 06/28/19 SR rate 80 normal 12/22/2022 NSR rate 90 Normal    ASSESSMENT AND PLAN:   1. Aortic Stenosis:  Mild by echo 06/03/22 mean gradient 9 mmHg observe  2. Neuropathy:  Continue gabapentin f/u Dr Anne Hahn Now using hydrocodone as well 3. AAA: not clear this needs yearly [place] f/u has diameter only 2.7 cm since 2018 f/u primary  4. Hypothyroidism :  On replacement labs with primary  5. Lipids:  F/u primary target LDL 70 or less 6. Dyspnea:  Not related to heart f/u [facility] for bronchiectasis and chronic mucoid compaction Continue Bevespi and Albuterol as well as CT surveillance  F/U in a year with echo for AS   Time: spent reviewing echo, CT;s pulmonary notes direct patient interview and composing note 20 minutes    Signed: Charlton Haws 12/22/2022, 2:21 PM

## 2022-12-28 ENCOUNTER — Ambulatory Visit: Payer: 59 | Admitting: Cardiovascular Disease

## 2022-12-31 ENCOUNTER — Ambulatory Visit: Payer: Medicare Other | Admitting: Cardiovascular Disease

## 2023-01-06 ENCOUNTER — Encounter: Payer: Self-pay | Admitting: Internal Medicine

## 2023-01-13 DIAGNOSIS — N1831 Chronic kidney disease, stage 3a: Secondary | ICD-10-CM | POA: Diagnosis not present

## 2023-01-13 DIAGNOSIS — R300 Dysuria: Secondary | ICD-10-CM | POA: Diagnosis not present

## 2023-01-14 ENCOUNTER — Telehealth: Payer: Self-pay | Admitting: Pulmonary Disease

## 2023-01-14 NOTE — Telephone Encounter (Signed)
Patient is calling for a refill on her medication. She receives it directly from the company but they need a prescription for it. The medication is bezespi areosphere. It can be faxed to [facility] [phone].

## 2023-01-15 MED ORDER — BEVESPI AEROSPHERE 9-4.8 MCG/ACT IN AERO: 2.0000 | INHALATION_SPRAY | Freq: Two times a day (BID) | RESPIRATORY_TRACT | 11 refills | Status: DC

## 2023-01-15 NOTE — Telephone Encounter (Signed)
Bevespi refill sent to [facility].

## 2023-02-02 DIAGNOSIS — H35341 Macular cyst, hole, or pseudohole, right eye: Secondary | ICD-10-CM | POA: Diagnosis not present

## 2023-02-02 DIAGNOSIS — H353131 Nonexudative age-related macular degeneration, bilateral, early dry stage: Secondary | ICD-10-CM | POA: Diagnosis not present

## 2023-02-02 DIAGNOSIS — Z961 Presence of intraocular lens: Secondary | ICD-10-CM | POA: Diagnosis not present

## 2023-02-02 DIAGNOSIS — H04123 Dry eye syndrome of bilateral lacrimal glands: Secondary | ICD-10-CM | POA: Diagnosis not present

## 2023-02-09 DIAGNOSIS — L720 Epidermal cyst: Secondary | ICD-10-CM | POA: Diagnosis not present

## 2023-02-09 DIAGNOSIS — D1801 Hemangioma of skin and subcutaneous tissue: Secondary | ICD-10-CM | POA: Diagnosis not present

## 2023-02-09 DIAGNOSIS — L820 Inflamed seborrheic keratosis: Secondary | ICD-10-CM | POA: Diagnosis not present

## 2023-02-09 DIAGNOSIS — L821 Other seborrheic keratosis: Secondary | ICD-10-CM | POA: Diagnosis not present

## 2023-02-09 DIAGNOSIS — L570 Actinic keratosis: Secondary | ICD-10-CM | POA: Diagnosis not present

## 2023-02-09 DIAGNOSIS — L814 Other melanin hyperpigmentation: Secondary | ICD-10-CM | POA: Diagnosis not present

## 2023-02-09 DIAGNOSIS — Z85828 Personal history of other malignant neoplasm of skin: Secondary | ICD-10-CM | POA: Diagnosis not present

## 2023-02-09 DIAGNOSIS — L578 Other skin changes due to chronic exposure to nonionizing radiation: Secondary | ICD-10-CM | POA: Diagnosis not present

## 2023-02-11 DIAGNOSIS — G894 Chronic pain syndrome: Secondary | ICD-10-CM | POA: Diagnosis not present

## 2023-02-11 DIAGNOSIS — M7061 Trochanteric bursitis, right hip: Secondary | ICD-10-CM | POA: Diagnosis not present

## 2023-02-11 DIAGNOSIS — M47816 Spondylosis without myelopathy or radiculopathy, lumbar region: Secondary | ICD-10-CM | POA: Diagnosis not present

## 2023-02-11 DIAGNOSIS — G4700 Insomnia, unspecified: Secondary | ICD-10-CM | POA: Diagnosis not present

## 2023-03-17 DIAGNOSIS — M461 Sacroiliitis, not elsewhere classified: Secondary | ICD-10-CM | POA: Diagnosis not present

## 2023-03-19 ENCOUNTER — Ambulatory Visit (HOSPITAL_COMMUNITY)
Admission: RE | Admit: 2023-03-19 | Discharge: 2023-03-19 | Disposition: A | Discharge status: Home / Self Care | Payer: Medicare Other | Source: Ambulatory Visit | Attending: Vascular Surgery

## 2023-03-19 ENCOUNTER — Other Ambulatory Visit (HOSPITAL_COMMUNITY): Payer: Self-pay | Admitting: Internal Medicine

## 2023-03-19 DIAGNOSIS — I714 Abdominal aortic aneurysm, without rupture, unspecified: Secondary | ICD-10-CM | POA: Insufficient documentation

## 2023-04-06 NOTE — Progress Notes (Signed)
Office Visit Note  Patient: Janet Giles             Date of Birth: Jun 02, 1940           MRN: [id number]             PCP: Clovis Riley, L.August Saucer, MD (Inactive) Referring: Clovis Riley, Elbert Ewings.August Saucer, MD Visit Date: 04/14/2023 Occupation: @GUAROCC @  Subjective:  Stiffness  History of Present Illness: Janet Giles is a 82 y.o. female with osteoarthritis, and history of cutaneous lupus.  She returns today after her last visit in December 2023.  Her cutaneous lupus remains in remission.  She had been off Plaquenil since April 2018.  She had no clinical features of systemic lupus.  She denies any history of oral ulcers, nasal ulcers, malar rash, photosensitivity.  She continues to have dry mouth and dry eye symptoms.  Which are manageable with over-the-counter products.  She has been experiencing pain and discomfort in her hands especially her right index finger DIP joint.  She would like to have a cortisone injection in the future.  Raynauds is currently not active.  She states the right middle trigger finger resolved.  She has intermittent discomfort in her right hip joint which has been replaced.  She continues to have neuropathic pain in her feet.  She has intermittent discomfort in her lower back.  She has had injections in her lower back.    Activities of Daily Living:  Patient reports morning stiffness for 1 hour.   Patient Reports nocturnal pain.  Difficulty dressing/grooming: Denies Difficulty climbing stairs: Denies Difficulty getting out of chair: Denies Difficulty using hands for taps, buttons, cutlery, and/or writing: Reports  Review of Systems  Constitutional:  Negative for fatigue.  HENT:  Positive for mouth dryness. Negative for mouth sores.   Eyes:  Positive for dryness. Negative for pain.  Respiratory:  Negative for shortness of breath.   Cardiovascular:  Negative for chest pain and palpitations.  Gastrointestinal:  Negative for blood in stool, constipation and  diarrhea.  Endocrine: Negative for increased urination.  Genitourinary:  Negative for involuntary urination.  Musculoskeletal:  Positive for joint pain, joint pain and morning stiffness. Negative for gait problem, joint swelling, myalgias, muscle weakness, muscle tenderness and myalgias.  Skin:  Negative for color change, rash, hair loss and sensitivity to sunlight.  Allergic/Immunologic: Negative for susceptible to infections.  Neurological:  Negative for dizziness and headaches.  Hematological:  Negative for swollen glands.  Psychiatric/Behavioral:  Positive for sleep disturbance. Negative for depressed mood. The patient is not nervous/anxious.     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   Former smoker 08/13/2016   ANA positive 08/06/2016   Raynaud's disease without gangrene 08/06/2016   Primary osteoarthritis of both hands 08/06/2016   Primary osteoarthritis of both feet 08/06/2016   High risk medication use 08/06/2016   Primary osteoarthritis of right hip 03/24/2015   Aortic ectasia, 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    Past Medical History:  Diagnosis Date  AAA (abdominal aortic aneurysm) (HCC)    ANA positive 08/06/2016   Aortic ectasia, 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   Asthma    Complication of anesthesia    trouble breathing after back surgery   COPD (chronic obstructive pulmonary disease) (HCC)    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 Channing Mutters   Degenerative joint disease of spine    buttocks pain -remains a problem   Family history of adverse reaction to anesthesia    one sister nasea and vomiting   Former smoker 08/13/2016   GERD (gastroesophageal reflux disease)    occ   High risk medication use 08/06/2016   History of total hip replacement, right 08/13/2016   Hyperlipidemia    Hypothyroidism    Idiopathic peripheral neuropathy 08/13/2016   Lupus    Dr. 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 08/06/2016   Shortness of breath dyspnea    uses inhaler occ   Systemic lupus erythematosus (HCC)     Family History  Problem Relation Age of Onset   Diabetes Mother    Heart disease Mother    Heart attack Mother    Bleeding Disorder Father    Peptic Ulcer Father    Heart disease Sister    Heart attack Sister    Cancer Sister    Deep vein thrombosis Sister    Diabetes Sister    Hyperlipidemia Sister    Hypertension Sister    Varicose Veins Sister    AAA (abdominal aortic aneurysm) Sister    Bleeding Disorder Sister    Breast cancer Sister    Heart disease Brother    Heart attack Brother    Hypertension Brother    Past Surgical History:  Procedure Laterality Date   ABDOMINAL HYSTERECTOMY     BACK SURGERY  02   L4-L5- fusion   BRONCHIAL BRUSHINGS  08/07/2019   Procedure: BRONCHIAL BRUSHINGS;  Surgeon: Steffanie Dunn, DO;  Location: [facility];  Service: Endoscopy;;   BRONCHIAL WASHINGS  08/07/2019   Procedure: BRONCHIAL WASHINGS;  Surgeon: Steffanie Dunn, DO;  Location: [facility];  Service: Endoscopy;;   COLONOSCOPY WITH PROPOFOL N/A 03/17/2016   Procedure: COLONOSCOPY WITH PROPOFOL;  Surgeon: Charolett Bumpers, MD;  Location: [facility];  Service: Endoscopy;  Laterality: N/A;   ESOPHAGOGASTRODUODENOSCOPY (EGD) WITH PROPOFOL N/A 03/17/2016   Procedure: ESOPHAGOGASTRODUODENOSCOPY (EGD) WITH PROPOFOL;  Surgeon: Charolett Bumpers, MD;  Location: [facility];  Service: Endoscopy;  Laterality: N/A;   JOINT REPLACEMENT     TONSILLECTOMY AND ADENOIDECTOMY     TOTAL HIP ARTHROPLASTY Right 03/25/2015   Procedure: TOTAL HIP ARTHROPLASTY ANTERIOR APPROACH;  Surgeon: Gean Birchwood, MD;  Location: [facility];  Service: Orthopedics;  Laterality: Right;   VIDEO BRONCHOSCOPY N/A 08/07/2019   Procedure: VIDEO BRONCHOSCOPY WITHOUT FLUORO;  Surgeon: Steffanie Dunn, DO;  Location: [facility];  Service: Endoscopy;  Laterality: N/A;   Social History   Social History Narrative   Right handed   2 cups of caffeine daily   Lives at home with husband   Immunization History  Administered Date(s) Administered   Fluad Quad(high Dose 65+) 02/20/2019   PFIZER(Purple Top)SARS-COV-2 Vaccination 05/24/2019, 06/14/2019, 02/26/2020   Pneumococcal Conjugate-13 08/14/2014   Pneumococcal Polysaccharide-23 10/11/1998,  06/29/2008   Td 08/13/2016   Tdap 06/29/2005     Objective: Vital Signs: BP 103/69 (BP Location: Left Arm, Patient Position: Sitting, Cuff Size: Normal)   Pulse 76   Resp 13   Ht 5\' 4"  (1.626 m)   Wt 131 lb 12.8 oz (59.8 kg)   BMI 22.62 kg/m    Physical Exam Vitals and nursing note reviewed.  Constitutional:      Appearance: She is well-developed.  HENT:     Head: Normocephalic and atraumatic.  Eyes:     Conjunctiva/sclera: Conjunctivae normal.  Cardiovascular:     Rate and Rhythm: Normal rate and regular rhythm.     Heart sounds: Normal heart sounds.  Pulmonary:     Effort: Pulmonary effort is normal.     Breath sounds: Normal breath sounds.  Abdominal:     General: Bowel sounds are normal.     Palpations: Abdomen is soft.  Musculoskeletal:     Cervical back: Normal range of motion.  Lymphadenopathy:     Cervical: No  cervical adenopathy.  Skin:    General: Skin is warm and dry.     Capillary Refill: Capillary refill takes less than 2 seconds.  Neurological:     Mental Status: She is alert and oriented to person, place, and time.  Psychiatric:        Behavior: Behavior normal.      Musculoskeletal Exam: She had limited lateral rotation of the cervical spine.  She had discomfort range of motion of her lumbar spine.  Shoulder joints, elbows, wrist joints were in good range of motion.  She had bilateral PIP and DIP thickening.  She had tenderness over her right index finger DIP joint.  No synovitis was noted.  Hip joints were in good range of motion.  Right hip joint was replaced.  Knee joints were in good range of motion without any warmth swelling or effusion.  There was no tenderness over ankles or MTPs.  CDAI Exam: CDAI Score: -- Patient Global: --; Provider Global: -- Swollen: --; Tender: -- Joint Exam 04/14/2023   No joint exam has been documented for this visit   There is currently no information documented on the homunculus. Go to the Rheumatology activity and complete the homunculus joint exam.  Investigation: No additional findings.  Imaging: VAS [place] AAA DUPLEX  Result Date: 03/19/2023 ABDOMINAL AORTA STUDY Patient Name:  KEIANA LUEDKE  Date of Exam:   03/19/2023 Medical Rec #: [id number]                 Accession #:    [id number] Date of Birth: [birth year]                  Patient Gender: F Patient Age:   42 years Exam Location:  [facility] Procedure:      VAS [place] AAA DUPLEX Referring Phys: Ardean Larsen --------------------------------------------------------------------------------  Indications: Follow up exam for known AAA. Risk Factors: Hyperlipidemia, past history of smoking. Limitations: Air/bowel gas.  Comparison Study: 04/27/2019 AAA duplex- Abdominal Aorta: The largest aortic                   diameter remains essentially unchanged compared to prior exam.                    Previous diameter measurement was 2.7 cm obtained on                   03/05/2017.  Performing Technologist: Gertie Fey MHA, RDMS, RVT, RDCS  Examination Guidelines: A complete evaluation includes B-mode imaging, spectral Doppler, color Doppler, and power Doppler as needed of all accessible portions of each vessel. Bilateral testing is considered an integral part of a complete examination. Limited examinations for reoccurring indications may be performed as noted.  Abdominal Aorta Findings: +-----------+-------+----------+----------+--------+--------+--------+ Location   AP (cm)Trans (cm)PSV (cm/s)WaveformThrombusComments +-----------+-------+----------+----------+--------+--------+--------+ Proximal   1.66   1.49      71                                 +-----------+-------+----------+----------+--------+--------+--------+ Mid        1.55   1.36      63                                 +-----------+-------+----------+----------+--------+--------+--------+ Distal     2.81   2.86      59                                 +-----------+-------+----------+----------+--------+--------+--------+ RT CIA Prox0.8    1.1       90        biphasic                 +-----------+-------+----------+----------+--------+--------+--------+ LT CIA Prox1.1    1.2       68        biphasic                 +-----------+-------+----------+----------+--------+--------+--------+  Summary: Abdominal Aorta: The largest aortic diameter remains essentially unchanged compared to prior exam. Previous diameter measurement was 2.7 cm obtained on 04/27/2019.  *See table(s) above for measurements and observations.  Electronically signed by Carolynn Sayers on 03/19/2023 at 2:41:45 PM.    Final     Recent Labs: Lab Results  Component Value Date   WBC 6.6 04/14/2022   HGB 10.4 (L) 04/14/2022   PLT 348 04/14/2022   NA 140 04/14/2022   K 4.8 04/14/2022   CL 103 04/14/2022   CO2 31  04/14/2022   GLUCOSE 76 04/14/2022   BUN 28 (H) 04/14/2022   CREATININE 0.92 04/14/2022   BILITOT 0.4 04/14/2022   ALKPHOS 48 07/21/2019   AST 15 04/14/2022   ALT 12 04/14/2022   PROT 7.0 04/14/2022   ALBUMIN 4.2 07/21/2019   CALCIUM 10.5 (H) 04/14/2022   GFRAA 78 10/10/2019    Speciality Comments: No specialty comments available.  Procedures:  No procedures performed Allergies: Bee venom, Wasp venom, Doxycycline, Lovastatin, and Naproxen   Assessment / Plan:     Visit Diagnoses: Autoimmune disease (HCC) - +ANA 1:40, ds DNA negative Raynauds , history of skin lupus biopsy proven by Dr. Nicholas Lose per patient: She denies any recurrence of skin lupus rash.  Her disease is in remission.  Raynauds is currently not active.  She continues to have dry mouth and dry eye symptoms which are manageable with over-the-counter products.  Raynaud's disease without gangrene-she had good capillary refill with no nailbed capillary changes or sclerodactyly.  Keeping core temperature warm and warm clothing was discussed.  Primary osteoarthritis of both hands-she complains of discomfort in her right index finger DIP joint.  Patient states that she will call us to schedule for cortisone injection.  Joint protection muscle strengthening was discussed.  A handout on hand exercises was given.  Trigger middle finger of right hand - Resolved.  History of total hip replacement, right - Dr. Turner Daniels.  She has intermittent discomfort.  Primary osteoarthritis of both feet - Chronic pain secondary to neuropathy.  She is on gabapentin.  Neck stiffness-she has some limitation with range of motion of the cervical spine.  A handout on neck exercises was given.  Spondylosis of lumbar spine - She had surgery in the past.  She will follow-up with Dr. Yevette Edwards.  Followed by pain management.  She gets injections as needed.  Osteopenia of multiple sites-followed by her PCP.  Idiopathic peripheral neuropathy-she has not had a  [place].  Aortic ectasia, abdominal (HCC)  Other fatigue  History of COPD - Followed by Dr. Francine Graven.  Former smoker  Orders: No orders of the defined types were placed in this encounter.  No orders of the defined types were placed in this encounter.    Follow-Up Instructions: Return in about 1 year (around 04/13/2024) for Osteoarthritis.   Pollyann Savoy, MD  Note - This record has been created using Animal nutritionist.  Chart creation errors have been sought, but may not always  have been located. Such creation errors do not reflect on  the standard of medical care.

## 2023-04-12 DIAGNOSIS — M47816 Spondylosis without myelopathy or radiculopathy, lumbar region: Secondary | ICD-10-CM | POA: Diagnosis not present

## 2023-04-12 DIAGNOSIS — M7061 Trochanteric bursitis, right hip: Secondary | ICD-10-CM | POA: Diagnosis not present

## 2023-04-12 DIAGNOSIS — G4700 Insomnia, unspecified: Secondary | ICD-10-CM | POA: Diagnosis not present

## 2023-04-12 DIAGNOSIS — G894 Chronic pain syndrome: Secondary | ICD-10-CM | POA: Diagnosis not present

## 2023-04-14 ENCOUNTER — Ambulatory Visit: Payer: Medicare Other | Attending: Rheumatology | Admitting: Rheumatology

## 2023-04-14 ENCOUNTER — Encounter: Payer: Self-pay | Admitting: Rheumatology

## 2023-04-14 VITALS — BP 103/69 | HR 76 | Resp 13 | Ht 64.0 in | Wt 131.8 lb

## 2023-04-14 DIAGNOSIS — Z8709 Personal history of other diseases of the respiratory system: Secondary | ICD-10-CM | POA: Diagnosis not present

## 2023-04-14 DIAGNOSIS — M47816 Spondylosis without myelopathy or radiculopathy, lumbar region: Secondary | ICD-10-CM

## 2023-04-14 DIAGNOSIS — M19041 Primary osteoarthritis, right hand: Secondary | ICD-10-CM | POA: Diagnosis not present

## 2023-04-14 DIAGNOSIS — Z87891 Personal history of nicotine dependence: Secondary | ICD-10-CM

## 2023-04-14 DIAGNOSIS — M19042 Primary osteoarthritis, left hand: Secondary | ICD-10-CM

## 2023-04-14 DIAGNOSIS — M359 Systemic involvement of connective tissue, unspecified: Secondary | ICD-10-CM

## 2023-04-14 DIAGNOSIS — I7300 Raynaud's syndrome without gangrene: Secondary | ICD-10-CM

## 2023-04-14 DIAGNOSIS — M436 Torticollis: Secondary | ICD-10-CM | POA: Diagnosis not present

## 2023-04-14 DIAGNOSIS — Z96641 Presence of right artificial hip joint: Secondary | ICD-10-CM

## 2023-04-14 DIAGNOSIS — R5383 Other fatigue: Secondary | ICD-10-CM

## 2023-04-14 DIAGNOSIS — I77811 Abdominal aortic ectasia: Secondary | ICD-10-CM | POA: Diagnosis not present

## 2023-04-14 DIAGNOSIS — M19071 Primary osteoarthritis, right ankle and foot: Secondary | ICD-10-CM

## 2023-04-14 DIAGNOSIS — M65331 Trigger finger, right middle finger: Secondary | ICD-10-CM | POA: Diagnosis not present

## 2023-04-14 DIAGNOSIS — G609 Hereditary and idiopathic neuropathy, unspecified: Secondary | ICD-10-CM | POA: Diagnosis not present

## 2023-04-14 DIAGNOSIS — Z79899 Other long term (current) drug therapy: Secondary | ICD-10-CM

## 2023-04-14 DIAGNOSIS — M19072 Primary osteoarthritis, left ankle and foot: Secondary | ICD-10-CM | POA: Insufficient documentation

## 2023-04-14 DIAGNOSIS — M8589 Other specified disorders of bone density and structure, multiple sites: Secondary | ICD-10-CM | POA: Diagnosis not present

## 2023-04-14 NOTE — Patient Instructions (Signed)
Cervical Strain and Sprain Rehab Ask your health care provider which exercises are safe for you. Do exercises exactly as told by your health care provider and adjust them as directed. It is normal to feel mild stretching, pulling, tightness, or discomfort as you do these exercises. Stop right away if you feel sudden pain or your pain gets worse. Do not begin these exercises until told by your health care provider. Stretching and range-of-motion exercises Cervical side bending  Using good posture, sit on a stable chair or stand up. Without moving your shoulders, slowly tilt your left / right ear to your shoulder until you feel a stretch in the neck muscles on the opposite side. You should be looking straight ahead. Hold for __________ seconds. Repeat with the other side of your neck. Repeat __________ times. Complete this exercise __________ times a day. Cervical rotation  Using good posture, sit on a stable chair or stand up. Slowly turn your head to the side as if you are looking over your left / right shoulder. Keep your eyes level with the ground. Stop when you feel a stretch along the side and the back of your neck. Hold for __________ seconds. Repeat this by turning to your other side. Repeat __________ times. Complete this exercise __________ times a day. Thoracic extension and pectoral stretch  Roll a towel or a small blanket so it is about 4 inches (10 cm) in diameter. Lie down on your back on a firm surface. Put the towel in the middle of your back across your spine. It should not be under your shoulder blades. Put your hands behind your head and let your elbows fall out to your sides. Hold for __________ seconds. Repeat __________ times. Complete this exercise __________ times a day. Strengthening exercises Upper cervical flexion  Lie on your back with a thin pillow behind your head or a small, rolled-up towel under your neck. Gently tuck your chin toward your chest and nod  your head down to look toward your feet. Do not lift your head off the pillow. Hold for __________ seconds. Release the tension slowly. Relax your neck muscles completely before you repeat this exercise. Repeat __________ times. Complete this exercise __________ times a day. Cervical extension  Stand about 6 inches (15 cm) away from a wall, with your back facing the wall. Place a soft object, about 6-8 inches (15-20 cm) in diameter, between the back of your head and the wall. A soft object could be a small pillow, a ball, or a folded towel. Gently tilt your head back and press into the soft object. Keep your jaw and forehead relaxed. Hold for __________ seconds. Release the tension slowly. Relax your neck muscles completely before you repeat this exercise. Repeat __________ times. Complete this exercise __________ times a day. Posture and body mechanics Body mechanics refer to the movements and positions of your body while you do your daily activities. Posture is part of body mechanics. Good posture and healthy body mechanics can help to relieve stress in your body's tissues and joints. Good posture means that your spine is in its natural S-curve position (your spine is neutral), your shoulders are pulled back slightly, and your head is not tipped forward. The following are general guidelines for using improved posture and body mechanics in your everyday activities. Sitting  When sitting, keep your spine neutral and keep your feet flat on the floor. Use a footrest, if needed, and keep your thighs parallel to the floor. Avoid rounding  your shoulders. Avoid tilting your head forward. When working at a desk or a computer, keep your desk at a height where your hands are slightly lower than your elbows. Slide your chair under your desk so you are close enough to maintain good posture. When working at a computer, place your monitor at a height where you are looking straight ahead and you do not have to  tilt your head forward or downward to look at the screen. Standing  When standing, keep your spine neutral and keep your feet about hip-width apart. Keep a slight bend in your knees. Your ears, shoulders, and hips should line up. When you do a task in which you stand in one place for a long time, place one foot up on a stable object that is 2-4 inches (5-10 cm) high, such as a footstool. This helps keep your spine neutral. Resting When lying down and resting, avoid positions that are most painful for you. Try to support your neck in a neutral position. You can use a contour pillow or a small rolled-up towel. Your pillow should support your neck but not push on it. This information is not intended to replace advice given to you by your health care provider. Make sure you discuss any questions you have with your health care provider. Document Revised: 08/31/2022 Document Reviewed: 11/17/2021 Elsevier Patient Education  2024 [employer] Hand Exercises Hand exercises can be helpful for almost anyone. They can strengthen your hands and improve flexibility and movement. The exercises can also increase blood flow to the hands. These results can make your work and daily tasks easier for you. Hand exercises can be especially helpful for people who have joint pain from arthritis or nerve damage from using their hands over and over. These exercises can also help people who injure a hand. Exercises Most of these hand exercises are gentle stretching and motion exercises. It is usually safe to do them often throughout the day. Warming up your hands before exercise may help reduce stiffness. You can do this with gentle massage or by placing your hands in warm water for 10-15 minutes. It is normal to feel some stretching, pulling, tightness, or mild discomfort when you begin new exercises. In time, this will improve. Remember to always be careful and stop right away if you feel sudden, very bad pain or your pain  gets worse. You want to get better and be safe. Ask your health care provider which exercises are safe for you. Do exercises exactly as told by your provider and adjust them as told. Do not begin these exercises until told by your provider. Knuckle bend or "claw" fist  Stand or sit with your arm, hand, and all five fingers pointed straight up. Make sure to keep your wrist straight. Gently bend your fingers down toward your palm until the tips of your fingers are touching your palm. Keep your big knuckle straight and only bend the small knuckles in your fingers. Hold this position for 10 seconds. Straighten your fingers back to your starting position. Repeat this exercise 5-10 times with each hand. Full finger fist  Stand or sit with your arm, hand, and all five fingers pointed straight up. Make sure to keep your wrist straight. Gently bend your fingers into your palm until the tips of your fingers are touching the middle of your palm. Hold this position for 10 seconds. Extend your fingers back to your starting position, stretching every joint fully. Repeat this exercise 5-10 times  with each hand. Straight fist  Stand or sit with your arm, hand, and all five fingers pointed straight up. Make sure to keep your wrist straight. Gently bend your fingers at the big knuckle, where your fingers meet your hand, and at the middle knuckle. Keep the knuckle at the tips of your fingers straight and try to touch the bottom of your palm. Hold this position for 10 seconds. Extend your fingers back to your starting position, stretching every joint fully. Repeat this exercise 5-10 times with each hand. Tabletop  Stand or sit with your arm, hand, and all five fingers pointed straight up. Make sure to keep your wrist straight. Gently bend your fingers at the big knuckle, where your fingers meet your hand, as far down as you can. Keep the small knuckles in your fingers straight. Think of forming a tabletop with  your fingers. Hold this position for 10 seconds. Extend your fingers back to your starting position, stretching every joint fully. Repeat this exercise 5-10 times with each hand. Finger spread  Place your hand flat on a table with your palm facing down. Make sure your wrist stays straight. Spread your fingers and thumb apart from each other as far as you can until you feel a gentle stretch. Hold this position for 10 seconds. Bring your fingers and thumb tight together again. Hold this position for 10 seconds. Repeat this exercise 5-10 times with each hand. Making circles  Stand or sit with your arm, hand, and all five fingers pointed straight up. Make sure to keep your wrist straight. Make a circle by touching the tip of your thumb to the tip of your index finger. Hold for 10 seconds. Then open your hand wide. Repeat this motion with your thumb and each of your fingers. Repeat this exercise 5-10 times with each hand. Thumb motion  Sit with your forearm resting on a table and your wrist straight. Your thumb should be facing up toward the ceiling. Keep your fingers relaxed as you move your thumb. Lift your thumb up as high as you can toward the ceiling. Hold for 10 seconds. Bend your thumb across your palm as far as you can, reaching the tip of your thumb for the small finger (pinkie) side of your palm. Hold for 10 seconds. Repeat this exercise 5-10 times with each hand. Grip strengthening  Hold a stress ball or other soft ball in the middle of your hand. Slowly increase the pressure, squeezing the ball as much as you can without causing pain. Think of bringing the tips of your fingers into the middle of your palm. All of your finger joints should bend when doing this exercise. Hold your squeeze for 10 seconds, then relax. Repeat this exercise 5-10 times with each hand. Contact a health care provider if: Your hand pain or discomfort gets much worse when you do an exercise. Your hand pain  or discomfort does not improve within 2 hours after you exercise. If you have either of these problems, stop doing these exercises right away. Do not do them again unless your provider says that you can. Get help right away if: You develop sudden, severe hand pain or swelling. If this happens, stop doing these exercises right away. Do not do them again unless your provider says that you can. This information is not intended to replace advice given to you by your health care provider. Make sure you discuss any questions you have with your health care provider. Document Revised: 05/12/2022  Document Reviewed: 05/12/2022 Elsevier Patient Education  2024 [employer].

## 2023-04-21 ENCOUNTER — Ambulatory Visit: Payer: Medicare Other | Admitting: Physician Assistant

## 2023-04-21 DIAGNOSIS — I77811 Abdominal aortic ectasia: Secondary | ICD-10-CM

## 2023-04-21 NOTE — Progress Notes (Signed)
Virtual Visit via Telephone Note   I connected with Janet Giles on 04/21/2023 by telephone and verified that I was speaking with the correct person using two identifiers. Patient was located at home. I am located at [facility] office.   The limitations of evaluation and management by telemedicine and the availability of in person appointments have been previously discussed with the patient and are documented in the patients chart. The patient expressed understanding and consented to proceed.  PCP: Clovis Riley, L.August Saucer, MD (Inactive)  Chief Complaint: follow up  History of Present Illness: Janet Giles is a 82 y.o. female with hx of AAA.   She was last seen in 2020 and at that time, she did have chronic back pain and had hx of back procedures in the past.  She did have some sharp umbilical pain and was told by PCP could be due to umbilical hernia.    She denies any new abdominal pain but has been having pain.  She states she has been having a pain that radiates down her abdomen around her belly button.  She states they cannot figure out why she is having this pain and why she scheduled to come back for u/s to evaluate AAA.  She has chronic back pain.  She denies any trouble with eating and denies any pain with eating.  She does not have a fear of eating.  She has had an 18 lb weight loss over the past year.  She does not do any more colonoscopies.  Her biggest complaint is her neuropathy of her feet.  She states that she does not get claudication and does not have non healing wounds.  She does have some pain in her feet at night due to the neuropathy.    She states that her feet do not look like feet with neuropathy.  She has seen neurology for this.  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  She states that Lyrica did not work for her and she is currently on Gabapentin.    She does not take a daily asa.  She is not on a statin due to muscle aches and bone pain.  Her blood  pressure is well controlled.   There is family hx of AAA with her 76 y/o nephew who recently had his AAA fixed and is doing well.    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  She has hx of right hip arthroplasty 2016.  L4-5, L5-S1 laminectomy and disckectomy with posterior lumbar fusion 2002.   The pt is not on a statin for cholesterol management.  The pt is not on a daily aspirin.   Other AC:  none The pt is not on medication for hypertension.   The pt is not diabetic.   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 Alphonzo Grieve   Neuromuscular disorder [facility])    neuropathy in feet   PONV (postoperative nausea and vomiting)    Primary osteoarthritis of both feet 08/06/2016   Primary osteoarthritis of both hands 08/06/2016   Primary osteoarthritis of right hip 03/24/2015   Raynaud's disease without gangrene 08/06/2016   Shortness of breath dyspnea    uses inhaler occ   Systemic lupus erythematosus (HCC)     Past Surgical History:  Procedure Laterality Date   ABDOMINAL HYSTERECTOMY     BACK SURGERY  02   L4-L5- fusion   BRONCHIAL  BRUSHINGS  08/07/2019   Procedure: BRONCHIAL BRUSHINGS;  Surgeon: Steffanie Dunn, DO;  Location: [facility];  Service: Endoscopy;;   BRONCHIAL WASHINGS  08/07/2019   Procedure: BRONCHIAL WASHINGS;  Surgeon: Steffanie Dunn, DO;  Location: [facility];  Service: Endoscopy;;   COLONOSCOPY WITH PROPOFOL N/A 03/17/2016   Procedure: COLONOSCOPY WITH PROPOFOL;  Surgeon: Charolett Bumpers, MD;  Location: [facility];  Service: Endoscopy;  Laterality: N/A;   ESOPHAGOGASTRODUODENOSCOPY (EGD) WITH PROPOFOL N/A 03/17/2016   Procedure: ESOPHAGOGASTRODUODENOSCOPY (EGD) WITH PROPOFOL;  Surgeon: Charolett Bumpers, MD;  Location: [facility];  Service: Endoscopy;  Laterality: N/A;   JOINT REPLACEMENT     TONSILLECTOMY AND ADENOIDECTOMY     TOTAL HIP ARTHROPLASTY Right 03/25/2015   Procedure: TOTAL HIP ARTHROPLASTY ANTERIOR APPROACH;  Surgeon: Gean Birchwood, MD;  Location: [facility];  Service: Orthopedics;  Laterality: Right;   VIDEO BRONCHOSCOPY N/A 08/07/2019   Procedure: VIDEO BRONCHOSCOPY WITHOUT FLUORO;  Surgeon: Steffanie Dunn, DO;  Location: [facility];  Service: Endoscopy;  Laterality: N/A;     12 system ROS was negative unless otherwise noted in HPI   Observations/Objective: 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: Abdominal Aorta Findings:  +-----------+-------+----------+----------+--------+--------+--------+  Location  AP (cm)Trans (cm)PSV (cm/s)WaveformThrombusComments  +-----------+-------+----------+----------+--------+--------+--------+  Proximal  1.66   1.49      71                                  +-----------+-------+----------+----------+--------+--------+--------+  Mid       1.55   1.36      63                                  +-----------+-------+----------+----------+--------+--------+--------+  Distal    2.81   2.86      59                                  +-----------+-------+----------+----------+--------+--------+--------+  RT CIA Prox0.8    1.1       90         biphasic                  +-----------+-------+----------+----------+--------+--------+--------+  LT CIA Prox1.1    1.2       68        biphasic                  +-----------+-------+----------+----------+--------+--------+--------+   Summary:  Abdominal Aorta: The largest  aortic diameter remains essentially unchanged compared to prior exam. Previous diameter measurement was 2.7 cm obtained on 04/27/2019.   Previous AAA duplex 04/27/2019: Abdominal Aorta Findings:  +-----------+-------+----------+----------+--------+--------+--------+  Location  AP (cm)Trans (cm)PSV (cm/s)WaveformThrombusComments  +-----------+-------+----------+----------+--------+--------+--------+  Proximal  1.66   1.80      57                                  +-----------+-------+----------+----------+--------+--------+--------+  Mid       1.60   1.74      80        biphasic                  +-----------+-------+----------+----------+--------+--------+--------+  Distal    2.73   2.61      86        biphasic                  +-----------+-------+----------+----------+--------+--------+--------+  RT CIA Prox0.8    0.7       111       biphasic                  +-----------+-------+----------+----------+--------+--------+--------+  LT CIA Prox1.2    1.2       91        biphasic                  +-----------+-------+----------+----------+--------+--------+--------+   Summary:  Abdominal Aorta: The largest aortic diameter remains essentially unchanged compared to prior exam. Previous diameter measurement was 2.7 cm obtained on 03/05/2017.   Assessment and Plan: 82 y.o. female with hx of AAA  AAA -AAA essentially unchanged from 4 years ago.  She continues to have some abdominal pain.  She does not have sx consistent with mesenteric ischemia.    Neuropathy -she does not have any claudication or non healing wounds.  We discussed getting ABI for baseline and she  would like to come in for this.  Will have scheduler call to schedule this.   Follow Up Instructions:   Follow up 2 years with AAA duplex Follow up in the next few weeks for ABI   I discussed the assessment and treatment plan with the patient. The patient was provided an opportunity to ask questions and all were answered. The patient agreed with the plan and demonstrated an understanding of the instructions.   The patient was advised to call back or seek an in-person evaluation if the symptoms worsen or if the condition fails to improve as anticipated.  I spent 12 minutes with the patient via telephone encounter.   Signed, Doreatha Massed, PA-C [facility] Office: [phone]  04/21/2023, 8:51 AM

## 2023-04-22 ENCOUNTER — Other Ambulatory Visit: Payer: Self-pay

## 2023-04-22 DIAGNOSIS — G629 Polyneuropathy, unspecified: Secondary | ICD-10-CM

## 2023-05-07 DIAGNOSIS — N179 Acute kidney failure, unspecified: Secondary | ICD-10-CM | POA: Diagnosis not present

## 2023-05-12 HISTORY — PX: ABLATION: SHX5711

## 2023-06-03 NOTE — Progress Notes (Signed)
Patient ID: Janet Giles, female   DOB: July 03, 1940, 83 y.o.   MRN: [id number]  Reason for Consult: Follow-up   Referred by No ref. provider found  Subjective:     HPI  Janet Giles is a 83 y.o. female with a history of a small AAA who sees [place] every other year for duplex evaluation.  It has been stable at 2.8 cm's.  At her last visit she describes some foot pain and chronic neuropathy and was advised follow-up in the next few months with an ABI.  She denies claudication, rest pain or ulceration.  All of her pain is pins-and-needles type neuropathy pain.  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 Channing Mutters   Degenerative joint disease of spine    buttocks pain -remains a problem   Family history of adverse reaction to anesthesia    one sister nasea and vomiting   Former smoker 08/13/2016   GERD (gastroesophageal reflux disease)    occ   High risk medication use 08/06/2016   History of total hip replacement, right 08/13/2016   Hyperlipidemia    Hypothyroidism    Idiopathic peripheral neuropathy 08/13/2016   Lupus    Dr. 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 08/06/2016   Shortness of breath dyspnea    uses inhaler occ   Systemic lupus erythematosus (HCC)    Family History  Problem Relation Age of Onset   Diabetes Mother    Heart disease Mother    Heart attack Mother    Bleeding Disorder Father     Peptic Ulcer Father    Heart disease Sister    Heart attack Sister    Cancer Sister    Deep vein thrombosis Sister    Diabetes Sister    Hyperlipidemia Sister    Hypertension Sister    Varicose Veins Sister    AAA (abdominal aortic aneurysm) Sister    Bleeding Disorder Sister    Breast cancer Sister    Heart disease Brother    Heart attack Brother    Hypertension Brother    Past Surgical History:  Procedure Laterality Date   ABDOMINAL HYSTERECTOMY     BACK SURGERY  02   L4-L5- fusion   BRONCHIAL BRUSHINGS  08/07/2019   Procedure: BRONCHIAL BRUSHINGS;  Surgeon: Steffanie Dunn, DO;  Location: [facility];  Service: Endoscopy;;   BRONCHIAL WASHINGS  08/07/2019   Procedure: BRONCHIAL WASHINGS;  Surgeon: Steffanie Dunn, DO;  Location: [facility];  Service: Endoscopy;;   COLONOSCOPY WITH PROPOFOL N/A 03/17/2016   Procedure: COLONOSCOPY WITH PROPOFOL;  Surgeon: Charolett Bumpers, MD;  Location: [facility];  Service: Endoscopy;  Laterality: N/A;   ESOPHAGOGASTRODUODENOSCOPY (EGD) WITH PROPOFOL N/A 03/17/2016   Procedure: ESOPHAGOGASTRODUODENOSCOPY (EGD) WITH PROPOFOL;  Surgeon: Charolett Bumpers, MD;  Location: [facility];  Service: Endoscopy;  Laterality: N/A;   JOINT REPLACEMENT     TONSILLECTOMY AND ADENOIDECTOMY  TOTAL HIP ARTHROPLASTY Right 03/25/2015   Procedure: TOTAL HIP ARTHROPLASTY ANTERIOR APPROACH;  Surgeon: Gean Birchwood, MD;  Location: [facility];  Service: Orthopedics;  Laterality: Right;   VIDEO BRONCHOSCOPY N/A 08/07/2019   Procedure: VIDEO BRONCHOSCOPY WITHOUT FLUORO;  Surgeon: Steffanie Dunn, DO;  Location: [facility];  Service: Endoscopy;  Laterality: N/A;    Short Social History:  Social History   Tobacco Use   Smoking status: Former    Current packs/day: 0.00    Average packs/day: 1.5 packs/day for 30.0 years (45.0 ttl pk-yrs)    Types: Cigarettes    Start date: 05/11/1970    Quit date: 05/11/2000    Years since quitting: 23.0    Passive exposure: Never    Smokeless tobacco: Never  Substance Use Topics   Alcohol use: Never    Alcohol/week: 0.0 standard drinks of alcohol    Allergies  Allergen Reactions   Bee Venom Anaphylaxis   Wasp Venom Shortness Of Breath and Swelling   Doxycycline Nausea And Vomiting   Lovastatin     Other reaction(s): muscle aches   Naproxen     Other reaction(s): nausea    Current Outpatient Medications  Medication Sig Dispense Refill   albuterol (VENTOLIN HFA) 108 (90 Base) MCG/ACT inhaler Inhale 1 puff into the lungs every 6 (six) hours as needed for wheezing or shortness of breath. 6.7 g 6   ALPHA LIPOIC ACID PO Take by mouth.     Ferrous Sulfate (IRON) 325 (65 Fe) MG TABS Take 1 tablet by mouth.     gabapentin (NEURONTIN) 400 MG capsule Take 400 mg by mouth 2 (two) times daily.     Glycopyrrolate-Formoterol (BEVESPI AEROSPHERE) 9-4.8 MCG/ACT AERO Inhale 2 puffs into the lungs 2 (two) times daily. 10.7 g 11   HYDROcodone-acetaminophen (NORCO/VICODIN) 5-325 MG tablet Take 1 tablet by mouth 2 (two) times daily as needed.     levothyroxine (SYNTHROID, LEVOTHROID) 75 MCG tablet Take 75 mcg by mouth at bedtime.     Multiple Vitamin (ONE-A-DAY ESSENTIAL PO) Take by mouth.     vitamin B-12 (CYANOCOBALAMIN) 500 MCG tablet Take 500 mcg by mouth every evening.     Calcium Carb-Cholecalciferol (CALCIUM + D3 PO) Take 1 tablet by mouth daily. (Patient not taking: Reported on 04/14/2023)     No current facility-administered medications for this visit.    REVIEW OF SYSTEMS  Positive for shortness of breath with activity  All other systems were reviewed and are negative     Objective:  Objective   Vitals:   06/04/23 1024  BP: 127/69  Pulse: 66  Resp: 20  Temp: 97.8 F (36.6 C)  SpO2: 96%  Weight: 134 lb (60.8 kg)  Height: 5\' 4"  (1.626 m)   Body mass index is 23 kg/m.  Physical Exam General: no acute distress Cardiac: hemodynamically stable Pulm: normal work of breathing Abdomen: non-tender, no  pulsatile mass Neuro: alert, no focal deficit Extremities: no edema, mild discoloration of all 10 toes, no wounds Vascular:   Right: Palpable DP  Left: Palpable DP   Data: ABI +---------+------------------+-----+---------+--------+  Right   Rt Pressure (mmHg)IndexWaveform Comment   +---------+------------------+-----+---------+--------+  Brachial 138                                       +---------+------------------+-----+---------+--------+  PTA     143  1.04 triphasic          +---------+------------------+-----+---------+--------+  DP      145               1.05 triphasic          +---------+------------------+-----+---------+--------+  Great Toe105               0.76                    +---------+------------------+-----+---------+--------+   +---------+------------------+-----+---------+-------+  Left    Lt Pressure (mmHg)IndexWaveform Comment  +---------+------------------+-----+---------+-------+  Brachial 130                                      +---------+------------------+-----+---------+-------+  PTA     136               0.99 triphasic         +---------+------------------+-----+---------+-------+  DP      136               0.99 triphasic         +---------+------------------+-----+---------+-------+  Cletis Media               0.81                   +---------+------------------+-----+---------+-------+   Abdominal aortic ultrasound from 03/19/2023 Abdominal Aorta Findings:  +-----------+-------+----------+----------+--------+--------+--------+  Location  AP (cm)Trans (cm)PSV (cm/s)WaveformThrombusComments  +-----------+-------+----------+----------+--------+--------+--------+  Proximal  1.66   1.49      71                                  +-----------+-------+----------+----------+--------+--------+--------+  Mid       1.55   1.36      63                                   +-----------+-------+----------+----------+--------+--------+--------+  Distal    2.81   2.86      59                                  +-----------+-------+----------+----------+--------+--------+--------+  RT CIA Prox0.8    1.1       90        biphasic                  +-----------+-------+----------+----------+--------+--------+--------+  LT CIA Prox1.1    1.2       68        biphasic                  +-----------+-------+----------+----------+--------+--------+--------+   Most recent CMP reviewed, creatinine 0.92      Assessment/Plan:     Janet Giles is a 83 y.o. female with a small AAA and neuropathy.  I explained that her perfusion is normal to her bilateral lower extremities and the issues with her feet and toes are most likely due to neuropathy.  She has been on chronic gabapentin.  Continue with original scheduled follow-up for 2 years with a repeat AAA duplex      Daria Pastures MD [facility]

## 2023-06-04 ENCOUNTER — Encounter: Payer: Self-pay | Admitting: Vascular Surgery

## 2023-06-04 ENCOUNTER — Ambulatory Visit (INDEPENDENT_AMBULATORY_CARE_PROVIDER_SITE_OTHER): Payer: Medicare Other | Admitting: Vascular Surgery

## 2023-06-04 ENCOUNTER — Ambulatory Visit (HOSPITAL_COMMUNITY)
Admission: RE | Admit: 2023-06-04 | Discharge: 2023-06-04 | Disposition: A | Discharge status: Home / Self Care | Payer: Medicare Other | Source: Ambulatory Visit | Attending: Vascular Surgery | Admitting: Vascular Surgery

## 2023-06-04 VITALS — BP 127/69 | HR 66 | Temp 97.8°F | Resp 20 | Ht 64.0 in | Wt 134.0 lb

## 2023-06-04 DIAGNOSIS — G629 Polyneuropathy, unspecified: Secondary | ICD-10-CM | POA: Insufficient documentation

## 2023-06-04 DIAGNOSIS — I7143 Infrarenal abdominal aortic aneurysm, without rupture: Secondary | ICD-10-CM

## 2023-06-04 LAB — VAS US ABI WITH/WO TBI
Left ABI: 0.99
Right ABI: 1.05

## 2023-06-14 DIAGNOSIS — M7061 Trochanteric bursitis, right hip: Secondary | ICD-10-CM | POA: Diagnosis not present

## 2023-06-14 DIAGNOSIS — G4700 Insomnia, unspecified: Secondary | ICD-10-CM | POA: Diagnosis not present

## 2023-06-14 DIAGNOSIS — M47816 Spondylosis without myelopathy or radiculopathy, lumbar region: Secondary | ICD-10-CM | POA: Diagnosis not present

## 2023-06-14 DIAGNOSIS — G894 Chronic pain syndrome: Secondary | ICD-10-CM | POA: Diagnosis not present

## 2023-06-18 DIAGNOSIS — R399 Unspecified symptoms and signs involving the genitourinary system: Secondary | ICD-10-CM | POA: Diagnosis not present

## 2023-06-18 DIAGNOSIS — R8290 Unspecified abnormal findings in urine: Secondary | ICD-10-CM | POA: Diagnosis not present

## 2023-06-30 DIAGNOSIS — M7061 Trochanteric bursitis, right hip: Secondary | ICD-10-CM | POA: Diagnosis not present

## 2023-07-15 ENCOUNTER — Ambulatory Visit
Admission: RE | Admit: 2023-07-15 | Discharge: 2023-07-15 | Disposition: A | Discharge status: Home / Self Care | Payer: Medicare Other | Source: Ambulatory Visit | Attending: Internal Medicine | Admitting: Internal Medicine

## 2023-07-15 ENCOUNTER — Other Ambulatory Visit: Payer: Self-pay

## 2023-07-15 DIAGNOSIS — J449 Chronic obstructive pulmonary disease, unspecified: Secondary | ICD-10-CM | POA: Diagnosis not present

## 2023-07-15 DIAGNOSIS — M8580 Other specified disorders of bone density and structure, unspecified site: Secondary | ICD-10-CM

## 2023-07-15 DIAGNOSIS — E2839 Other primary ovarian failure: Secondary | ICD-10-CM | POA: Diagnosis not present

## 2023-07-15 DIAGNOSIS — M8588 Other specified disorders of bone density and structure, other site: Secondary | ICD-10-CM | POA: Diagnosis not present

## 2023-07-15 DIAGNOSIS — N958 Other specified menopausal and perimenopausal disorders: Secondary | ICD-10-CM | POA: Diagnosis not present

## 2023-07-15 MED ORDER — BEVESPI AEROSPHERE 9-4.8 MCG/ACT IN AERO: 2.0000 | INHALATION_SPRAY | Freq: Two times a day (BID) | RESPIRATORY_TRACT | 0 refills | Status: AC

## 2023-08-10 DIAGNOSIS — M7061 Trochanteric bursitis, right hip: Secondary | ICD-10-CM | POA: Diagnosis not present

## 2023-08-10 DIAGNOSIS — G894 Chronic pain syndrome: Secondary | ICD-10-CM | POA: Diagnosis not present

## 2023-08-10 DIAGNOSIS — G4700 Insomnia, unspecified: Secondary | ICD-10-CM | POA: Diagnosis not present

## 2023-08-10 DIAGNOSIS — M47816 Spondylosis without myelopathy or radiculopathy, lumbar region: Secondary | ICD-10-CM | POA: Diagnosis not present

## 2023-09-22 ENCOUNTER — Ambulatory Visit: Admission: EM | Admit: 2023-09-22 | Discharge: 2023-09-22 | Disposition: A | Discharge status: Home / Self Care

## 2023-09-22 ENCOUNTER — Other Ambulatory Visit: Payer: Self-pay

## 2023-09-22 ENCOUNTER — Emergency Department (HOSPITAL_BASED_OUTPATIENT_CLINIC_OR_DEPARTMENT_OTHER)
Admission: EM | Admit: 2023-09-22 | Discharge: 2023-09-22 | Disposition: A | Discharge status: Home / Self Care | Attending: Emergency Medicine | Admitting: Emergency Medicine

## 2023-09-22 ENCOUNTER — Emergency Department (HOSPITAL_BASED_OUTPATIENT_CLINIC_OR_DEPARTMENT_OTHER): Admitting: Radiology

## 2023-09-22 DIAGNOSIS — J189 Pneumonia, unspecified organism: Secondary | ICD-10-CM

## 2023-09-22 DIAGNOSIS — R031 Nonspecific low blood-pressure reading: Secondary | ICD-10-CM | POA: Diagnosis not present

## 2023-09-22 DIAGNOSIS — R739 Hyperglycemia, unspecified: Secondary | ICD-10-CM | POA: Diagnosis not present

## 2023-09-22 DIAGNOSIS — R0602 Shortness of breath: Secondary | ICD-10-CM

## 2023-09-22 DIAGNOSIS — J449 Chronic obstructive pulmonary disease, unspecified: Secondary | ICD-10-CM | POA: Insufficient documentation

## 2023-09-22 DIAGNOSIS — R051 Acute cough: Secondary | ICD-10-CM

## 2023-09-22 LAB — COMPREHENSIVE METABOLIC PANEL WITH GFR
ALT: 13 U/L (ref 0–44)
AST: 22 U/L (ref 15–41)
Albumin: 4.1 g/dL (ref 3.5–5.0)
Alkaline Phosphatase: 50 U/L (ref 38–126)
Anion gap: 11 (ref 5–15)
BUN: 19 mg/dL (ref 8–23)
CO2: 24 mmol/L (ref 22–32)
Calcium: 9.8 mg/dL (ref 8.9–10.3)
Chloride: 102 mmol/L (ref 98–111)
Creatinine, Ser: 1.09 mg/dL — ABNORMAL HIGH (ref 0.44–1.00)
GFR, Estimated: 50 mL/min — ABNORMAL LOW (ref >60)
Glucose, Bld: 153 mg/dL — ABNORMAL HIGH (ref 70–99)
Potassium: 4.3 mmol/L (ref 3.5–5.1)
Sodium: 137 mmol/L (ref 135–145)
Total Bilirubin: 0.4 mg/dL (ref 0.0–1.2)
Total Protein: 6.6 g/dL (ref 6.5–8.1)

## 2023-09-22 LAB — CBC WITH DIFFERENTIAL/PLATELET
Abs Immature Granulocytes: 0.01 10*3/uL (ref 0.00–0.07)
Basophils Absolute: 0 10*3/uL (ref 0.0–0.1)
Basophils Relative: 1 %
Eosinophils Absolute: 0.1 10*3/uL (ref 0.0–0.5)
Eosinophils Relative: 3 %
HCT: 30.5 % — ABNORMAL LOW (ref 36.0–46.0)
Hemoglobin: 10.2 g/dL — ABNORMAL LOW (ref 12.0–15.0)
Immature Granulocytes: 0 %
Lymphocytes Relative: 30 %
Lymphs Abs: 1.5 10*3/uL (ref 0.7–4.0)
MCH: 32.2 pg (ref 26.0–34.0)
MCHC: 33.4 g/dL (ref 30.0–36.0)
MCV: 96.2 fL (ref 80.0–100.0)
Monocytes Absolute: 0.5 10*3/uL (ref 0.1–1.0)
Monocytes Relative: 11 %
Neutro Abs: 2.7 10*3/uL (ref 1.7–7.7)
Neutrophils Relative %: 55 %
Platelets: 323 10*3/uL (ref 150–400)
RBC: 3.17 MIL/uL — ABNORMAL LOW (ref 3.87–5.11)
RDW: 12.6 % (ref 11.5–15.5)
WBC: 4.9 10*3/uL (ref 4.0–10.5)
nRBC: 0 % (ref 0.0–0.2)

## 2023-09-22 LAB — POCT INFLUENZA A/B
Influenza A, POC: NEGATIVE
Influenza B, POC: NEGATIVE

## 2023-09-22 LAB — POC SARS CORONAVIRUS 2 AG -  ED: SARS Coronavirus 2 Ag: NEGATIVE

## 2023-09-22 LAB — GROUP A STREP BY PCR: Group A Strep by PCR: NOT DETECTED

## 2023-09-22 LAB — LACTIC ACID, PLASMA: Lactic Acid, Venous: 1.1 mmol/L (ref 0.5–1.9)

## 2023-09-22 MED ORDER — AZITHROMYCIN 250 MG PO TABS: ORAL_TABLET | ORAL | 0 refills | Status: DC

## 2023-09-22 MED ORDER — AZITHROMYCIN 1 G PO PACK
1.0000 g | PACK | Freq: Once | ORAL | Status: AC
  Administered 2023-09-22: 1 g via ORAL
  Filled 2023-09-22: qty 1

## 2023-09-22 MED ORDER — SODIUM CHLORIDE 0.9 % IV SOLN
1.0000 g | Freq: Once | INTRAVENOUS | Status: AC
  Administered 2023-09-22: 1 g via INTRAVENOUS
  Filled 2023-09-22: qty 10

## 2023-09-22 NOTE — Discharge Instructions (Addendum)
 Please go to the nearest ER for further evaluation. I'm concerned you may have pneumonia with a serious infection causing your blood pressure to be lower than normal.

## 2023-09-22 NOTE — ED Notes (Signed)
Pt is in radiology.

## 2023-09-22 NOTE — Discharge Instructions (Addendum)
Contact a health care provider if: You have a fever. You have trouble sleeping because you cannot control your cough with cough medicine. Get help right away if: Your shortness of breath becomes worse. Your chest pain increases. Your sickness becomes worse, especially if you are an older adult or have a weak immune system. You cough up blood. These symptoms may be an emergency. Get help right away. Call 911. Do not wait to see if the symptoms will go away. Do not drive yourself to the hospital. 

## 2023-09-22 NOTE — ED Triage Notes (Signed)
"  This started Monday evening with a terrible sore throat, then dull ha (checked for COVID19-Negative test) but then started with low grade Fever (99-100) & this morning a cough (I do have COPD)".

## 2023-09-22 NOTE — ED Triage Notes (Signed)
 Called patient, notified to return to inside Urgent Care for room. Maude Sorrel CMA

## 2023-09-22 NOTE — ED Provider Notes (Signed)
  EMERGENCY DEPARTMENT AT [facility] Provider Note   CSN: [id number] Arrival date & time: 09/22/23  1252     History  Chief Complaint  Patient presents with   Shortness of Breath    Janet Giles is a 83 y.o. female  who presents for evaluation of fever, sore throat and cough. She has a past medical history of COPD, AAA, bilateral bronchiectasis Raynaud's disease and arthritis.  The patient states that she has had 3 days of progressively worsening sore throat fatigue she took a home COVID test that was negative.  She states that today she woke up and was feeling very bad.  She had a temperature of 100.7 at home.  She went into an urgent care where they heard abnormal sounds in the left lung and sent her to the emergency department for further evaluation.  I have eczema right here and my triamcinolone  is wearing off what    Shortness of Breath      Home Medications Prior to Admission medications   Medication Sig Start Date End Date Taking? Authorizing Provider  albuterol  (VENTOLIN  HFA) 108 (90 Base) MCG/ACT inhaler Inhale 1 puff into the lungs every 6 (six) hours as needed for wheezing or shortness of breath. 10/09/21   Wilfredo Hanly, MD  ALPHA LIPOIC ACID PO Take by mouth.    [provider]  Calcium Carb-Cholecalciferol (CALCIUM + D3 PO) Take 1 tablet by mouth daily. Patient not taking: Reported on 04/14/2023    [provider]  Ferrous Sulfate (IRON) 325 (65 Fe) MG TABS Take 1 tablet by mouth.    [provider]  gabapentin  (NEURONTIN ) 400 MG capsule Take 400 mg by mouth 2 (two) times daily. 03/18/22   [provider]  Glycopyrrolate-Formoterol  (BEVESPI  AEROSPHERE) 9-4.8 MCG/ACT AERO Inhale 2 puffs into the lungs 2 (two) times daily. 07/15/23 10/13/23  Wilfredo Hanly, MD  HYDROcodone -acetaminophen  (NORCO/VICODIN) 5-325 MG tablet Take 1 tablet by mouth 2 (two) times daily as needed. 09/26/21   [provider]  levothyroxine  (SYNTHROID , LEVOTHROID) 75 MCG tablet Take 75 mcg by mouth at bedtime. 05/18/13   [provider]  Multiple Vitamin (ONE-A-DAY ESSENTIAL PO) Take by mouth.    [provider]  nitrofurantoin, macrocrystal-monohydrate, (MACROBID) 100 MG capsule Take 100 mg by mouth 2 (two) times daily. 06/18/23   [provider]  vitamin B-12 (CYANOCOBALAMIN) 500 MCG tablet Take 500 mcg by mouth every evening.    [provider]      Allergies    Bee venom, Wasp venom, Doxycycline, Lovastatin, and Naproxen    Review of Systems   Review of Systems  Respiratory:  Positive for shortness of breath.     Physical Exam Updated Vital Signs BP 116/64 (BP Location: Right Arm)   Pulse 93   Temp 98.1 F (36.7 C)   Resp 16   Wt 61.2 kg   SpO2 94%   BMI 23.17 kg/m  Physical Exam Vitals and nursing note reviewed.  Constitutional:      General: She is not in acute distress.    Appearance: She is well-developed. She is ill-appearing. She is not toxic-appearing or diaphoretic.  HENT:     Head: Normocephalic and atraumatic.     Right Ear: External ear normal.     Left Ear: External ear normal.     Nose: Nose normal.     Mouth/Throat:     Mouth: Mucous membranes are moist.     Tongue: No  lesions.     Palate: No mass.     Pharynx: Oropharynx is clear. Uvula midline. Posterior oropharyngeal erythema present. No pharyngeal swelling, oropharyngeal exudate or uvula swelling.     Tonsils: No tonsillar exudate or tonsillar abscesses.  Eyes:     General: No scleral icterus.    Conjunctiva/sclera: Conjunctivae normal.  Cardiovascular:     Rate and Rhythm: Normal rate and regular rhythm.     Heart sounds: Normal heart sounds. No murmur heard.    No friction rub. No gallop.  Pulmonary:     Effort: Pulmonary effort is normal. No respiratory distress.     Breath sounds: Examination of the right-lower field reveals rales. Examination of the left-lower field reveals  rales. Rales present. No decreased breath sounds, wheezing or rhonchi.  Abdominal:     General: Bowel sounds are normal. There is no distension.     Palpations: Abdomen is soft. There is no mass.     Tenderness: There is no abdominal tenderness. There is no guarding.  Musculoskeletal:     Cervical back: Normal range of motion and neck supple.  Lymphadenopathy:     Cervical: No cervical adenopathy.  Skin:    General: Skin is warm and dry.  Neurological:     Mental Status: She is alert and oriented to person, place, and time.  Psychiatric:        Behavior: Behavior normal.     ED Results / Procedures / Treatments   Labs (all labs ordered are listed, but only abnormal results are displayed) Labs Reviewed  COMPREHENSIVE METABOLIC PANEL WITH GFR - Abnormal; Notable for the following components:      Result Value   Glucose, Bld 153 (*)    Creatinine, Ser 1.09 (*)    GFR, Estimated 50 (*)    All other components within normal limits  CBC WITH DIFFERENTIAL/PLATELET - Abnormal; Notable for the following components:   RBC 3.17 (*)    Hemoglobin 10.2 (*)    HCT 30.5 (*)    All other components within normal limits  GROUP A STREP BY PCR  LACTIC ACID, PLASMA  LACTIC ACID, PLASMA    EKG None  Radiology DG Chest 2 View Result Date: 09/22/2023 CLINICAL DATA:  Shortness of breath. EXAM: CHEST - 2 VIEW COMPARISON:  August 17, 2017. FINDINGS: The heart size and mediastinal contours are within normal limits. Both lungs are clear. The visualized skeletal structures are unremarkable. IMPRESSION: No active cardiopulmonary disease. Electronically Signed   By: Rosalene Colon M.D.   On: 09/22/2023 13:39    Procedures Procedures    Medications Ordered in ED Medications - No data to display  ED Course/ Medical Decision Making/ A&P Clinical Course as of 09/22/23 1730  Wed Sep 22, 2023  1528 Glucose(!): 153 [AH]    Clinical Course User Index [AH] Tama Fails, PA-C                                  Medical Decision Making Amount and/or Complexity of Data Reviewed Labs: ordered. Decision-making details documented in ED Course. Radiology: ordered.  Risk Prescription drug management.   This is a 83 year old female who had a fever at home.  She has a history of COPD.  She has some changes to her breath sounds at the base of the lungs.  Her flu and COVID point-of-care test were negative today at the urgent care.  She has  sore throat and URI symptoms.  Given these findings we will treat clinically for community-acquired pneumonia.  Patient given IV Rocephin  and azithromycin  here.  Been afebrile here. Reviewed the patient's labs she has no elevated white blood cell count, hemoglobin stable at 10.2.  CMP shows mildly elevated blood glucose of insignificant value, negative strep test. I visualized interpreted two-view chest x-ray which shows no acute findings. Sinus rhythm at a rate of 81 EKG        Final Clinical Impression(s) / ED Diagnoses Final diagnoses:  None    Rx / DC Orders ED Discharge Orders     None         Tama Fails, PA-C 09/22/23 1739    Auston Blush, MD 09/28/23 1311

## 2023-09-22 NOTE — ED Provider Notes (Signed)
 [facility]    CSN: [id number] Arrival date & time: 09/22/23  8295      History   Chief Complaint Chief Complaint  Patient presents with   Sore Throat   Nasal Congestion   COPD/Asthma Exacerbation    HPI Janet Giles is a 83 y.o. female.   Janet Giles is a 83 y.o. female presenting for chief complaint of sore throat, nasal congestion, generalized fatigue, and intermittent shortness of breath that started 2 days ago.  Cough is mostly dry and nonproductive.  Shortness of breath is worsened by coughing.  Denies chest pain, heart palpitations, leg swelling, orthopnea, N/V/D, abdominal pain, dizziness, and rash.  She has had low-grade fevers at home between 99-100.0.  Afebrile today.  History of asthma/COPD, uses Bevespi  inhaler every 12 hours for maintenance and intermittently uses albuterol  as needed for rescue shortness of breath.  She has not needed to use her albuterol  inhaler recently.  History of lupus, denies other history of immunosuppression. COVID-19 testing at home 2 days ago was negative.  Blood pressure is lower than baseline in triage.  She does not have a history of hypertension and does not take any antihypertensive medications.  States she drinks plenty of water and denies dizziness.    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   Allergic rhinitis 10/22/2020   Anemia 10/22/2020   Asthma 10/22/2020   Bronchiectasis (HCC) 10/22/2020   Chronic obstructive pulmonary disease, unspecified (HCC) 10/22/2020   History of bee sting allergy 10/22/2020   Hypercholesterolemia 10/22/2020   Hypothyroidism 10/22/2020   Loss of appetite 10/22/2020   Osteopenia 10/22/2020   Neuralgia 10/17/2020   Levator spasm 10/04/2020   History of total hip replacement, right 08/13/2016   Idiopathic peripheral neuropathy 08/13/2016   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   Former smoker 08/13/2016   ANA positive 08/06/2016   Raynaud's disease without gangrene 08/06/2016   Primary osteoarthritis of both hands 08/06/2016   Primary osteoarthritis of both  feet 08/06/2016   High risk medication use 08/06/2016   Primary osteoarthritis of right hip 03/24/2015   Aortic ectasia, 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    Past Surgical History:  Procedure Laterality Date   ABDOMINAL HYSTERECTOMY     BACK SURGERY  02   L4-L5- fusion   BRONCHIAL BRUSHINGS  08/07/2019   Procedure: BRONCHIAL BRUSHINGS;  Surgeon: Joesph Mussel, DO;  Location: [facility];  Service: Endoscopy;;   BRONCHIAL WASHINGS   08/07/2019   Procedure: BRONCHIAL WASHINGS;  Surgeon: Joesph Mussel, DO;  Location: [facility];  Service: Endoscopy;;   COLONOSCOPY WITH PROPOFOL  N/A 03/17/2016   Procedure: COLONOSCOPY WITH PROPOFOL ;  Surgeon: Garrett Kallman, MD;  Location: [facility];  Service: Endoscopy;  Laterality: N/A;   ESOPHAGOGASTRODUODENOSCOPY (EGD) WITH PROPOFOL  N/A 03/17/2016   Procedure: ESOPHAGOGASTRODUODENOSCOPY (EGD) WITH PROPOFOL ;  Surgeon: Garrett Kallman, MD;  Location: [facility];  Service: Endoscopy;  Laterality: N/A;   JOINT REPLACEMENT     TONSILLECTOMY AND ADENOIDECTOMY     TOTAL HIP ARTHROPLASTY Right 03/25/2015   Procedure: TOTAL HIP ARTHROPLASTY ANTERIOR APPROACH;  Surgeon: Janet Hamburger, MD;  Location: [facility];  Service: Orthopedics;  Laterality: Right;   VIDEO BRONCHOSCOPY N/A 08/07/2019   Procedure: VIDEO BRONCHOSCOPY WITHOUT FLUORO;  Surgeon: Joesph Mussel, DO;  Location: [facility];  Service: Endoscopy;  Laterality: N/A;    OB History   No obstetric history on file.      Home Medications    Prior to Admission medications   Medication Sig Start Date End Date Taking? Authorizing Provider  Glycopyrrolate-Formoterol  (BEVESPI  AEROSPHERE) 9-4.8 MCG/ACT AERO Inhale 2 puffs into the lungs 2 (two) times daily. 07/15/23 10/13/23 Yes Wilfredo Hanly, MD  nitrofurantoin, macrocrystal-monohydrate, (MACROBID) 100 MG capsule Take 100 mg by mouth 2 (two) times daily. 06/18/23  Yes [provider]  albuterol  (VENTOLIN  HFA) 108 (90 Base) MCG/ACT inhaler Inhale 1 puff into the lungs every 6 (six) hours as needed for wheezing or shortness of breath. 10/09/21   Wilfredo Hanly, MD  ALPHA LIPOIC ACID PO Take by mouth.    [provider]  Calcium Carb-Cholecalciferol (CALCIUM + D3 PO) Take 1 tablet by mouth daily. Patient not taking: Reported on 04/14/2023    [provider]  Ferrous Sulfate (IRON) 325 (65 Fe) MG TABS Take 1 tablet by mouth.    [provider]  gabapentin   (NEURONTIN ) 400 MG capsule Take 400 mg by mouth 2 (two) times daily. 03/18/22   [provider]  HYDROcodone -acetaminophen  (NORCO/VICODIN) 5-325 MG tablet Take 1 tablet by mouth 2 (two) times daily as needed. 09/26/21   [provider]  levothyroxine  (SYNTHROID , LEVOTHROID) 75 MCG tablet Take 75 mcg by mouth at bedtime. 05/18/13   [provider]  Multiple Vitamin (ONE-A-DAY ESSENTIAL PO) Take by mouth.    [provider]  vitamin B-12 (CYANOCOBALAMIN) 500 MCG tablet Take 500 mcg by mouth every evening.    [provider]    Family History Family History  Problem Relation Age of Onset   Diabetes Mother    Heart disease Mother    Heart attack Mother    Bleeding Disorder Father    Peptic Ulcer Father    Heart disease Sister    Heart attack Sister    Cancer Sister    Deep vein thrombosis Sister    Diabetes Sister    Hyperlipidemia Sister    Hypertension Sister    Varicose Veins Sister    AAA (abdominal aortic aneurysm)  Sister    Bleeding Disorder Sister    Breast cancer Sister    Heart disease Brother    Heart attack Brother    Hypertension Brother     Social History Social History   Tobacco Use   Smoking status: Former    Current packs/day: 0.00    Average packs/day: 1.5 packs/day for 30.0 years (45.0 ttl pk-yrs)    Types: Cigarettes    Start date: 05/11/1970    Quit date: 05/11/2000    Years since quitting: 23.3    Passive exposure: Never   Smokeless tobacco: Never  Vaping Use   Vaping status: Never Used  Substance Use Topics   Alcohol use: Never    Alcohol/week: 0.0 standard drinks of alcohol   Drug use: Never     Allergies   Bee venom, Wasp venom, Doxycycline, Lovastatin, and Naproxen   Review of Systems Review of Systems Per HPI  Physical Exam Triage Vital Signs ED Triage Vitals  Encounter Vitals Group     BP 09/22/23 1050 (!) 91/59     Systolic BP Percentile --      Diastolic BP Percentile --      Pulse Rate  09/22/23 1050 80     Resp 09/22/23 1050 20     Temp 09/22/23 1050 98 F (36.7 C)     Temp Source 09/22/23 1050 Oral     SpO2 09/22/23 1050 95 %     Weight 09/22/23 1048 130 lb (59 kg)     Height 09/22/23 1048 5\' 4"  (1.626 m)     Head Circumference --      Peak Flow --      Pain Score 09/22/23 1046 8     Pain Loc --      Pain Education --      Exclude from Growth Chart --    No data found.  Updated Vital Signs BP (!) 94/59 (BP Location: Right Arm)   Pulse 76   Temp 98 F (36.7 C) (Oral)   Resp 20   Ht 5\' 4"  (1.626 m)   Wt 130 lb (59 kg)   SpO2 95%   BMI 22.31 kg/m   Visual Acuity Right Eye Distance:   Left Eye Distance:   Bilateral Distance:    Right Eye Near:   Left Eye Near:    Bilateral Near:     Physical Exam Vitals and nursing note reviewed.  Constitutional:      Appearance: She is not ill-appearing or toxic-appearing.  HENT:     Head: Normocephalic and atraumatic.     Right Ear: Hearing, tympanic membrane, ear canal and external ear normal.     Left Ear: Hearing, tympanic membrane, ear canal and external ear normal.     Nose: Congestion present.     Mouth/Throat:     Lips: Pink.     Mouth: Mucous membranes are moist. No injury or oral lesions.     Dentition: Normal dentition.     Tongue: No lesions.     Pharynx: Oropharynx is clear. Uvula midline. No pharyngeal swelling, oropharyngeal exudate, posterior oropharyngeal erythema, uvula swelling or postnasal drip.     Tonsils: No tonsillar exudate.  Eyes:     General: Lids are normal. Vision grossly intact. Gaze aligned appropriately.     Extraocular Movements: Extraocular movements intact.     Conjunctiva/sclera: Conjunctivae normal.  Neck:     Trachea: Trachea and phonation normal.  Cardiovascular:     Rate and Rhythm: Normal  rate and regular rhythm.     Heart sounds: Normal heart sounds, S1 normal and S2 normal.  Pulmonary:     Effort: Pulmonary effort is normal. No respiratory distress.     Breath  sounds: Normal air entry. Rhonchi and rales (Rales and faint rhonchi to the left lower lung field) present. No wheezing.     Comments: Speaks in full sentences without difficulty. Chest:     Chest wall: No tenderness.  Musculoskeletal:     Cervical back: Neck supple.  Lymphadenopathy:     Cervical: No cervical adenopathy.  Skin:    General: Skin is warm and dry.     Capillary Refill: Capillary refill takes less than 2 seconds.     Findings: No rash.  Neurological:     General: No focal deficit present.     Mental Status: She is alert and oriented to person, place, and time. Mental status is at baseline.     Cranial Nerves: No dysarthria or facial asymmetry.  Psychiatric:        Mood and Affect: Mood normal.        Speech: Speech normal.        Behavior: Behavior normal.        Thought Content: Thought content normal.        Judgment: Judgment normal.      UC Treatments / Results  Labs (all labs ordered are listed, but only abnormal results are displayed) Labs Reviewed  POCT INFLUENZA A/B - Normal  POC SARS CORONAVIRUS 2 AG -  ED - Normal    EKG   Radiology No results found.  Procedures Procedures (including critical care time)  Medications Ordered in UC Medications - No data to display  Initial Impression / Assessment and Plan / UC Course  I have reviewed the triage vital signs and the nursing notes.  Pertinent labs & imaging results that were available during my care of the patient were reviewed by me and considered in my medical decision making (see chart for details).   1.  Low blood pressure reading, acute cough, shortness of breath High clinical suspicion for acute pneumonia of the left lower lobe given lung exam findings.  Blood pressure is lower than baseline today at 94/59.  Vital signs are otherwise stable. BP Readings from Last 3 Encounters:  09/22/23 (!) 94/59  06/04/23 127/69  04/14/23 103/69   Point-of-care influenza and COVID testing is  negative.  Recommend further workup and evaluation in the emergency room to rule out sepsis given hypotension, concern for infectious pneumonia, and history of immunosuppression with lupus.  Discussed clinical concerns/exam findings leading to recommendation for further workup in the ER setting and risks of deferring ER visit with patient/family. Patient/family express understanding and agreement with plan, discharged to ER via private car with husband.  Final Clinical Impressions(s) / UC Diagnoses   Final diagnoses:  Low blood pressure reading  Acute cough  Shortness of breath     Discharge Instructions      Please go to the nearest ER for further evaluation. I'm concerned you may have pneumonia with a serious infection causing your blood pressure to be lower than normal.    ED Prescriptions   None    PDMP not reviewed this encounter.   Starlene Eaton, [place] 09/22/23 1206

## 2023-09-22 NOTE — ED Notes (Signed)
 Patient is being discharged from the Urgent Care and sent to the Emergency Department via Private Vehicle (Self) . Per Provider, patient is in need of higher level of care due to Low BP. Patient is aware and verbalizes understanding of plan of care.  Vitals:   09/22/23 1050 09/22/23 1126  BP: (!) 91/59 (!) 94/59  Pulse: 80 76  Resp: 20   Temp: 98 F (36.7 C)   SpO2: 95%

## 2023-09-22 NOTE — ED Triage Notes (Signed)
 Seen at [facility] today for ongoing cough, congestion, sore throat. Sent for concerning chest congestion. Using inhaler at home with some relief. Hypotensive at [facility]- appears to be normotensive here- denies CP, dizziness.

## 2023-10-06 DIAGNOSIS — G4700 Insomnia, unspecified: Secondary | ICD-10-CM | POA: Diagnosis not present

## 2023-10-06 DIAGNOSIS — M7061 Trochanteric bursitis, right hip: Secondary | ICD-10-CM | POA: Diagnosis not present

## 2023-10-06 DIAGNOSIS — M47816 Spondylosis without myelopathy or radiculopathy, lumbar region: Secondary | ICD-10-CM | POA: Diagnosis not present

## 2023-10-06 DIAGNOSIS — G894 Chronic pain syndrome: Secondary | ICD-10-CM | POA: Diagnosis not present

## 2023-10-14 ENCOUNTER — Telehealth: Payer: Self-pay

## 2023-10-14 NOTE — Telephone Encounter (Signed)
 Copied from CRM [id number]. Topic: General - Other >> Oct 14, 2023 12:49 PM Trula Gable S wrote: Can I add patient in blocked slot or double book? Please advise.

## 2023-10-15 NOTE — Telephone Encounter (Signed)
 Spoke with patient regarding prior message . Patient has been scheduled on 11/16/2023 with Dr.Dewald Patient's voice was understanding. Nothing else further needed.

## 2023-11-02 ENCOUNTER — Other Ambulatory Visit: Payer: Self-pay | Admitting: Internal Medicine

## 2023-11-02 DIAGNOSIS — Z1231 Encounter for screening mammogram for malignant neoplasm of breast: Secondary | ICD-10-CM

## 2023-11-04 DIAGNOSIS — M47816 Spondylosis without myelopathy or radiculopathy, lumbar region: Secondary | ICD-10-CM | POA: Diagnosis not present

## 2023-11-10 DIAGNOSIS — I77811 Abdominal aortic ectasia: Secondary | ICD-10-CM | POA: Diagnosis not present

## 2023-11-10 DIAGNOSIS — M8580 Other specified disorders of bone density and structure, unspecified site: Secondary | ICD-10-CM | POA: Diagnosis not present

## 2023-11-10 DIAGNOSIS — Z1159 Encounter for screening for other viral diseases: Secondary | ICD-10-CM | POA: Diagnosis not present

## 2023-11-10 DIAGNOSIS — Z0000 Encounter for general adult medical examination without abnormal findings: Secondary | ICD-10-CM | POA: Diagnosis not present

## 2023-11-10 DIAGNOSIS — D649 Anemia, unspecified: Secondary | ICD-10-CM | POA: Diagnosis not present

## 2023-11-10 DIAGNOSIS — I700 Atherosclerosis of aorta: Secondary | ICD-10-CM | POA: Diagnosis not present

## 2023-11-10 DIAGNOSIS — E039 Hypothyroidism, unspecified: Secondary | ICD-10-CM | POA: Diagnosis not present

## 2023-11-10 DIAGNOSIS — I350 Nonrheumatic aortic (valve) stenosis: Secondary | ICD-10-CM | POA: Diagnosis not present

## 2023-11-10 DIAGNOSIS — J449 Chronic obstructive pulmonary disease, unspecified: Secondary | ICD-10-CM | POA: Diagnosis not present

## 2023-11-10 DIAGNOSIS — E7800 Pure hypercholesterolemia, unspecified: Secondary | ICD-10-CM | POA: Diagnosis not present

## 2023-11-10 DIAGNOSIS — G629 Polyneuropathy, unspecified: Secondary | ICD-10-CM | POA: Diagnosis not present

## 2023-11-10 DIAGNOSIS — I7300 Raynaud's syndrome without gangrene: Secondary | ICD-10-CM | POA: Diagnosis not present

## 2023-11-10 DIAGNOSIS — M545 Low back pain, unspecified: Secondary | ICD-10-CM | POA: Diagnosis not present

## 2023-11-10 DIAGNOSIS — M85852 Other specified disorders of bone density and structure, left thigh: Secondary | ICD-10-CM | POA: Diagnosis not present

## 2023-11-16 ENCOUNTER — Ambulatory Visit (INDEPENDENT_AMBULATORY_CARE_PROVIDER_SITE_OTHER): Admitting: Pulmonary Disease

## 2023-11-16 ENCOUNTER — Encounter: Payer: Self-pay | Admitting: Pulmonary Disease

## 2023-11-16 VITALS — BP 100/63 | HR 75 | Ht 64.0 in | Wt 132.4 lb

## 2023-11-16 DIAGNOSIS — Z87891 Personal history of nicotine dependence: Secondary | ICD-10-CM | POA: Diagnosis not present

## 2023-11-16 DIAGNOSIS — J449 Chronic obstructive pulmonary disease, unspecified: Secondary | ICD-10-CM | POA: Diagnosis not present

## 2023-11-16 MED ORDER — ALBUTEROL SULFATE HFA 108 (90 BASE) MCG/ACT IN AERS: 1.0000 | INHALATION_SPRAY | Freq: Four times a day (QID) | RESPIRATORY_TRACT | 6 refills | Status: AC | PRN

## 2023-11-16 MED ORDER — BEVESPI AEROSPHERE 9-4.8 MCG/ACT IN AERO
2.0000 | INHALATION_SPRAY | Freq: Two times a day (BID) | RESPIRATORY_TRACT | 11 refills | Status: AC
Start: 2023-11-16 — End: ?

## 2023-11-16 NOTE — Progress Notes (Signed)
 Synopsis: Referred in March 2021 for shortness of breath by Merilee, L.Addie, MD. Former patient of Dr. Gretta.  Subjective:   PATIENT ID: Janet Giles GENDER: female DOB: 1940/06/18, MRN: [id number]  HPI  Chief Complaint  Patient presents with   Follow-up   Janet Giles is an 83 year old woman, former smoker with COPD (FEV1 1.14L, 57%) and pulmonary nodule who returns to pulmonary clinic for follow up.   She experiences occasional dyspnea, which requires the use of her emergency inhaler. She stops activities when exertional dyspnea occurs. Her past pneumonia in May was treated with antibiotics, and she has improved since then.  She uses Bevespi  inhaler, two puffs in the morning and two puffs in the evening, which has significantly improved her quality of life.  OV 10/08/22 She is doing well on bevespi  2 puffs twice daily and as needed albuterol . She has increased dyspnea when working in her yard and she does get relief from albuterol . No issues with cough or mucous production.   OV 10/22/21 She continues to do well with Bevespi  2 puffs twice daily. She rarely needs albuterol . She reports increased symptoms of GERD.   CT Chest 09/24/21 show stable tree in bud nodularity of the RUL, no other new suspicious nodules and chronic areas of mild peripheral interstitial thickening.   Initial OV 10/22/20 She was previously followed by Dr. Gretta. She is on Bevespi  twice daily for her COPD and rarely uses albuterol . Her breathing remains overall well since starting the bevespi .   She had a follow-up CT chest scan on 09/23/20 which showed stable tree-in-bud nodularity of the RUL from previous scan 05/28/19. No new suspicious nodules and resolution of the LLL groundglass opacity.   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 Dolphus cushing   Neuromuscular disorder (HCC)    neuropathy in feet   PONV (postoperative nausea and vomiting)    Primary osteoarthritis of both feet 08/06/2016   Primary osteoarthritis of both hands 08/06/2016   Primary osteoarthritis of right hip 03/24/2015   Raynaud's disease without gangrene 08/06/2016   Shortness of breath dyspnea    uses inhaler occ   Systemic lupus erythematosus (HCC)      Family History  Problem Relation Age of Onset   Diabetes Mother    Heart disease Mother    Heart attack Mother    Bleeding Disorder Father    Peptic Ulcer Father    Heart disease Sister    Heart attack Sister    Cancer Sister    Deep vein thrombosis Sister    Diabetes Sister    Hyperlipidemia Sister    Hypertension Sister    Varicose Veins  Sister    AAA (abdominal aortic aneurysm) Sister    Bleeding Disorder Sister    Breast cancer Sister    Heart disease Brother    Heart attack Brother    Hypertension Brother      Social History   Socioeconomic History   Marital status: Married    Spouse name: Not on file   Number of children: 3   Years of education: Not on file   Highest education level: Some college, no degree  Occupational History   Occupation: Retired  Tobacco Use   Smoking status: Former    Current packs/day: 0.00    Average packs/day: 1.5 packs/day for 30.0 years (45.0 ttl pk-yrs)    Types:  Cigarettes    Start date: 05/11/1970    Quit date: 05/11/2000    Years since quitting: 23.5    Passive exposure: Never   Smokeless tobacco: Never  Vaping Use   Vaping status: Never Used  Substance and Sexual Activity   Alcohol use: Never    Alcohol/week: 0.0 standard drinks of alcohol   Drug use: Never   Sexual activity: Not Currently  Other Topics Concern   Not on file  Social History Narrative   Right handed   2 cups of caffeine daily   Lives at home with husband   Social Drivers of Corporate investment banker Strain: Not on file  Food Insecurity: Low Risk  (12/17/2022)   Received from [facility]   Hunger Vital Sign    Within the past 12 months, you worried that your food would run out before you got money to buy more: Never true    Within the past 12 months, the food you bought just didn't last and you didn't have money to get more. : Never true  Transportation Needs: Not on file (12/17/2022)  Physical Activity: Not on file  Stress: Not on file  Social Connections: Not on file  Intimate Partner Violence: Not on file     Allergies  Allergen Reactions   Bee Venom Anaphylaxis   Wasp Venom Shortness Of Breath and Swelling   Doxycycline Nausea And Vomiting   Lovastatin     Other reaction(s): muscle aches   Naproxen     Other reaction(s): nausea     Outpatient Medications Prior to Visit  Medication Sig Dispense Refill   ALPHA LIPOIC ACID PO Take by mouth.     Calcium Carb-Cholecalciferol (CALCIUM + D3 PO) Take 1 tablet by mouth daily.     Ferrous Sulfate (IRON) 325 (65 Fe) MG TABS Take 1 tablet by mouth.     gabapentin  (NEURONTIN ) 400 MG capsule Take 400 mg by mouth 2 (two) times daily.     HYDROcodone -acetaminophen  (NORCO/VICODIN) 5-325 MG tablet Take 1 tablet by mouth 2 (two) times daily as needed.     levothyroxine  (SYNTHROID , LEVOTHROID) 75 MCG tablet Take 75 mcg by mouth at bedtime.     Multiple Vitamin (ONE-A-DAY ESSENTIAL PO) Take by mouth.     nitrofurantoin,  macrocrystal-monohydrate, (MACROBID) 100 MG capsule Take 100 mg by mouth 2 (two) times daily.     vitamin B-12 (CYANOCOBALAMIN) 500 MCG tablet Take 500 mcg by mouth every evening.     albuterol  (VENTOLIN  HFA) 108 (90 Base) MCG/ACT inhaler Inhale 1 puff into the lungs every 6 (six) hours as needed for wheezing or shortness of breath. 6.7 g 6   azithromycin  (ZITHROMAX  Z-PAK) 250 MG tablet 2 po day one, then 1 daily x 4 days (Patient  not taking: Reported on 11/16/2023) 6 tablet 0   No facility-administered medications prior to visit.    Review of Systems  Constitutional:  Negative for chills, fever, malaise/fatigue and weight loss.  HENT:  Negative for congestion, sinus pain and sore throat.   Eyes: Negative.   Respiratory:  Positive for shortness of breath (exertional). Negative for cough, hemoptysis, sputum production and wheezing.   Cardiovascular:  Negative for chest pain, palpitations, orthopnea, claudication and leg swelling.  Gastrointestinal:  Negative for abdominal pain, heartburn, nausea and vomiting.  Genitourinary: Negative.   Musculoskeletal:  Negative for joint pain and myalgias.  Skin:  Negative for rash.  Neurological:  Negative for weakness.  Endo/Heme/Allergies: Negative.   Psychiatric/Behavioral: Negative.      Objective:   Vitals:   11/16/23 1321 11/16/23 1323  BP: 100/63 100/63  Pulse: 75 75  Weight:  132 lb 6.4 oz (60.1 kg)  Height:  5' 4 (1.626 m)    Physical Exam Constitutional:      General: She is not in acute distress.    Appearance: She is not ill-appearing.  HENT:     Head: Normocephalic and atraumatic.  Eyes:     General: No scleral icterus.    Conjunctiva/sclera: Conjunctivae normal.  Cardiovascular:     Rate and Rhythm: Normal rate and regular rhythm.     Pulses: Normal pulses.     Heart sounds: Normal heart sounds. No murmur heard. Pulmonary:     Effort: Pulmonary effort is normal.     Breath sounds: Normal breath sounds. No wheezing,  rhonchi or rales.  Musculoskeletal:     Right lower leg: No edema.     Left lower leg: No edema.  Skin:    General: Skin is warm and dry.  Neurological:     General: No focal deficit present.     Mental Status: She is alert.    CBC    Component Value Date/Time   WBC 4.9 09/22/2023 1318   RBC 3.17 (L) 09/22/2023 1318   HGB 10.2 (L) 09/22/2023 1318   HCT 30.5 (L) 09/22/2023 1318   PLT 323 09/22/2023 1318   MCV 96.2 09/22/2023 1318   MCH 32.2 09/22/2023 1318   MCHC 33.4 09/22/2023 1318   RDW 12.6 09/22/2023 1318   LYMPHSABS 1.5 09/22/2023 1318   MONOABS 0.5 09/22/2023 1318   EOSABS 0.1 09/22/2023 1318   BASOSABS 0.0 09/22/2023 1318      Latest Ref Rng & Units 09/22/2023    1:18 PM 04/14/2022   11:12 AM 04/10/2021   11:42 AM  BMP  Glucose 70 - 99 mg/dL 846  76  859   BUN 8 - 23 mg/dL 19  28  22    Creatinine 0.44 - 1.00 mg/dL 8.90  9.07  9.12   BUN/Creat Ratio 6 - 22 (calc)  30  NOT APPLICABLE   Sodium 135 - 145 mmol/L 137  140  141   Potassium 3.5 - 5.1 mmol/L 4.3  4.8  4.4   Chloride 98 - 111 mmol/L 102  103  105   CO2 22 - 32 mmol/L 24  31  28    Calcium 8.9 - 10.3 mg/dL 9.8  89.4  9.5     Chest imaging: CT Chest 09/24/21 1. No acute findings. 2. Tree-in-bud type areas of nodularity, most evident in the right upper lobe, are stable, benign, consistent with a chronic inflammatory etiology. 3. No suspicious lung nodules. 4. Chronic areas of mild peripheral interstitial thickening. 5. Aortic  atherosclerosis and coronary artery calcifications.  CT Chest 09/23/2020 Stable tree-in-bud branching nodularity in the RIGHT upper lobe consistent with chronic infectious or inflammatory process. Resolution of ground-glass opacities in the LEFT lower lobe.  CT chest 07/05/2019 mild bronchiectasis in all lobes, right upper lobe peripheral tree-in-bud opacities, associated micronodules.  No significant nodularity in RML, minimal distal inferior lingular nodules. Linear scarring  bilateral lower lobes.  Borderline mediastinal adenopathy.   CT chest 09/25/2019 Apical irregular nodules and tree-in-bud opacities.  Emphysema and airway thickening.  Pleural thickening of major fissure on the left.  All nodules shrinking other than nodule in azygo-esophageal recess, which is stable.  Senescent vascular calcification.  PFT:    Latest Ref Rng & Units 09/04/2019   11:52 AM 01/09/2014    3:34 PM  PFT Results  FVC-Pre L 1.72  2.01   FVC-Predicted Pre % 64  69   FVC-Post L 2.06  2.33   FVC-Predicted Post % 77  81   Pre FEV1/FVC % % 60  63   Post FEV1/FCV % % 55  60   FEV1-Pre L 1.03  1.26   FEV1-Predicted Pre % 52  58   FEV1-Post L 1.14  1.39   DLCO uncorrected ml/min/mmHg 12.20  11.79   DLCO UNC% % 64  48   DLCO corrected ml/min/mmHg 13.65    DLCO COR %Predicted % 72    DLVA Predicted % 93  69   TLC L 4.90  4.45   TLC % Predicted % 96  88   RV % Predicted % 134  105   PFT 2021: moderately severe obstructive defect with mild diffusion defect   Labs: Micro: 08/07/2019 BAL fungus-penicillium species 08/07/2019 BAL AFB-negative 08/07/2019 BAL respiratory-negative 08/07/2019 BAL fungus lingula-negative 08/07/2019 BAL AFB lingula-negative 08/07/2019 BAL respiratory-negative  Echocardiogram 10/06/2018: LVEF 55 to 60%, grade 1 diastolic dysfunction with elevated LVEDP.  Normal LA, RV, RA.  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  Mild MR.    Assessment & Plan:   Chronic obstructive pulmonary disease, unspecified COPD type (HCC) - Plan: albuterol  (VENTOLIN  HFA) 108 (90 Base) MCG/ACT inhaler, Glycopyrrolate-Formoterol  (BEVESPI  AEROSPHERE) 9-4.8 MCG/ACT AERO  Discussion: Janet Giles is an 83 year old woman, former smoker with COPD (FEV1 1.14L, 57%) and pulmonary nodule who returns to pulmonary clinic for follow up.   She is to continue bevespi  twice daily and as needed albuterol  for her COPD which is stable at this time.    Follow up in 1 year.   Dorn Chill, MD [facility] Office: [phone]     Current Outpatient Medications:    ALPHA LIPOIC ACID PO, Take by mouth., Disp: , Rfl:    Calcium Carb-Cholecalciferol (CALCIUM + D3 PO), Take 1 tablet by mouth daily., Disp: , Rfl:    Ferrous Sulfate (IRON) 325 (65 Fe) MG TABS, Take 1 tablet by mouth., Disp: , Rfl:    gabapentin  (NEURONTIN ) 400 MG capsule, Take 400 mg by mouth 2 (two) times daily., Disp: , Rfl:    Glycopyrrolate-Formoterol  (BEVESPI  AEROSPHERE) 9-4.8 MCG/ACT AERO, Inhale 2 puffs into the lungs 2 (two) times daily., Disp: 10.7 g, Rfl: 11   HYDROcodone -acetaminophen  (NORCO/VICODIN) 5-325 MG tablet, Take 1 tablet by mouth 2 (two) times daily as needed., Disp: , Rfl:    levothyroxine  (SYNTHROID , LEVOTHROID) 75 MCG tablet, Take 75 mcg by mouth at bedtime., Disp: , Rfl:    Multiple Vitamin (ONE-A-DAY ESSENTIAL PO), Take by mouth., Disp: , Rfl:  nitrofurantoin, macrocrystal-monohydrate, (MACROBID) 100 MG capsule, Take 100 mg by mouth 2 (two) times daily., Disp: , Rfl:    vitamin B-12 (CYANOCOBALAMIN) 500 MCG tablet, Take 500 mcg by mouth every evening., Disp: , Rfl:    albuterol  (VENTOLIN  HFA) 108 (90 Base) MCG/ACT inhaler, Inhale 1 puff into the lungs every 6 (six) hours as needed for wheezing or shortness of breath., Disp: 6.7 g, Rfl: 6

## 2023-11-16 NOTE — Patient Instructions (Addendum)
 Continue bevespi  inhale 2 puffs twice daily  Use albuterol  inhaler 1-2 puffs every 4-6 hours as needed.   Follow up in 1 year, call sooner if needed

## 2023-12-01 DIAGNOSIS — M7061 Trochanteric bursitis, right hip: Secondary | ICD-10-CM | POA: Diagnosis not present

## 2023-12-01 DIAGNOSIS — G894 Chronic pain syndrome: Secondary | ICD-10-CM | POA: Diagnosis not present

## 2023-12-01 DIAGNOSIS — M47816 Spondylosis without myelopathy or radiculopathy, lumbar region: Secondary | ICD-10-CM | POA: Diagnosis not present

## 2023-12-01 DIAGNOSIS — G4700 Insomnia, unspecified: Secondary | ICD-10-CM | POA: Diagnosis not present

## 2023-12-02 DIAGNOSIS — C44629 Squamous cell carcinoma of skin of left upper limb, including shoulder: Secondary | ICD-10-CM | POA: Diagnosis not present

## 2023-12-10 ENCOUNTER — Ambulatory Visit
Admission: RE | Admit: 2023-12-10 | Discharge: 2023-12-10 | Disposition: A | Discharge status: Home / Self Care | Source: Ambulatory Visit | Attending: Internal Medicine | Admitting: Internal Medicine

## 2023-12-10 DIAGNOSIS — Z1231 Encounter for screening mammogram for malignant neoplasm of breast: Secondary | ICD-10-CM | POA: Diagnosis not present

## 2023-12-23 DIAGNOSIS — N898 Other specified noninflammatory disorders of vagina: Secondary | ICD-10-CM | POA: Diagnosis not present

## 2023-12-23 DIAGNOSIS — R300 Dysuria: Secondary | ICD-10-CM | POA: Diagnosis not present

## 2023-12-23 DIAGNOSIS — N952 Postmenopausal atrophic vaginitis: Secondary | ICD-10-CM | POA: Diagnosis not present

## 2023-12-23 DIAGNOSIS — R102 Pelvic and perineal pain: Secondary | ICD-10-CM | POA: Diagnosis not present

## 2023-12-30 DIAGNOSIS — R102 Pelvic and perineal pain: Secondary | ICD-10-CM | POA: Diagnosis not present

## 2024-01-20 DIAGNOSIS — M7061 Trochanteric bursitis, right hip: Secondary | ICD-10-CM | POA: Diagnosis not present

## 2024-01-20 DIAGNOSIS — M545 Low back pain, unspecified: Secondary | ICD-10-CM | POA: Diagnosis not present

## 2024-01-26 DIAGNOSIS — G894 Chronic pain syndrome: Secondary | ICD-10-CM | POA: Diagnosis not present

## 2024-01-26 DIAGNOSIS — G4700 Insomnia, unspecified: Secondary | ICD-10-CM | POA: Diagnosis not present

## 2024-01-26 DIAGNOSIS — M47816 Spondylosis without myelopathy or radiculopathy, lumbar region: Secondary | ICD-10-CM | POA: Diagnosis not present

## 2024-01-26 DIAGNOSIS — M7061 Trochanteric bursitis, right hip: Secondary | ICD-10-CM | POA: Diagnosis not present

## 2024-02-07 ENCOUNTER — Telehealth: Payer: Self-pay | Admitting: Cardiovascular Disease

## 2024-02-07 DIAGNOSIS — I350 Nonrheumatic aortic (valve) stenosis: Secondary | ICD-10-CM

## 2024-02-07 NOTE — Telephone Encounter (Signed)
 Spoke with pt who is reporting having fatigue for over a month.  Recently saw her PCP who didn't find anything. (See lab below) per pt's report.  Pt is concerned because she has not had a recent echo to monitor her mild/mod AS as seen 06/03/22.  Advised  I will have Dr Delford review for echo order.  CBC without Diff Reviewed date:11/11/2023 08:12:21 AM Interpretation:Hgb: 10.3 Performing Lab: Notes/Report: Testing Performed at: [facility], [address], [place], [place] [address]  WBC 5.4 4.0-11.0 K/ul    RBC 3.17 4.20-5.40 M/uL    HGB 10.3 12.0-16.0 g/dL    HCT 70.4 62.9-52.9 %    MCV 92.9 81.0-99.0 fL    MCH 32.5 27.0-33.0 pg    MCHC 35.0 32.0-36.0 g/dL    RDW 86.4 88.4-84.4 %    PLT 348 150-400 K/uL     She was appreciative of the call back and information/assistance.

## 2024-02-07 NOTE — Telephone Encounter (Signed)
 Patient says for the past month she has been fatigued + has indigestion. She would like a call back to discuss.

## 2024-02-08 ENCOUNTER — Ambulatory Visit (HOSPITAL_COMMUNITY)
Admission: RE | Admit: 2024-02-08 | Discharge: 2024-02-08 | Disposition: A | Discharge status: Home / Self Care | Source: Ambulatory Visit | Attending: Cardiovascular Disease | Admitting: Cardiovascular Disease

## 2024-02-08 ENCOUNTER — Ambulatory Visit: Payer: Self-pay | Admitting: Cardiovascular Disease

## 2024-02-08 ENCOUNTER — Telehealth: Payer: Self-pay | Admitting: Cardiovascular Disease

## 2024-02-08 DIAGNOSIS — I350 Nonrheumatic aortic (valve) stenosis: Secondary | ICD-10-CM | POA: Insufficient documentation

## 2024-02-08 LAB — ECHOCARDIOGRAM COMPLETE
AR max vel: 0.84 cm2
AV Area VTI: 0.81 cm2
AV Area mean vel: 0.84 cm2
AV Mean grad: 9.8 mmHg
AV Peak grad: 18 mmHg
Ao pk vel: 2.12 m/s
Area-P 1/2: 3.27 cm2
S' Lateral: 2.1 cm

## 2024-02-08 NOTE — Telephone Encounter (Signed)
 Shared response from Dr. Nishan with patient: Can order echo for AS   Echo ordered, scheduler to schedule appt.  Patient verbalized understanding.

## 2024-02-08 NOTE — Telephone Encounter (Signed)
 Patient was returning call for results. Please advise

## 2024-02-08 NOTE — Telephone Encounter (Signed)
 The patient has been notified of the result and verbalized understanding.  All questions (if any) were answered.  Roxie LITTIE Louder, RN 02/08/2024 5:31 PM

## 2024-02-10 DIAGNOSIS — H04123 Dry eye syndrome of bilateral lacrimal glands: Secondary | ICD-10-CM | POA: Diagnosis not present

## 2024-02-10 DIAGNOSIS — Z961 Presence of intraocular lens: Secondary | ICD-10-CM | POA: Diagnosis not present

## 2024-02-10 DIAGNOSIS — H35341 Macular cyst, hole, or pseudohole, right eye: Secondary | ICD-10-CM | POA: Diagnosis not present

## 2024-02-10 DIAGNOSIS — H353131 Nonexudative age-related macular degeneration, bilateral, early dry stage: Secondary | ICD-10-CM | POA: Diagnosis not present

## 2024-02-15 ENCOUNTER — Telehealth: Payer: Self-pay

## 2024-02-15 NOTE — Telephone Encounter (Signed)
 [place] paperwork approved

## 2024-03-07 DIAGNOSIS — L820 Inflamed seborrheic keratosis: Secondary | ICD-10-CM | POA: Diagnosis not present

## 2024-03-07 DIAGNOSIS — L308 Other specified dermatitis: Secondary | ICD-10-CM | POA: Diagnosis not present

## 2024-03-07 DIAGNOSIS — L853 Xerosis cutis: Secondary | ICD-10-CM | POA: Diagnosis not present

## 2024-03-07 DIAGNOSIS — I788 Other diseases of capillaries: Secondary | ICD-10-CM | POA: Diagnosis not present

## 2024-03-07 DIAGNOSIS — L570 Actinic keratosis: Secondary | ICD-10-CM | POA: Diagnosis not present

## 2024-03-07 DIAGNOSIS — L821 Other seborrheic keratosis: Secondary | ICD-10-CM | POA: Diagnosis not present

## 2024-03-07 DIAGNOSIS — D1801 Hemangioma of skin and subcutaneous tissue: Secondary | ICD-10-CM | POA: Diagnosis not present

## 2024-03-07 DIAGNOSIS — L700 Acne vulgaris: Secondary | ICD-10-CM | POA: Diagnosis not present

## 2024-03-07 DIAGNOSIS — L814 Other melanin hyperpigmentation: Secondary | ICD-10-CM | POA: Diagnosis not present

## 2024-03-07 DIAGNOSIS — Z85828 Personal history of other malignant neoplasm of skin: Secondary | ICD-10-CM | POA: Diagnosis not present

## 2024-03-23 DIAGNOSIS — M7061 Trochanteric bursitis, right hip: Secondary | ICD-10-CM | POA: Diagnosis not present

## 2024-03-23 DIAGNOSIS — G4700 Insomnia, unspecified: Secondary | ICD-10-CM | POA: Diagnosis not present

## 2024-03-23 DIAGNOSIS — M47816 Spondylosis without myelopathy or radiculopathy, lumbar region: Secondary | ICD-10-CM | POA: Diagnosis not present

## 2024-03-23 DIAGNOSIS — G894 Chronic pain syndrome: Secondary | ICD-10-CM | POA: Diagnosis not present

## 2024-03-23 NOTE — Progress Notes (Addendum)
 [facility] - Clinic Note  03/24/2024   CHIEF COMPLAINT Patient presents for Retina Evaluation  HISTORY OF PRESENT ILLNESS: Janet Giles is a 83 y.o. female who presents to the clinic today for:  HPI     Retina Evaluation   In both eyes.  This started 6 months ago.  Duration of 6 months.  Associated Symptoms Distortion and Photophobia.  Negative for Flashes, Floaters, Blind Spot, Pain, Redness, Glare, Trauma, Scalp Tenderness, Jaw Claudication, Shoulder/Hip pain, Fever, Weight Loss and Fatigue.  Context:  distance vision, mid-range vision and watching TV.  Treatments tried include artificial tears.  Response to treatment was no improvement.  I, the attending physician,  performed the HPI with the patient and updated documentation appropriately.        Comments   Pt states she noticed her vision becoming more blurry in the right eye about 6 months ago. Pt monitors the amsler grid and has wavy lines worse in the OD then the OS. Pt uses thera tears 4-6 times per day, she has had dry eye since cataract sx. Pt does warm compresses daily. Pt has 2 older sisters with glaucoma and her youngest sister has ARMD.      Last edited by Valdemar Rogue, MD on 03/27/2024  2:18 AM.     Referring physician: Octavia Charlie Hamilton, MD [address] [place],  [place] [address]  HISTORICAL INFORMATION:  Selected notes from the MEDICAL RECORD NUMBER Referred by Dr. CANDIE Octavia for ARMD eval LEE:  Ocular Hx- PMH-   CURRENT MEDICATIONS: No current outpatient medications on file. (Ophthalmic Drugs)   No current facility-administered medications for this visit. (Ophthalmic Drugs)   Current Outpatient Medications (Other)  Medication Sig   albuterol  (VENTOLIN  HFA) 108 (90 Base) MCG/ACT inhaler Inhale 1 puff into the lungs every 6 (six) hours as needed for wheezing or shortness of breath.   ALPHA LIPOIC ACID PO Take by mouth.   Calcium Carb-Cholecalciferol (CALCIUM + D3 PO)  Take 1 tablet by mouth daily.   Ferrous Sulfate (IRON) 325 (65 Fe) MG TABS Take 1 tablet by mouth.   gabapentin  (NEURONTIN ) 400 MG capsule Take 400 mg by mouth 2 (two) times daily.   Glycopyrrolate-Formoterol  (BEVESPI  AEROSPHERE) 9-4.8 MCG/ACT AERO Inhale 2 puffs into the lungs 2 (two) times daily.   HYDROcodone -acetaminophen  (NORCO/VICODIN) 5-325 MG tablet Take 1 tablet by mouth 2 (two) times daily as needed.   levothyroxine  (SYNTHROID , LEVOTHROID) 75 MCG tablet Take 75 mcg by mouth at bedtime.   Multiple Vitamin (ONE-A-DAY ESSENTIAL PO) Take by mouth.   nitrofurantoin, macrocrystal-monohydrate, (MACROBID) 100 MG capsule Take 100 mg by mouth 2 (two) times daily.   vitamin B-12 (CYANOCOBALAMIN) 500 MCG tablet Take 500 mcg by mouth every evening.   No current facility-administered medications for this visit. (Other)   REVIEW OF SYSTEMS: ROS   Positive for: Gastrointestinal, Musculoskeletal, Endocrine, Cardiovascular, Eyes, Respiratory Last edited by Elnor Avelina RAMAN, COT on 03/24/2024  9:29 AM.     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 Gaither   Degenerative joint disease of spine    buttocks pain -remains a problem   Family history of adverse reaction to anesthesia    one sister nasea and vomiting   Former smoker 08/13/2016   GERD (gastroesophageal reflux disease)    occ   High risk medication use 08/06/2016   History of total hip  replacement, right 08/13/2016   Hyperlipidemia    Hypothyroidism    Idiopathic peripheral neuropathy 08/13/2016   Lupus    Dr. Dolphus cushing   Neuromuscular disorder (HCC)    neuropathy in feet   PONV (postoperative nausea and vomiting)    Primary osteoarthritis of both feet 08/06/2016   Primary osteoarthritis of both hands 08/06/2016   Primary osteoarthritis of right hip 03/24/2015   Raynaud's disease without gangrene 08/06/2016   Shortness of breath dyspnea    uses inhaler occ   Systemic lupus erythematosus (HCC)    Past Surgical History:  Procedure Laterality Date   ABDOMINAL HYSTERECTOMY     BACK SURGERY  2002   L4-L5- fusion   BRONCHIAL BRUSHINGS  08/07/2019   Procedure: BRONCHIAL BRUSHINGS;  Surgeon: Gretta Leita SQUIBB, DO;  Location: [facility];  Service: Endoscopy;;   BRONCHIAL WASHINGS  08/07/2019   Procedure: BRONCHIAL WASHINGS;  Surgeon: Gretta Leita SQUIBB, DO;  Location: [facility];  Service: Endoscopy;;   CATARACT EXTRACTION     COLONOSCOPY WITH PROPOFOL  N/A 03/17/2016   Procedure: COLONOSCOPY WITH PROPOFOL ;  Surgeon: Gladis MARLA Louder, MD;  Location: [facility];  Service: Endoscopy;  Laterality: N/A;   ESOPHAGOGASTRODUODENOSCOPY (EGD) WITH PROPOFOL  N/A 03/17/2016   Procedure: ESOPHAGOGASTRODUODENOSCOPY (EGD) WITH PROPOFOL ;  Surgeon: Gladis MARLA Louder, MD;  Location: [facility];  Service: Endoscopy;  Laterality: N/A;   JOINT REPLACEMENT     TONSILLECTOMY AND ADENOIDECTOMY     TOTAL HIP ARTHROPLASTY Right 03/25/2015   Procedure: TOTAL HIP ARTHROPLASTY ANTERIOR APPROACH;  Surgeon: Dempsey Sensor, MD;  Location: [facility];  Service: Orthopedics;  Laterality: Right;   VIDEO BRONCHOSCOPY N/A 08/07/2019   Procedure: VIDEO BRONCHOSCOPY WITHOUT FLUORO;  Surgeon: Gretta Leita SQUIBB, DO;  Location: [facility];  Service: Endoscopy;  Laterality: N/A;   FAMILY HISTORY Family History  Problem Relation Age of Onset   Diabetes Mother    Heart disease Mother    Heart attack  Mother    Bleeding Disorder Father    Peptic Ulcer Father    Heart disease Sister    Heart attack Sister    Cancer Sister    Deep vein thrombosis Sister    Diabetes Sister    Hyperlipidemia Sister    Hypertension Sister    Varicose Veins Sister    AAA (abdominal aortic aneurysm) Sister    Bleeding Disorder Sister    Breast cancer Sister    Glaucoma Sister    Glaucoma Sister    Macular degeneration Sister    Heart disease Brother    Heart attack Brother    Hypertension Brother    SOCIAL HISTORY Social History   Tobacco Use   Smoking status: Former    Current packs/day: 0.00    Average packs/day: 1.5 packs/day for 30.0 years (45.0 ttl pk-yrs)    Types: Cigarettes    Start date: 05/11/1970    Quit date: 05/11/2000    Years since quitting: 23.8    Passive  exposure: Never   Smokeless tobacco: Never  Vaping Use   Vaping status: Never Used  Substance Use Topics   Alcohol use: Never    Alcohol/week: 0.0 standard drinks of alcohol   Drug use: Never       OPHTHALMIC EXAM:  Base Eye Exam     Visual Acuity (Snellen - Linear)       Right Left   Dist [place] 20/25 -2 20/20 -2   Dist ph [place] 20/20 -2          Tonometry (Tonopen, 9:22 AM)       Right Left   Pressure 19 20         Pupils       Pupils Dark Light Shape React APD   Right PERRL 3 2 Round Brisk None   Left PERRL 3 2 Round Brisk None         Visual Fields       Left Right    Full Full         Extraocular Movement       Right Left    Full, Ortho Full, Ortho         Neuro/Psych     Oriented x3: Yes         Dilation     Both eyes: 1.0% Mydriacyl, 2.5% Phenylephrine  @ 9:23 AM           Slit Lamp and Fundus Exam     External Exam       Right Left   External Normal Normal         Slit Lamp Exam       Right Left   Lids/Lashes Dermatochalasis - upper lid Dermatochalasis - upper lid   Conjunctiva/Sclera White and quiet White and quiet   Cornea Arcus, Well healed temporal  cataract wound, 1+ fine Punctate epithelial erosions Arcus, Well healed temporal cataract wound, trace fine Punctate epithelial erosions   Anterior Chamber Deep and clear Deep and clear   Iris Round and dilated Round and dilated   Lens PC IOL in good postition PC IOL in good postion with open PC   Anterior Vitreous Vitreous syneresis, Posterior vitreous detachment Vitreous syneresis, Posterior vitreous detachment         Fundus Exam       Right Left   Disc Trace Pallor, Sharp rim Trace Pallor, Sharp rim, Peripapillary atrophy   C/D Ratio 0.3 0.2   Macula Blunted foveal reflex, fine drusen, RPE mottling and clumping, No heme or edema Blunted foveal reflex, fine drusen, RPE mottling, No heme or edema   Vessels Vascular attenuation, Tortuous Vascular attenuation, Tortuous   Periphery Attached, No heme Attached, No heme           IMAGING AND PROCEDURES  Imaging and Procedures for 03/24/2024  OCT, Retina - OU - Both Eyes       Right Eye Quality was good. Central Foveal Thickness: 250. Progression has no prior data. Findings include normal foveal contour, no SRF, retinal drusen , pigment epithelial detachment, outer retinal atrophy (Scattered low PED-- focal cluster with ORA superior mac, focal intra retinal cyst temporal fovea).   Left Eye Quality was good. Central Foveal Thickness: 245. Progression has no prior data. Findings include normal foveal contour, no IRF, no SRF, retinal drusen .   Notes *Images captured and stored on drive  Diagnosis / Impression:  Nonexudative ARMD OU OD: Scattered low PED-- focal cluster with ORA superior mac, focal intra retinal  cyst temporal fovea OS: NFP; no IRF/SRF; +drusen  Clinical management:  See below  Abbreviations: NFP - Normal foveal profile. CME - cystoid macular edema. PED - pigment epithelial detachment. IRF - intraretinal fluid. SRF - subretinal fluid. EZ - ellipsoid zone. ERM - epiretinal membrane. ORA - outer retinal atrophy.  ORT - outer retinal tubulation. SRHM - subretinal hyper-reflective material. IRHM - intraretinal hyper-reflective material           ASSESSMENT/PLAN:   ICD-10-CM   1. Intermediate stage nonexudative age-related macular degeneration of both eyes  H35.3132 OCT, Retina - OU - Both Eyes    2. PVD (posterior vitreous detachment), both eyes  H43.813     3. Pseudophakia, both eyes  Z96.1      Age related macular degeneration, non-exudative, OU - The incidence, anatomy, and pathology of dry AMD, risk of progression, and the AREDS and AREDS 2 studies including smoking risks discussed with patient. - OCT shows: OD scattered low PED-- focal cluster with ORA superior mac, focal intra retinal cyst temporal fovea; OS NFP; no IRF/SRF; +druse  - BCVA OU 20/20  - Recommend amsler grid monitoring  - f/u 4-5 weeks, DFE, OCT  -- will recheck focal intraretinal cystic changes temporal fovea  2. PVD / vitreous syneresis, OU  - Discussed findings and prognosis  - No RT or RD on 360 exam  - Reviewed s/s of RT/RD  - Strict return precautions for any such RT/RD signs/symptoms  - monitor  3.  Pseudophakia OU  - s/p CE/IOL 2020 OS, 2022 OD w/ Dr. Octavia  - IOL in good position, doing well  - monitor   Ophthalmic Meds Ordered this visit:  No orders of the defined types were placed in this encounter.    Return in about 4 weeks (around 04/21/2024) for f/u, Non Ex. AMD, DFE, OCT.  There are no Patient Instructions on file for this visit.  Explained the diagnoses, plan, and follow up with the patient and they expressed understanding.  Patient expressed understanding of the importance of proper follow up care.   This document serves as a record of services personally performed by Redell JUDITHANN Hans, MD, PhD. It was created on their behalf by Wanda GEANNIE Keens, COT an ophthalmic technician. The creation of this record is the provider's dictation and/or activities during the visit.    Electronically signed  by:  Wanda GEANNIE Keens, COT  03/27/24 2:23 AM  Redell JUDITHANN Hans, M.D., Ph.D. Diseases & Surgery of the Retina and Vitreous [facility] 03/24/2024  I have reviewed the above documentation for accuracy and completeness, and I agree with the above. Redell JUDITHANN Hans, M.D., Ph.D. 03/27/24 2:23 AM   Abbreviations: M myopia (nearsighted); A astigmatism; H hyperopia (farsighted); P presbyopia; Mrx spectacle prescription;  CTL contact lenses; OD right eye; OS left eye; OU both eyes  XT exotropia; ET esotropia; PEK punctate epithelial keratitis; PEE punctate epithelial erosions; DES dry eye syndrome; MGD meibomian gland dysfunction; ATs artificial tears; PFAT's preservative free artificial tears; NSC nuclear sclerotic cataract; PSC posterior subcapsular cataract; ERM epi-retinal membrane; PVD posterior vitreous detachment; RD retinal detachment; DM diabetes mellitus; DR diabetic retinopathy; NPDR non-proliferative diabetic retinopathy; PDR proliferative diabetic retinopathy; CSME clinically significant macular edema; DME diabetic macular edema; dbh dot blot hemorrhages; CWS cotton wool spot; POAG primary open angle glaucoma; C/D cup-to-disc ratio; HVF humphrey visual field; GVF goldmann visual field; OCT optical coherence tomography; IOP intraocular pressure; BRVO Branch retinal vein occlusion; CRVO central  retinal vein occlusion; CRAO central retinal artery occlusion; BRAO branch retinal artery occlusion; RT retinal tear; SB scleral buckle; PPV pars plana vitrectomy; VH Vitreous hemorrhage; PRP panretinal laser photocoagulation; IVK intravitreal kenalog ; VMT vitreomacular traction; MH Macular hole;  NVD neovascularization of the disc; NVE neovascularization elsewhere; AREDS age related eye disease study; ARMD age related macular degeneration; POAG primary open angle glaucoma; EBMD epithelial/anterior basement membrane dystrophy; ACIOL anterior chamber intraocular lens; IOL intraocular lens;  PCIOL posterior chamber intraocular lens; Phaco/IOL phacoemulsification with intraocular lens placement; PRK photorefractive keratectomy; LASIK laser assisted in situ keratomileusis; HTN hypertension; DM diabetes mellitus; COPD chronic obstructive pulmonary disease

## 2024-03-24 ENCOUNTER — Ambulatory Visit (INDEPENDENT_AMBULATORY_CARE_PROVIDER_SITE_OTHER): Admitting: Ophthalmology

## 2024-03-24 ENCOUNTER — Encounter (INDEPENDENT_AMBULATORY_CARE_PROVIDER_SITE_OTHER): Payer: Self-pay | Admitting: Ophthalmology

## 2024-03-24 DIAGNOSIS — H43813 Vitreous degeneration, bilateral: Secondary | ICD-10-CM

## 2024-03-24 DIAGNOSIS — Z961 Presence of intraocular lens: Secondary | ICD-10-CM | POA: Diagnosis not present

## 2024-03-24 DIAGNOSIS — H353132 Nonexudative age-related macular degeneration, bilateral, intermediate dry stage: Secondary | ICD-10-CM

## 2024-03-24 DIAGNOSIS — H353131 Nonexudative age-related macular degeneration, bilateral, early dry stage: Secondary | ICD-10-CM

## 2024-03-27 ENCOUNTER — Encounter (INDEPENDENT_AMBULATORY_CARE_PROVIDER_SITE_OTHER): Payer: Self-pay | Admitting: Ophthalmology

## 2024-03-27 ENCOUNTER — Other Ambulatory Visit: Payer: Self-pay | Admitting: Physical Medicine and Rehabilitation

## 2024-03-27 DIAGNOSIS — M545 Low back pain, unspecified: Secondary | ICD-10-CM

## 2024-03-27 DIAGNOSIS — M4807 Spinal stenosis, lumbosacral region: Secondary | ICD-10-CM

## 2024-03-28 ENCOUNTER — Other Ambulatory Visit: Payer: Self-pay | Admitting: Physical Medicine and Rehabilitation

## 2024-03-28 DIAGNOSIS — M545 Low back pain, unspecified: Secondary | ICD-10-CM

## 2024-03-28 DIAGNOSIS — M4807 Spinal stenosis, lumbosacral region: Secondary | ICD-10-CM

## 2024-03-31 NOTE — Progress Notes (Signed)
 Office Visit Note  Patient: Janet Giles             Date of Birth: [birth year]           MRN: [id number]             PCP: [facility] Referring: No ref. provider found Visit Date: 04/13/2024 Occupation: Data Unavailable  Subjective:  pain in her index and middle finger on her right hand.   History of Present Illness: Janet Giles is a 83 y.o. 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  She returns today after her last visit in December 2024.  She states she continues to have pain and discomfort in her hands.  She states her right middle and index finger has been bothersome.  She continues to have some Raynauds symptoms.  Right total hip replacement is doing well.  She has been having some right gluteal discomfort.  She goes to pain management.  She states she will have lumbar spine MRI in the future.  She continues to have some stiffness in her neck and her feet.  She has ongoing discomfort from neuropathy.    Activities of Daily Living:  Patient reports morning stiffness for 2 hours.   Patient Reports nocturnal pain.  Difficulty dressing/grooming: Denies Difficulty climbing stairs: Denies Difficulty getting out of chair: Denies Difficulty using hands for taps, buttons, cutlery, and/or writing: Reports  Review of Systems  Constitutional:  Negative for fatigue.  HENT:  Positive for mouth dryness. Negative for mouth sores.   Eyes:  Positive for dryness.  Respiratory:  Negative for shortness of breath.   Cardiovascular:  Negative for chest pain and palpitations.  Gastrointestinal:  Negative for blood in stool, constipation and diarrhea.  Endocrine: Negative for increased urination.  Genitourinary:  Negative for involuntary urination.  Musculoskeletal:  Positive for joint pain, gait problem, joint pain, myalgias, morning stiffness, muscle tenderness and myalgias. Negative for joint swelling and muscle weakness.  Skin:  Positive for  color change and sensitivity to sunlight. Negative for rash and hair loss.  Allergic/Immunologic: Negative for susceptible to infections.  Neurological:  Negative for dizziness and headaches.  Hematological:  Negative for swollen glands.  Psychiatric/Behavioral:  Positive for sleep disturbance. Negative for depressed mood. The patient is not nervous/anxious.     PMFS History:  Patient Active Problem List   Diagnosis Date Noted  . Skin lesion of hand 06/20/2022  . Impacted cerumen of right ear 03/30/2022  . Bilateral impacted cerumen 03/30/2022  . Conductive hearing loss of right ear 03/30/2022  . Allergic rhinitis 10/22/2020  . Anemia 10/22/2020  . Asthma 10/22/2020  . Bronchiectasis (HCC) 10/22/2020  . Chronic obstructive pulmonary disease, unspecified (HCC) 10/22/2020  . History of bee sting allergy 10/22/2020  . Hypercholesterolemia 10/22/2020  . Hypothyroidism 10/22/2020  . Loss of appetite 10/22/2020  . Osteopenia 10/22/2020  . Neuralgia 10/17/2020  . Levator spasm 10/04/2020  . 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 Idiopathic peripheral neuropathy 08/13/2016  . 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 Former smoker 08/13/2016  . ANA positive 08/06/2016  . Raynaud's disease without gangrene 08/06/2016  . Primary osteoarthritis of both hands 08/06/2016  . Primary osteoarthritis of both feet 08/06/2016  . High risk medication use 08/06/2016  . Primary osteoarthritis of right hip 03/24/2015  . Aortic ectasia, abdominal 05/26/2013    Past Medical History:  Diagnosis Date  . AAA (abdominal aortic aneurysm)   . ANA positive 08/06/2016  .  Aortic ectasia, abdominal 05/26/2013  . Asthma   . Complication of anesthesia    trouble breathing after back surgery  . COPD (chronic obstructive pulmonary disease) (HCC)   . 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 Gaither  . Degenerative joint disease of spine    buttocks pain -remains a problem  . Family history of  adverse reaction to anesthesia    one sister nasea and vomiting  . Former smoker 08/13/2016  . GERD (gastroesophageal reflux disease)    occ  . High risk medication use 08/06/2016  . 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 Hyperlipidemia   . Hypothyroidism   . Idiopathic peripheral neuropathy 08/13/2016  . Lupus    Dr. Dolphus cushing  . Neuromuscular disorder (HCC)    neuropathy in feet  . PONV (postoperative nausea and vomiting)   . Primary osteoarthritis of both feet 08/06/2016  . Primary osteoarthritis of both hands 08/06/2016  . Primary osteoarthritis of right hip 03/24/2015  . Raynaud's disease without gangrene 08/06/2016  . Shortness of breath dyspnea    uses inhaler occ  . Systemic lupus erythematosus (HCC)     Family History  Problem Relation Age of Onset  . Diabetes Mother   . Heart disease Mother   . Heart attack Mother   . Bleeding Disorder Father   . Peptic Ulcer Father   . Heart disease Sister   . Heart attack Sister   . Cancer Sister   . Deep vein thrombosis Sister   . Diabetes Sister   . Hyperlipidemia Sister   . Hypertension Sister   . Varicose Veins Sister   . AAA (abdominal aortic aneurysm) Sister   . Bleeding Disorder Sister   . Breast cancer Sister   . Glaucoma Sister   . Glaucoma Sister   . Macular degeneration Sister   . Heart disease Brother   . Heart attack Brother   . Hypertension Brother    Past Surgical History:  Procedure Laterality Date  . ABDOMINAL HYSTERECTOMY    . ABLATION  05/2023   Back  . BACK SURGERY  2002   L4-L5- fusion  . BRONCHIAL BRUSHINGS  08/07/2019   Procedure: BRONCHIAL BRUSHINGS;  Surgeon: Gretta Leita SQUIBB, DO;  Location: [facility];  Service: Endoscopy;;  . BRONCHIAL WASHINGS  08/07/2019   Procedure: BRONCHIAL WASHINGS;  Surgeon: Gretta Leita SQUIBB, DO;  Location: [facility];  Service: Endoscopy;;  . CATARACT EXTRACTION    . COLONOSCOPY WITH PROPOFOL  N/A 03/17/2016   Procedure: COLONOSCOPY  WITH PROPOFOL ;  Surgeon: Gladis MARLA Louder, MD;  Location: [facility];  Service: Endoscopy;  Laterality: N/A;  . ESOPHAGOGASTRODUODENOSCOPY (EGD) WITH PROPOFOL  N/A 03/17/2016   Procedure: ESOPHAGOGASTRODUODENOSCOPY (EGD) WITH PROPOFOL ;  Surgeon: Gladis MARLA Louder, MD;  Location: [facility];  Service: Endoscopy;  Laterality: N/A;  . JOINT REPLACEMENT    . TONSILLECTOMY AND ADENOIDECTOMY    . TOTAL HIP ARTHROPLASTY Right 03/25/2015   Procedure: TOTAL HIP ARTHROPLASTY ANTERIOR APPROACH;  Surgeon: Dempsey Sensor, MD;  Location: [facility];  Service: Orthopedics;  Laterality: Right;  SABRA VIDEO BRONCHOSCOPY N/A 08/07/2019   Procedure: VIDEO BRONCHOSCOPY WITHOUT FLUORO;  Surgeon: Gretta Leita SQUIBB, DO;  Location: [facility];  Service: Endoscopy;  Laterality: N/A;   Social History   Tobacco Use  . Smoking status: Former    Current packs/day: 0.00    Average packs/day: 1.5 packs/day for 30.0 years (45.0 ttl pk-yrs)    Types: Cigarettes    Start date: 05/11/1970  Quit date: 05/11/2000    Years since quitting: 23.9    Passive exposure: Never  . Smokeless tobacco: Never  Vaping Use  . Vaping status: Never Used  Substance Use Topics  . Alcohol use: Never    Alcohol/week: 0.0 standard drinks of alcohol  . Drug use: Never   Social History   Social History Narrative   Right handed   2 cups of caffeine daily   Lives at home with husband     Immunization History  Administered Date(s) Administered  . Fluad Quad(high Dose 65+) 02/20/2019  . PFIZER(Purple Top)SARS-COV-2 Vaccination 05/24/2019, 06/14/2019, 02/26/2020  . Pneumococcal Conjugate-13 08/14/2014  . Pneumococcal Polysaccharide-23 10/11/1998, 06/29/2008  . Td 08/13/2016  . Tdap 06/29/2005     Objective: Vital Signs: BP 105/65   Pulse 69   Temp 98.2 F (36.8 C)   Resp 12   Ht 5' 4 (1.626 m)   Wt 135 lb 6.4 oz (61.4 kg)   BMI 23.24 kg/m    Physical Exam Vitals and nursing note reviewed.  Constitutional:      Appearance: She is  well-developed.  HENT:     Head: Normocephalic and atraumatic.  Eyes:     Conjunctiva/sclera: Conjunctivae normal.  Cardiovascular:     Rate and Rhythm: Normal rate and regular rhythm.     Heart sounds: Normal heart sounds.  Pulmonary:     Effort: Pulmonary effort is normal.     Breath sounds: Normal breath sounds.  Abdominal:     General: Bowel sounds are normal.     Palpations: Abdomen is soft.  Musculoskeletal:     Cervical back: Normal range of motion.  Lymphadenopathy:     Cervical: No cervical adenopathy.  Skin:    General: Skin is warm and dry.     Capillary Refill: Capillary refill takes less than 2 seconds.  Neurological:     Mental Status: She is alert and oriented to person, place, and time.  Psychiatric:        Behavior: Behavior normal.      Musculoskeletal Exam: She has some limitation with lateral rotation of the cervical spine.  There was no tenderness over thoracic or lumbar spine.  Shoulders, elbow joints, wrist joints, MCPs with good range of motion.  She had bilateral CMC subluxation.  PIP and DIP thickening was noted.  No synovitis was noted.  Hip joints and knee joints were in good range of motion.  She tenderness over right trochanteric bursa.  There was no tenderness over ankles or MTPs.  CDAI Exam: CDAI Score: -- Patient Global: --; Provider Global: -- Swollen: --; Tender: -- Joint Exam 04/13/2024   No joint exam has been documented for this visit   There is currently no information documented on the homunculus. Go to the Rheumatology activity and complete the homunculus joint exam.  Investigation: No additional findings.  Imaging: OCT, Retina - OU - Both Eyes Result Date: 03/27/2024 Right Eye Quality was good. Central Foveal Thickness: 250. Progression has no prior data. Findings include normal foveal contour, no SRF, retinal drusen , pigment epithelial detachment, outer retinal atrophy (Scattered low PED-- focal cluster with ORA superior mac,  focal intra retinal cyst temporal fovea). Left Eye Quality was good. Central Foveal Thickness: 245. Progression has no prior data. Findings include normal foveal contour, no IRF, no SRF, retinal drusen . Notes *Images captured and stored on drive Diagnosis / Impression: Nonexudative ARMD OU OD: Scattered low PED-- focal cluster with ORA superior mac, focal intra  retinal cyst temporal fovea OS: NFP; no IRF/SRF; +drusen Clinical management: See below Abbreviations: NFP - Normal foveal profile. CME - cystoid macular edema. PED - pigment epithelial detachment. IRF - intraretinal fluid. SRF - subretinal fluid. EZ - ellipsoid zone. ERM - epiretinal membrane. ORA - outer retinal atrophy. ORT - outer retinal tubulation. SRHM - subretinal hyper-reflective material. IRHM - intraretinal hyper-reflective material    Recent Labs: Lab Results  Component Value Date   WBC 4.9 09/22/2023   HGB 10.2 (L) 09/22/2023   PLT 323 09/22/2023   NA 137 09/22/2023   K 4.3 09/22/2023   CL 102 09/22/2023   CO2 24 09/22/2023   GLUCOSE 153 (H) 09/22/2023   BUN 19 09/22/2023   CREATININE 1.09 (H) 09/22/2023   BILITOT 0.4 09/22/2023   ALKPHOS 50 09/22/2023   AST 22 09/22/2023   ALT 13 09/22/2023   PROT 6.6 09/22/2023   ALBUMIN 4.1 09/22/2023   CALCIUM 9.8 09/22/2023   GFRAA 78 10/10/2019    Speciality Comments: No specialty comments available.  Procedures:  Large Joint Inj: R greater trochanter on 04/13/2024 11:28 AM Indications: pain Details: 27 G 1.5 in needle, lateral approach  Arthrogram: No  Medications: 40 mg triamcinolone  acetonide 40 MG/ML; 1.5 mL lidocaine  1 % Aspirate: 0 mL Outcome: tolerated well, no immediate complications  Risk of infection, tendon injury, nerve injury, hypopigmentation and dermal atrophy were discussed. Procedure, treatment alternatives, risks and benefits explained, specific risks discussed. Consent was given by the patient. Immediately prior to procedure a time out was called to  verify the correct patient, procedure, equipment, support staff and site/side marked as required. Patient was prepped and draped in the usual sterile fashion.     Allergies: Bee venom, Wasp venom, Doxycycline, Lovastatin, and Naproxen   Assessment / Plan:     Visit Diagnoses: Autoimmune disease - +ANA 1:40, ds DNA negative Raynauds , history of skin lupus biopsy proven by Dr. Lomax per patient: Patient had no recurrence of rash.  She has no clinical features of lupus.  No further workup is noted.  Raynaud's disease without gangrene-she gets mild Raynaud's symptoms.  Keeping core temperature warm was discussed.  Primary osteoarthritis of both hands-he continues to have pain and discomfort in her hands especially over the DIP joints and PIP joints.  She also complains of discomfort in her CMC joints.  She states her right index and middle finger DIPs have been painful.  The pain has eased off.  But if the symptoms recur she may contact us  to get cortisone injection.  She states she had good response to cortisone injections to her hands in the past.  Trigger middle finger of right hand - Resolved.  History of total hip replacement, right - Dr. Liam.  Trochanteric bursitis, right hip-she has been having discomfort over right trochanteric region.  IT band stretches were discussed.  The patient's request right trochanteric bursa was injected with lidocaine  and Kenalog  as described above.  Patient tolerated the procedure well.  Postprocedure instructions were given.  Primary osteoarthritis of both feet-she continues to have some stiffness in her feet.  Neck stiffness-she has neck stiffness.  Range of motion exercises were demonstrated.  Spondylosis of lumbar spine - She had surgery in the past.  She will follow-up with Dr. Beuford.  Followed by pain management.  She gets injections as needed.  Patient states recently she has been having right-sided radiculopathy.  She will have MRI after  Christmas.  Osteopenia of multiple sites - followed  by her PCP.  Idiopathic peripheral neuropathy  Aortic ectasia, abdominal  History of COPD - Followed by Dr. Kara.  Other fatigue  Former smoker  Orders: Orders Placed This Encounter  Procedures  . Large Joint Inj   No orders of the defined types were placed in this encounter.    Follow-Up Instructions: Return for Osteoarthritis,CLE.   Maya Nash, MD  Note - This record has been created using Animal nutritionist.  Chart creation errors have been sought, but may not always  have been located. Such creation errors do not reflect on  the standard of medical care.

## 2024-04-10 NOTE — Progress Notes (Signed)
 [facility] - Clinic Note  04/24/2024   CHIEF COMPLAINT Patient presents for No chief complaint on file.  HISTORY OF PRESENT ILLNESS: Janet Giles is a 83 y.o. female who presents to the clinic today for:    Referring physician: Octavia, Charlie Hamilton, MD [address] [place],  [place] [address]  HISTORICAL INFORMATION:  Selected notes from the MEDICAL RECORD NUMBER Referred by Dr. CANDIE Octavia for ARMD eval LEE:  Ocular Hx- PMH-   CURRENT MEDICATIONS: No current outpatient medications on file. (Ophthalmic Drugs)   No current facility-administered medications for this visit. (Ophthalmic Drugs)   Current Outpatient Medications (Other)  Medication Sig   albuterol  (VENTOLIN  HFA) 108 (90 Base) MCG/ACT inhaler Inhale 1 puff into the lungs every 6 (six) hours as needed for wheezing or shortness of breath.   ALPHA LIPOIC ACID PO Take by mouth.   Calcium Carb-Cholecalciferol (CALCIUM + D3 PO) Take 1 tablet by mouth daily.   Ferrous Sulfate (IRON) 325 (65 Fe) MG TABS Take 1 tablet by mouth.   gabapentin  (NEURONTIN ) 400 MG capsule Take 400 mg by mouth 2 (two) times daily.   Glycopyrrolate-Formoterol  (BEVESPI  AEROSPHERE) 9-4.8 MCG/ACT AERO Inhale 2 puffs into the lungs 2 (two) times daily.   HYDROcodone -acetaminophen  (NORCO/VICODIN) 5-325 MG tablet Take 1 tablet by mouth 2 (two) times daily as needed.   levothyroxine  (SYNTHROID , LEVOTHROID) 75 MCG tablet Take 75 mcg by mouth at bedtime.   Multiple Vitamin (ONE-A-DAY ESSENTIAL PO) Take by mouth.   nitrofurantoin, macrocrystal-monohydrate, (MACROBID) 100 MG capsule Take 100 mg by mouth 2 (two) times daily.   vitamin B-12 (CYANOCOBALAMIN) 500 MCG tablet Take 500 mcg by mouth every evening.   No current facility-administered medications for this visit. 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 Dolphus cushing   Neuromuscular disorder (HCC)    neuropathy in feet   PONV (postoperative nausea and vomiting)    Primary osteoarthritis of both feet 08/06/2016   Primary osteoarthritis of both hands 08/06/2016   Primary osteoarthritis of right hip 03/24/2015   Raynaud's disease without gangrene 08/06/2016   Shortness of breath dyspnea    uses inhaler occ   Systemic lupus erythematosus (HCC)    Past Surgical History:  Procedure Laterality Date   ABDOMINAL HYSTERECTOMY     BACK SURGERY  2002  L4-L5- fusion   BRONCHIAL BRUSHINGS  08/07/2019   Procedure: BRONCHIAL BRUSHINGS;  Surgeon: Gretta Leita SQUIBB, DO;  Location: [facility];  Service: Endoscopy;;   BRONCHIAL WASHINGS  08/07/2019   Procedure: BRONCHIAL WASHINGS;  Surgeon: Gretta Leita SQUIBB, DO;  Location: [facility];  Service: Endoscopy;;   CATARACT EXTRACTION     COLONOSCOPY WITH PROPOFOL  N/A 03/17/2016   Procedure: COLONOSCOPY WITH PROPOFOL ;  Surgeon: Gladis MARLA Louder, MD;  Location: [facility];  Service: Endoscopy;   Laterality: N/A;   ESOPHAGOGASTRODUODENOSCOPY (EGD) WITH PROPOFOL  N/A 03/17/2016   Procedure: ESOPHAGOGASTRODUODENOSCOPY (EGD) WITH PROPOFOL ;  Surgeon: Gladis MARLA Louder, MD;  Location: [facility];  Service: Endoscopy;  Laterality: N/A;   JOINT REPLACEMENT     TONSILLECTOMY AND ADENOIDECTOMY     TOTAL HIP ARTHROPLASTY Right 03/25/2015   Procedure: TOTAL HIP ARTHROPLASTY ANTERIOR APPROACH;  Surgeon: Dempsey Sensor, MD;  Location: [facility];  Service: Orthopedics;  Laterality: Right;   VIDEO BRONCHOSCOPY N/A 08/07/2019   Procedure: VIDEO BRONCHOSCOPY WITHOUT FLUORO;  Surgeon: Gretta Leita SQUIBB, DO;  Location: [facility];  Service: Endoscopy;  Laterality: N/A;   FAMILY HISTORY Family History  Problem Relation Age of Onset   Diabetes Mother    Heart disease Mother    Heart attack Mother    Bleeding Disorder Father    Peptic Ulcer Father    Heart disease Sister    Heart attack Sister    Cancer Sister    Deep vein thrombosis Sister    Diabetes Sister    Hyperlipidemia Sister    Hypertension Sister    Varicose Veins Sister    AAA (abdominal aortic aneurysm) Sister    Bleeding Disorder Sister    Breast cancer Sister    Glaucoma Sister    Glaucoma Sister    Macular degeneration Sister    Heart disease Brother    Heart attack Brother    Hypertension Brother    SOCIAL HISTORY Social History   Tobacco Use   Smoking status: Former    Current packs/day: 0.00    Average packs/day: 1.5 packs/day for 30.0 years (45.0 ttl pk-yrs)    Types: Cigarettes    Start date: 05/11/1970    Quit date: 05/11/2000    Years since quitting: 23.9    Passive exposure: Never   Smokeless tobacco: Never  Vaping Use   Vaping status: Never Used  Substance Use Topics   Alcohol use: Never    Alcohol/week: 0.0 standard drinks of alcohol   Drug use: Never       OPHTHALMIC EXAM:  Not recorded    IMAGING AND PROCEDURES  Imaging and Procedures for 04/24/2024         ASSESSMENT/PLAN:   ICD-10-CM   1.  Intermediate stage nonexudative age-related macular degeneration of both eyes  H35.3132     2. PVD (posterior vitreous detachment), both eyes  H43.813     3. Pseudophakia, both eyes  Z96.1       Age related macular degeneration, non-exudative, OU - The incidence, anatomy, and pathology of dry AMD, risk of progression, and the AREDS and AREDS 2 studies including smoking risks discussed with patient. - OCT shows: OD scattered low PED-- focal cluster with ORA superior mac, focal intra retinal cyst temporal fovea; OS NFP; no IRF/SRF; +druse  - BCVA OU 20/20  - Recommend amsler grid monitoring  - f/u 4-5 weeks, DFE, OCT  -- will recheck focal intraretinal cystic changes temporal fovea  2. PVD / vitreous syneresis, OU  -  Discussed findings and prognosis  - No RT or RD on 360 exam  - Reviewed s/s of RT/RD  - Strict return precautions for any such RT/RD signs/symptoms  - monitor  3.  Pseudophakia OU  - s/p CE/IOL 2020 OS, 2022 OD w/ Dr. Octavia  - IOL in good position, doing well  - monitor   Ophthalmic Meds Ordered this visit:  No orders of the defined types were placed in this encounter.    No follow-ups on file.  There are no Patient Instructions on file for this visit.  Explained the diagnoses, plan, and follow up with the patient and they expressed understanding.  Patient expressed understanding of the importance of proper follow up care.   This document serves as a record of services personally performed by Redell JUDITHANN Hans, MD, PhD. It was created on their behalf by Avelina Pereyra, COA an ophthalmic technician. The creation of this record is the provider's dictation and/or activities during the visit.   Electronically signed by: Avelina GORMAN Pereyra, COT  04/10/24  10:45 AM   Redell JUDITHANN Hans, M.D., Ph.D. Diseases & Surgery of the Retina and Vitreous [facility] 04/24/2024  Abbreviations: M myopia (nearsighted); A astigmatism; H hyperopia (farsighted); P  presbyopia; Mrx spectacle prescription;  CTL contact lenses; OD right eye; OS left eye; OU both eyes  XT exotropia; ET esotropia; PEK punctate epithelial keratitis; PEE punctate epithelial erosions; DES dry eye syndrome; MGD meibomian gland dysfunction; ATs artificial tears; PFAT's preservative free artificial tears; NSC nuclear sclerotic cataract; PSC posterior subcapsular cataract; ERM epi-retinal membrane; PVD posterior vitreous detachment; RD retinal detachment; DM diabetes mellitus; DR diabetic retinopathy; NPDR non-proliferative diabetic retinopathy; PDR proliferative diabetic retinopathy; CSME clinically significant macular edema; DME diabetic macular edema; dbh dot blot hemorrhages; CWS cotton wool spot; POAG primary open angle glaucoma; C/D cup-to-disc ratio; HVF humphrey visual field; GVF goldmann visual field; OCT optical coherence tomography; IOP intraocular pressure; BRVO Branch retinal vein occlusion; CRVO central retinal vein occlusion; CRAO central retinal artery occlusion; BRAO branch retinal artery occlusion; RT retinal tear; SB scleral buckle; PPV pars plana vitrectomy; VH Vitreous hemorrhage; PRP panretinal laser photocoagulation; IVK intravitreal kenalog ; VMT vitreomacular traction; MH Macular hole;  NVD neovascularization of the disc; NVE neovascularization elsewhere; AREDS age related eye disease study; ARMD age related macular degeneration; POAG primary open angle glaucoma; EBMD epithelial/anterior basement membrane dystrophy; ACIOL anterior chamber intraocular lens; IOL intraocular lens; PCIOL posterior chamber intraocular lens; Phaco/IOL phacoemulsification with intraocular lens placement; PRK photorefractive keratectomy; LASIK laser assisted in situ keratomileusis; HTN hypertension; DM diabetes mellitus; COPD chronic obstructive pulmonary disease

## 2024-04-13 ENCOUNTER — Ambulatory Visit: Payer: Medicare Other | Attending: Rheumatology | Admitting: Rheumatology

## 2024-04-13 ENCOUNTER — Encounter: Payer: Self-pay | Admitting: Rheumatology

## 2024-04-13 VITALS — BP 105/65 | HR 69 | Temp 98.2°F | Resp 12 | Ht 64.0 in | Wt 135.4 lb

## 2024-04-13 DIAGNOSIS — M359 Systemic involvement of connective tissue, unspecified: Secondary | ICD-10-CM | POA: Insufficient documentation

## 2024-04-13 DIAGNOSIS — I77811 Abdominal aortic ectasia: Secondary | ICD-10-CM | POA: Diagnosis not present

## 2024-04-13 DIAGNOSIS — M19042 Primary osteoarthritis, left hand: Secondary | ICD-10-CM | POA: Insufficient documentation

## 2024-04-13 DIAGNOSIS — R5383 Other fatigue: Secondary | ICD-10-CM | POA: Diagnosis not present

## 2024-04-13 DIAGNOSIS — M7061 Trochanteric bursitis, right hip: Secondary | ICD-10-CM | POA: Diagnosis not present

## 2024-04-13 DIAGNOSIS — G609 Hereditary and idiopathic neuropathy, unspecified: Secondary | ICD-10-CM | POA: Insufficient documentation

## 2024-04-13 DIAGNOSIS — M65331 Trigger finger, right middle finger: Secondary | ICD-10-CM | POA: Insufficient documentation

## 2024-04-13 DIAGNOSIS — M8589 Other specified disorders of bone density and structure, multiple sites: Secondary | ICD-10-CM | POA: Diagnosis not present

## 2024-04-13 DIAGNOSIS — M436 Torticollis: Secondary | ICD-10-CM | POA: Diagnosis not present

## 2024-04-13 DIAGNOSIS — M47816 Spondylosis without myelopathy or radiculopathy, lumbar region: Secondary | ICD-10-CM | POA: Insufficient documentation

## 2024-04-13 DIAGNOSIS — M19041 Primary osteoarthritis, right hand: Secondary | ICD-10-CM | POA: Insufficient documentation

## 2024-04-13 DIAGNOSIS — Z87891 Personal history of nicotine dependence: Secondary | ICD-10-CM | POA: Diagnosis not present

## 2024-04-13 DIAGNOSIS — I7300 Raynaud's syndrome without gangrene: Secondary | ICD-10-CM | POA: Insufficient documentation

## 2024-04-13 DIAGNOSIS — M19071 Primary osteoarthritis, right ankle and foot: Secondary | ICD-10-CM | POA: Diagnosis not present

## 2024-04-13 DIAGNOSIS — M19072 Primary osteoarthritis, left ankle and foot: Secondary | ICD-10-CM | POA: Diagnosis not present

## 2024-04-13 DIAGNOSIS — Z96641 Presence of right artificial hip joint: Secondary | ICD-10-CM | POA: Insufficient documentation

## 2024-04-13 DIAGNOSIS — Z8709 Personal history of other diseases of the respiratory system: Secondary | ICD-10-CM | POA: Diagnosis not present

## 2024-04-13 MED ORDER — LIDOCAINE HCL 1 % IJ SOLN
1.5000 mL | INTRAMUSCULAR | Status: AC | PRN
  Administered 2024-04-13: 1.5 mL

## 2024-04-13 MED ORDER — TRIAMCINOLONE ACETONIDE 40 MG/ML IJ SUSP
40.0000 mg | INTRAMUSCULAR | Status: AC | PRN
  Administered 2024-04-13: 40 mg via INTRA_ARTICULAR

## 2024-04-13 NOTE — Patient Instructions (Signed)
 Iliotibial Band Syndrome Rehab Ask your health care provider which exercises are safe for you. Do exercises exactly as told by your provider and adjust them as told. It's normal to feel mild stretching, pulling, tightness, or discomfort as you do these exercises. Stop right away if you feel sudden pain or your pain gets a lot worse. Do not begin these exercises until told by your provider. Stretching and range-of-motion exercises These exercises warm up your muscles and joints. They also improve the movement and flexibility of your hip and pelvis. Quadriceps stretch, prone  Lie face down (prone) on a firm surface like a bed or padded floor. Bend your left / right knee. Reach back to hold your ankle or pant leg. If you can't reach your ankle or pant leg, use a belt looped around your foot and grab the belt instead. Gently pull your heel toward your butt. Your knee should not slide out to the side. You should feel a stretch in the front of your thigh and knee, also called the quadriceps. Hold this position for __________ seconds. Repeat __________ times. Complete this exercise __________ times a day. Iliotibial band stretch The iliotibial band is a strip of tissue that runs along the outside of your hip down to your knee. Lie on your side with your left / right leg on top. Bend both knees and grab your left / right ankle. Stretch out your bottom arm to help you balance. Slowly bring your top knee back so your thigh goes behind your back. Slowly lower your top leg toward the floor until you feel a gentle stretch on the outside of your left / right hip and thigh. If you don't feel a stretch and your knee won't go farther, place the heel of your other foot on top of your knee and pull your knee down toward the floor with your foot. Hold this position for __________ seconds. Repeat __________ times. Complete this exercise __________ times a day. Strengthening exercises These exercises build strength  and endurance in your hip and pelvis. Endurance means your muscles can keep working even when they're tired. Straight leg raises, side-lying This exercise strengthens the muscles that rotate the leg at the hip and move it away from your body. These muscles are called hip abductors. Lie on your side with your left / right leg on top. Lie so your head, shoulder, hip, and knee line up. You can bend your bottom knee to help you balance. Roll your hips slightly forward so they're stacked directly over each other. Your left / right knee should face forward. Tense the muscles in your outer thigh and hip. Lift your top leg 4-6 inches (10-15 cm) off the ground. Hold this position for __________ seconds. Slowly lower your leg back down to the starting position. Let your muscles fully relax before doing this exercise again. Repeat __________ times. Complete this exercise __________ times a day. Leg raises, prone This exercise strengthens the muscles that move the hips backward. These muscles are called hip extensors. Lie face down (prone) on your bed or a firm surface. You can put a pillow under your hips for comfort and to support your lower back. Bend your left / right knee so your foot points straight up toward the ceiling. Keep the other leg straight and behind you. Squeeze your butt muscles. Lift your left / right thigh off the firm surface. Do not let your back arch. Tense your thigh muscle as hard as you can without having  more knee pain. Hold this position for __________ seconds. Slowly lower your leg to the starting position. Allow your leg to relax all the way. Repeat __________ times. Complete this exercise __________ times a day. Hip hike  Stand sideways on a bottom step. Place your feet so that your left / right leg is on the step, and the other foot is hanging off the side. If you need support for balance, hold onto a railing or wall. Keep your knees straight and your abdomen square,  meaning your hips are level. Then, lift your left / right hip up toward the ceiling. Slowly let your leg that's hanging off the step lower towards the floor. Your foot should get closer to the ground. Do not lean or bend your knees during this movement. Repeat __________ times. Complete this exercise __________ times a day. This information is not intended to replace advice given to you by your health care provider. Make sure you discuss any questions you have with your health care provider. Document Revised: 07/10/2022 Document Reviewed: 07/10/2022 Elsevier Patient Education  2024 [employer].

## 2024-04-13 NOTE — Progress Notes (Signed)
 CARDIOLOGY CONSULT NOTE       Patient ID: Taydem Cavagnaro MRN: [id number] DOB/AGE: 1940-10-18 83 y.o.   HPI:  83 y.o. f/u for aortic valve disease Echo done 05/08/19 with tri leaflet AV mean gradient Only 7 mmHg normal EF and mild MR She has also had serial abdominal US  for AAA but most recent 04/27/19 only 2.7 cm unchanged from 2018 She has history of hypothyroidism As well as asthma and neuropathy in her feet takes Gabapentin  and sees Dr Jenel in neurology   2023 had exertional dyspnea  worse last few months. Diagnosed with asthma Has not had PFTls in years No history of DVT/ PE or connective tissue disease   CT done 03/27/20 sowed RUL bronchiectasis with tree bud nodularity along with ground glass attenuation in left lung base Seen by [facility] Dr Gretta 11/22/19 improved on Bevespi  and using Albuterol  less She had bronchoscopy 08/07/19 with negative cultures  Dr Gretta felt a lot of the changes from mucoid impaction rather than active infection   Biggest issue is peripheral neuropathy in feet Back pain Skin Lupus with positive ANA And arthritis in hands and feet She has seen neurology and orthopedics Seems mad that She got taken off narcotic pain meds last July 2023  Updated TTE 05/27/21 reviewed EF 60-65% mild AS mean gradient 9 peak 16 mmHg DVI 0.42 Most recent echo done 02/08/24 stable with mean grdient 9.8 and peak 18 mmHg DVI 0.26 and AVA 0.84.   Still with buttock pain that radiates to private parts ? 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 Dolphus cushing   Neuromuscular disorder (HCC)    neuropathy in feet   PONV (postoperative nausea and vomiting)    Primary osteoarthritis of both feet 08/06/2016   Primary osteoarthritis of both hands 08/06/2016   Primary osteoarthritis of right hip 03/24/2015   Raynaud's disease without gangrene 08/06/2016   Shortness of breath dyspnea    uses inhaler occ   Systemic lupus erythematosus (HCC)     Family History  Problem Relation Age of Onset   Diabetes Mother    Heart disease Mother    Heart attack Mother    Bleeding Disorder Father    Peptic Ulcer Father    Heart disease Sister    Heart attack Sister    Cancer Sister    Deep vein thrombosis Sister    Diabetes Sister    Hyperlipidemia Sister    Hypertension Sister    Varicose Veins Sister    AAA (abdominal aortic aneurysm) Sister  Bleeding Disorder Sister    Breast cancer Sister    Glaucoma Sister    Glaucoma Sister    Macular degeneration Sister    Heart disease Brother    Heart attack Brother    Hypertension Brother     Social History   Socioeconomic History   Marital status: Married    Spouse name: Not on file   Number of children: 3   Years of education: Not on file   Highest education level: Some college, no degree  Occupational History   Occupation: Retired  Tobacco Use   Smoking status: Former    Current packs/day: 0.00    Average packs/day: 1.5 packs/day for 30.0 years (45.0 ttl pk-yrs)    Types: Cigarettes    Start date: 05/11/1970    Quit date: 05/11/2000    Years  since quitting: 23.9    Passive exposure: Never   Smokeless tobacco: Never  Vaping Use   Vaping status: Never Used  Substance and Sexual Activity   Alcohol use: Never    Alcohol/week: 0.0 standard drinks of alcohol   Drug use: Never   Sexual activity: Not Currently  Other Topics Concern   Not on file  Social History Narrative   Right handed   2 cups of caffeine daily   Lives at home with husband   Social Drivers of Health   Tobacco Use: Medium Risk (04/20/2024)   Patient History    Smoking Tobacco Use: Former    Smokeless Tobacco Use: Never    Passive Exposure: Never  Physicist, Medical Strain: Not on file  Food Insecurity: Low Risk (12/17/2022)   Received from [facility]   Epic    Within the past 12 months, you worried that your food would run out before you got money to buy more: Never true    Within the past 12 months, the food you bought just didn't last and you didn't have money to get more. : Never true  Transportation Needs: No Transportation Needs (12/17/2022)   Received from [facility]    In the past 12 months, has lack of reliable transportation kept you from medical appointments, meetings, work or from getting things needed for daily living? : No  Physical Activity: Not on file  Stress: Not on file  Social Connections: Not on file  Intimate Partner Violence: Not on file  Depression (EYV7-0): Not on file  Alcohol Screen: Not on file  Housing: Low Risk (12/17/2022)   Received from [facility]   Epic    What is your living situation today?: I have a steady place to live    Think about the place you live. Do you have problems with any of the following? Choose all that apply:: None/None on this list  Utilities: Low Risk (12/17/2022)   Received from [facility]   Utilities    In the past 12 months has the electric, gas, oil, or water company threatened to shut off services in your home? : No  Health Literacy: Not on file    Past Surgical  History:  Procedure Laterality Date   ABDOMINAL HYSTERECTOMY     ABLATION  05/2023   Back   BACK SURGERY  2002   L4-L5- fusion   BRONCHIAL BRUSHINGS  08/07/2019   Procedure: BRONCHIAL BRUSHINGS;  Surgeon: Gretta Leita SQUIBB, DO;  Location: [facility];  Service: Endoscopy;;   BRONCHIAL WASHINGS  08/07/2019   Procedure: BRONCHIAL WASHINGS;  Surgeon: Gretta Leita SQUIBB, DO;  Location:  [facility];  Service: Endoscopy;;   CATARACT EXTRACTION     COLONOSCOPY WITH PROPOFOL  N/A 03/17/2016   Procedure: COLONOSCOPY WITH PROPOFOL ;  Surgeon: Gladis MARLA Louder, MD;  Location: [facility];  Service: Endoscopy;  Laterality: N/A;   ESOPHAGOGASTRODUODENOSCOPY (EGD) WITH PROPOFOL  N/A 03/17/2016   Procedure: ESOPHAGOGASTRODUODENOSCOPY (EGD) WITH PROPOFOL ;  Surgeon: Gladis MARLA Louder, MD;  Location: [facility];  Service: Endoscopy;  Laterality: N/A;   JOINT REPLACEMENT     TONSILLECTOMY AND ADENOIDECTOMY     TOTAL HIP ARTHROPLASTY Right 03/25/2015   Procedure: TOTAL HIP ARTHROPLASTY ANTERIOR APPROACH;  Surgeon: Dempsey Sensor, MD;  Location: [facility];  Service: Orthopedics;  Laterality: Right;   VIDEO BRONCHOSCOPY N/A 08/07/2019   Procedure: VIDEO BRONCHOSCOPY WITHOUT FLUORO;  Surgeon: Gretta Leita SQUIBB, DO;  Location: [facility];  Service: Endoscopy;  Laterality: N/A;      Current Outpatient Medications:    albuterol  (VENTOLIN  HFA) 108 (90 Base) MCG/ACT inhaler, Inhale 1 puff into the lungs every 6 (six) hours as needed for wheezing or shortness of breath., Disp: 6.7 g, Rfl: 6   ALPHA LIPOIC ACID PO, Take by mouth., Disp: , Rfl:    Calcium Carb-Cholecalciferol (CALCIUM + D3 PO), Take 1 tablet by mouth daily., Disp: , Rfl:    Ferrous Sulfate (IRON) 325 (65 Fe) MG TABS, Take 1 tablet by mouth., Disp: , Rfl:    gabapentin  (NEURONTIN ) 400 MG capsule, Take 400 mg by mouth 2 (two) times daily., Disp: , Rfl:    Glycopyrrolate-Formoterol  (BEVESPI  AEROSPHERE) 9-4.8 MCG/ACT AERO, Inhale 2 puffs into the lungs 2 (two) times  daily., Disp: 10.7 g, Rfl: 11   HYDROcodone -acetaminophen  (NORCO/VICODIN) 5-325 MG tablet, Take 1 tablet by mouth 2 (two) times daily as needed., Disp: , Rfl:    levothyroxine  (SYNTHROID , LEVOTHROID) 75 MCG tablet, Take 75 mcg by mouth at bedtime., Disp: , Rfl:    pyridOXINE (VITAMIN B6) 50 MG tablet, Take 50 mg by mouth daily., Disp: , Rfl:    vitamin B-12 (CYANOCOBALAMIN) 500 MCG tablet, Take 500 mcg by mouth every evening., Disp: , Rfl:     Physical Exam: Blood pressure 122/62, pulse 77, height 5' 4 (1.626 m), weight 134 lb 3.2 oz (60.9 kg), SpO2 98%.    Affect appropriate Healthy:  appears stated age HEENT: normal Neck supple with no adenopathy JVP normal no bruits no thyromegaly Lungs clear with no wheezing and good diaphragmatic motion Heart:  S1/S2 preserved mild AS murmur, no rub, gallop or click PMI normal Abdomen: benighn, BS positve, no tenderness, no AAA no bruit.  No HSM or HJR Distal pulses intact with no bruits No edema Neuro non-focal Skin warm and dry Post right THR   Labs:   Lab Results  Component Value Date   WBC 4.9 09/22/2023   HGB 10.2 (L) 09/22/2023   HCT 30.5 (L) 09/22/2023   MCV 96.2 09/22/2023   PLT 323 09/22/2023      Radiology: OCT, Retina - OU - Both Eyes Result Date: 03/27/2024 Right Eye Quality was good. Central Foveal Thickness: 250. Progression has no prior data. Findings include normal foveal contour, no SRF, retinal drusen , pigment epithelial detachment, outer retinal atrophy (Scattered low PED-- focal cluster with ORA superior mac, focal intra retinal cyst temporal fovea). Left Eye Quality was good. Central Foveal Thickness: 245. Progression has no prior data. Findings include normal foveal contour, no IRF, no SRF, retinal drusen . Notes *Images captured and stored on drive Diagnosis / Impression: Nonexudative ARMD OU OD: Scattered low  PED-- focal cluster with ORA superior mac, focal intra retinal cyst temporal fovea OS: NFP; no IRF/SRF;  +drusen Clinical management: See below Abbreviations: NFP - Normal foveal profile. CME - cystoid macular edema. PED - pigment epithelial detachment. IRF - intraretinal fluid. SRF - subretinal fluid. EZ - ellipsoid zone. ERM - epiretinal membrane. ORA - outer retinal atrophy. ORT - outer retinal tubulation. SRHM - subretinal hyper-reflective material. IRHM - intraretinal hyper-reflective material     EKG: SR rate 63 normal ECG 03/13/15 06/28/19 SR rate 80 normal 04/20/2024 NSR rate 90 Normal    ASSESSMENT AND PLAN:   1. Aortic Stenosis:  gradients mild by TTE 02/08/24 despite low calculated DVI and AVA. No symptoms would f/u echo September 2026.  2. Neuropathy:  Continue gabapentin  f/u Dr Jenel Now using hydrocodone  as well 3. AAA: not clear this needs yearly US  f/u has diameter only 2.7 cm since 2018 f/u primary  4. Hypothyroidism :  On replacement labs with primary  5. Lipids:  F/u primary target LDL 70 or less 6. 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 Lupus:  primary involving skin   F/U September 2026 with TTE for AS    Signed: Maude Emmer 04/20/2024, 9:05 AM

## 2024-04-20 ENCOUNTER — Ambulatory Visit: Attending: Internal Medicine | Admitting: Cardiovascular Disease

## 2024-04-20 ENCOUNTER — Encounter: Payer: Self-pay | Admitting: Cardiovascular Disease

## 2024-04-20 VITALS — BP 122/62 | HR 77 | Ht 64.0 in | Wt 134.2 lb

## 2024-04-20 DIAGNOSIS — I714 Abdominal aortic aneurysm, without rupture, unspecified: Secondary | ICD-10-CM | POA: Insufficient documentation

## 2024-04-20 DIAGNOSIS — G629 Polyneuropathy, unspecified: Secondary | ICD-10-CM | POA: Diagnosis present

## 2024-04-20 DIAGNOSIS — I350 Nonrheumatic aortic (valve) stenosis: Secondary | ICD-10-CM | POA: Diagnosis present

## 2024-04-20 DIAGNOSIS — E782 Mixed hyperlipidemia: Secondary | ICD-10-CM | POA: Insufficient documentation

## 2024-04-20 NOTE — Patient Instructions (Signed)
 Medication Instructions:  Your physician recommends that you continue on your current medications as directed. Please refer to the Current Medication list given to you today.  *If you need a refill on your cardiac medications before your next appointment, please call your pharmacy*  Lab Work: NONE If you have labs (blood work) drawn today and your tests are completely normal, you will receive your results only by: MyChart Message (if you have MyChart) OR A paper copy in the mail If you have any lab test that is abnormal or we need to change your treatment, we will call you to review the results.  Testing/Procedures: Echocardiogram Sept 2026 Your physician has requested that you have an echocardiogram. Echocardiography is a painless test that uses sound waves to create images of your heart. It provides your doctor with information about the size and shape of your heart and how well your hearts chambers and valves are working. This procedure takes approximately one hour. There are no restrictions for this procedure. Please do NOT wear cologne, perfume, aftershave, or lotions (deodorant is allowed). Please arrive 15 minutes prior to your appointment time.  Please note: We ask at that you not bring children with you during ultrasound (echo/ vascular) testing. Due to room size and safety concerns, children are not allowed in the ultrasound rooms during exams. Our front office staff cannot provide observation of children in our lobby area while testing is being conducted. An adult accompanying a patient to their appointment will only be allowed in the ultrasound room at the discretion of the ultrasound technician under special circumstances. We apologize for any inconvenience.   Follow-Up: At [facility], you and your health needs are our priority.  As part of our continuing mission to provide you with exceptional heart care, our providers are all part of one team.  This team includes your  primary Cardiologist (physician) and Advanced Practice Providers or APPs (Physician Assistants and Nurse Practitioners) who all work together to provide you with the care you need, when you need it.  Your next appointment:   1 year(s)  Provider:   Maude Emmer, MD    We recommend signing up for the patient portal called MyChart.  Sign up information is provided on this After Visit Summary.  MyChart is used to connect with patients for Virtual Visits (Telemedicine).  Patients are able to view lab/test results, encounter notes, upcoming appointments, etc.  Non-urgent messages can be sent to your provider as well.   To learn more about what you can do with MyChart, go to [email or web address].

## 2024-04-21 ENCOUNTER — Encounter (INDEPENDENT_AMBULATORY_CARE_PROVIDER_SITE_OTHER): Admitting: Ophthalmology

## 2024-04-24 ENCOUNTER — Encounter (INDEPENDENT_AMBULATORY_CARE_PROVIDER_SITE_OTHER): Payer: Self-pay | Admitting: Ophthalmology

## 2024-04-24 ENCOUNTER — Ambulatory Visit (INDEPENDENT_AMBULATORY_CARE_PROVIDER_SITE_OTHER): Admitting: Ophthalmology

## 2024-04-24 DIAGNOSIS — H43813 Vitreous degeneration, bilateral: Secondary | ICD-10-CM | POA: Diagnosis not present

## 2024-04-24 DIAGNOSIS — Z961 Presence of intraocular lens: Secondary | ICD-10-CM

## 2024-04-24 DIAGNOSIS — H353132 Nonexudative age-related macular degeneration, bilateral, intermediate dry stage: Secondary | ICD-10-CM

## 2024-04-26 ENCOUNTER — Encounter (INDEPENDENT_AMBULATORY_CARE_PROVIDER_SITE_OTHER): Payer: Self-pay | Admitting: Ophthalmology

## 2024-05-05 ENCOUNTER — Ambulatory Visit
Admission: RE | Admit: 2024-05-05 | Discharge: 2024-05-05 | Disposition: A | Discharge status: Home / Self Care | Source: Ambulatory Visit | Attending: Physical Medicine and Rehabilitation | Admitting: Physical Medicine and Rehabilitation

## 2024-05-05 DIAGNOSIS — M4807 Spinal stenosis, lumbosacral region: Secondary | ICD-10-CM

## 2024-05-05 DIAGNOSIS — M545 Low back pain, unspecified: Secondary | ICD-10-CM

## 2024-05-05 MED ORDER — GADOPICLENOL 0.5 MMOL/ML IV SOLN
6.0000 mL | Freq: Once | INTRAVENOUS | Status: AC | PRN
  Administered 2024-05-05: 6 mL via INTRAVENOUS

## 2024-08-30 ENCOUNTER — Encounter (INDEPENDENT_AMBULATORY_CARE_PROVIDER_SITE_OTHER): Admitting: Ophthalmology

## 2025-01-18 ENCOUNTER — Ambulatory Visit (HOSPITAL_COMMUNITY)

## 2025-04-19 ENCOUNTER — Ambulatory Visit: Admitting: Rheumatology
